# Patient Record
Sex: Female | Born: 1985
Health system: Southern US, Community
[De-identification: ages and names within clinical notes are randomized; demographics above are authoritative.]

## PROBLEM LIST (undated history)

## (undated) ENCOUNTER — Inpatient Hospital Stay (HOSPITAL_COMMUNITY): Payer: Self-pay

## (undated) DIAGNOSIS — E669 Obesity, unspecified: Secondary | ICD-10-CM

## (undated) DIAGNOSIS — N809 Endometriosis, unspecified: Secondary | ICD-10-CM

## (undated) DIAGNOSIS — F32A Depression, unspecified: Secondary | ICD-10-CM

## (undated) DIAGNOSIS — G4730 Sleep apnea, unspecified: Secondary | ICD-10-CM

## (undated) DIAGNOSIS — I10 Essential (primary) hypertension: Secondary | ICD-10-CM

## (undated) DIAGNOSIS — F419 Anxiety disorder, unspecified: Secondary | ICD-10-CM

## (undated) DIAGNOSIS — D219 Benign neoplasm of connective and other soft tissue, unspecified: Secondary | ICD-10-CM

## (undated) DIAGNOSIS — N390 Urinary tract infection, site not specified: Secondary | ICD-10-CM

## (undated) DIAGNOSIS — N83201 Unspecified ovarian cyst, right side: Secondary | ICD-10-CM

## (undated) DIAGNOSIS — D573 Sickle-cell trait: Secondary | ICD-10-CM

## (undated) DIAGNOSIS — Z8742 Personal history of other diseases of the female genital tract: Secondary | ICD-10-CM

## (undated) DIAGNOSIS — Z8619 Personal history of other infectious and parasitic diseases: Secondary | ICD-10-CM

## (undated) HISTORY — DX: Sleep apnea, unspecified: G47.30

## (undated) HISTORY — DX: Obesity, unspecified: E66.9

## (undated) HISTORY — PX: LAPAROTOMY, SALPINGO-OOPHORECTOMY: SHX4643

## (undated) HISTORY — PX: LAPAROSCOPIC, EXCISION, ENDOMETRIOSIS: SHX00263

## (undated) HISTORY — DX: Urinary tract infection, site not specified: N39.0

## (undated) HISTORY — DX: Anxiety disorder, unspecified: F41.9

---

## 2010-05-10 ENCOUNTER — Emergency Department (HOSPITAL_COMMUNITY): Admission: EM | Admit: 2010-05-10 | Discharge: 2010-05-11 | Payer: Self-pay | Admitting: Emergency Medicine

## 2010-12-17 LAB — URINALYSIS, ROUTINE W REFLEX MICROSCOPIC
Bilirubin Urine: NEGATIVE
Glucose, UA: NEGATIVE mg/dL
Hgb urine dipstick: NEGATIVE
Ketones, ur: NEGATIVE mg/dL
Nitrite: NEGATIVE
Protein, ur: NEGATIVE mg/dL
Specific Gravity, Urine: 1.017 (ref 1.005–1.030)
Urobilinogen, UA: 0.2 mg/dL (ref 0.0–1.0)
pH: 5.5 (ref 5.0–8.0)

## 2010-12-17 LAB — POCT I-STAT, CHEM 8
BUN: 14 mg/dL (ref 6–23)
Calcium, Ion: 1.27 mmol/L (ref 1.12–1.32)
Chloride: 104 mEq/L (ref 96–112)
Creatinine, Ser: 0.9 mg/dL (ref 0.4–1.2)
Glucose, Bld: 91 mg/dL (ref 70–99)
HCT: 42 % (ref 36.0–46.0)
Hemoglobin: 14.3 g/dL (ref 12.0–15.0)
Potassium: 3.9 mEq/L (ref 3.5–5.1)
Sodium: 140 mEq/L (ref 135–145)
TCO2: 26 mmol/L (ref 0–100)

## 2011-10-04 HISTORY — PX: LAPAROSCOPIC, SALPINGO-OOPHORECTOMY: SHX5768

## 2012-07-09 ENCOUNTER — Ambulatory Visit: Payer: Self-pay | Admitting: Medical

## 2012-07-13 ENCOUNTER — Encounter: Payer: Self-pay | Admitting: Medical

## 2012-07-13 ENCOUNTER — Ambulatory Visit (INDEPENDENT_AMBULATORY_CARE_PROVIDER_SITE_OTHER): Payer: 59 | Admitting: Medical

## 2012-07-13 VITALS — BP 120/82 | HR 60 | Temp 98.2°F | Resp 16 | Ht 66.0 in | Wt 230.0 lb

## 2012-07-13 DIAGNOSIS — F411 Generalized anxiety disorder: Secondary | ICD-10-CM

## 2012-07-13 DIAGNOSIS — R61 Generalized hyperhidrosis: Secondary | ICD-10-CM

## 2012-07-13 DIAGNOSIS — L74519 Primary focal hyperhidrosis, unspecified: Secondary | ICD-10-CM

## 2012-07-13 DIAGNOSIS — F419 Anxiety disorder, unspecified: Secondary | ICD-10-CM

## 2012-07-13 MED ORDER — CITALOPRAM HYDROBROMIDE 20 MG PO TABS: ORAL_TABLET | ORAL | Status: DC

## 2012-07-13 MED ORDER — ALUMINUM CHLORIDE 20 % EX SOLN: Freq: Every day | CUTANEOUS | Status: DC
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
## 2012-07-13 NOTE — Patient Instructions (Signed)

## 2012-07-19 ENCOUNTER — Encounter: Payer: Self-pay | Admitting: Internal Medicine

## 2012-07-27 ENCOUNTER — Encounter: Payer: Self-pay | Admitting: Medical

## 2012-07-31 ENCOUNTER — Encounter: Payer: Self-pay | Admitting: Medical

## 2012-07-31 ENCOUNTER — Other Ambulatory Visit (HOSPITAL_COMMUNITY)
Admission: RE | Admit: 2012-07-31 | Discharge: 2012-07-31 | Disposition: A | Discharge status: Home / Self Care | Payer: 59 | Source: Ambulatory Visit | Attending: Medical | Admitting: Medical

## 2012-07-31 ENCOUNTER — Ambulatory Visit (INDEPENDENT_AMBULATORY_CARE_PROVIDER_SITE_OTHER): Payer: 59 | Admitting: Medical

## 2012-07-31 VITALS — BP 120/82 | HR 60 | Temp 98.6°F | Resp 16 | Ht 66.0 in | Wt 229.0 lb

## 2012-07-31 DIAGNOSIS — Z124 Encounter for screening for malignant neoplasm of cervix: Secondary | ICD-10-CM

## 2012-07-31 DIAGNOSIS — F411 Generalized anxiety disorder: Secondary | ICD-10-CM

## 2012-07-31 DIAGNOSIS — R61 Generalized hyperhidrosis: Secondary | ICD-10-CM

## 2012-07-31 DIAGNOSIS — R5381 Other malaise: Secondary | ICD-10-CM

## 2012-07-31 DIAGNOSIS — N898 Other specified noninflammatory disorders of vagina: Secondary | ICD-10-CM

## 2012-07-31 DIAGNOSIS — E669 Obesity, unspecified: Secondary | ICD-10-CM

## 2012-07-31 DIAGNOSIS — L74519 Primary focal hyperhidrosis, unspecified: Secondary | ICD-10-CM

## 2012-07-31 DIAGNOSIS — R5383 Other fatigue: Secondary | ICD-10-CM

## 2012-07-31 DIAGNOSIS — Z113 Encounter for screening for infections with a predominantly sexual mode of transmission: Secondary | ICD-10-CM | POA: Insufficient documentation

## 2012-07-31 DIAGNOSIS — Z0000 Encounter for general adult medical examination without abnormal findings: Secondary | ICD-10-CM

## 2012-07-31 DIAGNOSIS — Z01419 Encounter for gynecological examination (general) (routine) without abnormal findings: Secondary | ICD-10-CM | POA: Insufficient documentation

## 2012-07-31 DIAGNOSIS — Z23 Encounter for immunization: Secondary | ICD-10-CM

## 2012-07-31 DIAGNOSIS — F419 Anxiety disorder, unspecified: Secondary | ICD-10-CM

## 2012-07-31 LAB — POCT URINALYSIS DIPSTICK
Bilirubin, UA: NEGATIVE
Blood, UA: NEGATIVE
Glucose, UA: NEGATIVE
Ketones, UA: NEGATIVE
Leukocytes, UA: NEGATIVE
Nitrite, UA: NEGATIVE
Protein, UA: NEGATIVE
Spec Grav, UA: 1.015
Urobilinogen, UA: NEGATIVE
pH, UA: 5

## 2012-07-31 MED ORDER — FLUCONAZOLE 150 MG PO TABS: ORAL_TABLET | ORAL | Status: DC

## 2012-07-31 MED ORDER — SERTRALINE HCL 25 MG PO TABS: ORAL_TABLET | ORAL | Status: DC

## 2012-07-31 NOTE — Patient Instructions (Signed)

## 2012-07-31 NOTE — Progress Notes (Signed)
Subjective:   HPI  Frances Hill is a 26 y.o. female who presents for a complete physical.  Also here for recheck on medications from recent first visit here.  Works as a Child psychotherapist, gets some exercise.    Anxiety - history of poor relationship with father, [facility] my suggestion.  Didn't tolerate citalopram due to lethargy.  She does take B12 in general for fatigue.     Recent white vaginal discharge x 2 wk, thick.  Has hx/o BV and yeast.  Not currently sexually active.  Using nothing for symptoms.   Has been treated various times for both BV and yeast.    LMP 07/11/12.  Periods are regular, no c/o, lasts 3-4 days.  No prior pregnancies.   No current relationship. Has been on Ortho Tri cyclen prior, but no desire to be on OCPs at this time.  Reviewed their medical, surgical, family, social, medication, and allergy history and updated chart as appropriate.  Past Medical History  Diagnosis Date  . Sexually transmitted disease (STD)     hx/o trichomonas  . Anxiety   . UTI (urinary tract infection) 2007    hospitalization  . Hyperhydrosis disorder     axilla  . Bacterial vaginosis     history of    History reviewed. No pertinent past surgical history.  Family History  Problem Relation Age of Onset  . Cancer Mother 40    breast  . Aneurysm Mother 53    died of aneurysm  . Hypertension Father   . Depression Father   . Heart disease Father     valve disease  . Cancer Maternal Aunt     breast cancer  . Diabetes Neg Hx   . Stroke Neg Hx     History   Social History  . Marital Status: Single    Spouse Name: N/A    Number of Children: N/A  . Years of Education: N/A   Occupational History  . Not on file.   Social History Main Topics  . Smoking status: Never Smoker   . Smokeless tobacco: Not on file  . Alcohol Use: 0.6 oz/week    1 Cans of beer per week  . Drug Use: No  . Sexually Active: Not on file   Other Topics  Concern  . 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 aluminum chloride (DRYSOL) 20 % external solution Apply topically at bedtime.  35 mL  3  . sertraline (ZOLOFT) 25 MG tablet 1/2 tablet po daily QHS x 1 wk, then 1 tablet QHS  30 tablet  2    No Known Allergies  Review of Systems Constitutional: -fever, -chills, -sweats, -unexpected weight change, -anorexia, -fatigue Allergy: -sneezing, -itching, -congestion Dermatology: denies changing moles, rash, lumps, new worrisome lesions ENT: -runny nose, -ear pain, -sore throat, -hoarseness, -sinus pain, +teeth pain, -tinnitus, -hearing loss, -epistaxis Cardiology:  -chest pain, -palpitations, -edema, -orthopnea, -paroxysmal nocturnal dyspnea Respiratory: -cough, -shortness of breath, -dyspnea on exertion, -wheezing, -hemoptysis Gastroenterology: -abdominal pain, -nausea, -vomiting, -diarrhea, -constipation, -blood in stool, -changes in bowel movement, -dysphagia Hematology: -bleeding or bruising problems Musculoskeletal: -arthralgias, -myalgias, -joint swelling, -back pain, -neck pain, -cramping, -gait changes Ophthalmology: -vision changes, -eye redness, -itching, -discharge Urology: -dysuria, -difficulty urinating, -hematuria, -urinary frequency, -urgency,  incontinence Neurology: -headache, -weakness, -tingling, -numbness, -speech abnormality, -memory loss, -falls, -dizziness Psychology:  +depressed mood, -agitation, -sleep problems     Objective:   Physical Exam  Filed Vitals:   07/31/12 1426  BP: 120/82  Pulse: 60  Temp: 98.6 F (37 C)  Resp: 16    General appearance: alert, no distress, WD/WN, AA female Skin: unremarkable, no worrisome lesions HEENT: normocephalic, conjunctiva/corneas normal, sclerae anicteric, PERRLA, EOMi, nares  patent, no discharge or erythema, pharynx normal Oral cavity: MMM, tongue normal, teeth normal appearing Neck: supple, no lymphadenopathy, no thyromegaly, no masses, normal ROM, no bruits Chest: non tender, normal shape and expansion Heart: RRR, normal S1, S2, no murmurs Lungs: CTA bilaterally, no wheezes, rhonchi, or rales Abdomen: +bs, soft, non tender, non distended, no masses, no hepatomegaly, no splenomegaly, no bruits Back: non tender, normal ROM, no scoliosis Musculoskeletal: upper extremities non tender, no obvious deformity, normal ROM throughout, lower extremities non tender, no obvious deformity, normal ROM throughout Extremities: no edema, no cyanosis, no clubbing Pulses: 2+ symmetric, upper and lower extremities, normal cap refill Neurological: alert, oriented x 3, CN2-12 intact, strength normal upper extremities and lower extremities, sensation normal throughout, DTRs 2+ throughout, no cerebellar signs, gait normal Psychiatric: normal affect, behavior normal, pleasant  Breast: nontender, no masses or lumps, no skin changes, no nipple discharge or inversion, no axillary lymphadenopathy Gyn: Normal external genitalia without lesions, vagina with normal mucosa, cervix without lesions, no cervical motion tenderness, thick white abnormal vaginal discharge.  Uterus and adnexa not enlarged, nontender, no masses.  Pap performed.  Exam chaperoned by nurse. Rectal: deferred    Assessment and Plan :    Encounter Diagnoses  Name Primary?  . Routine general medical examination at a health care facility Yes  . Anxiety   . Screen for STD (sexually transmitted disease)   . Screening for cervical cancer   . Obesity   . Hyperhydrosis disorder   . Need for HPV vaccination   . Fatigue   . Vaginal discharge    Physical exam - discussed healthy lifestyle, diet, exercise, preventative care, vaccinations, and addressed their concerns.  Handout given.  Up to date on Tdap 2011, declines flu  vaccine.  See dentist yearly.  Anxiety - didn't tolerate citalopram.  Begin trial of Zoloft instead.  Recheck 40mo on Zoloft.  Begins counseling with [facility] tomorrow.  Screen for STD - labs sent, discussed safe sex  Screen for cervical cancer - pap sent  Obesity - discussed lifestyle changes, weight loss recommendations  Hyper hydrosis - improved significantly on Drysol  HPV vaccine given today #1, VIS and counseling given.  fatigue - labs today, increase exercise  Vaginal discharge - wet prep with lots of bacteria, rods, no trichomonads or obvious clue cells.  There is yeast though.  We will treat for yeast infection.  Follow-up pending labs

## 2012-08-01 LAB — CBC WITH DIFFERENTIAL/PLATELET
Basophils Absolute: 0 10*3/uL (ref 0.0–0.1)
Basophils Relative: 0 % (ref 0–1)
Eosinophils Absolute: 0 10*3/uL (ref 0.0–0.7)
Eosinophils Relative: 0 % (ref 0–5)
HCT: 39.6 % (ref 36.0–46.0)
Hemoglobin: 13.4 g/dL (ref 12.0–15.0)
Lymphocytes Relative: 30 % (ref 12–46)
Lymphs Abs: 2.1 10*3/uL (ref 0.7–4.0)
MCH: 25.6 pg — ABNORMAL LOW (ref 26.0–34.0)
MCHC: 33.8 g/dL (ref 30.0–36.0)
MCV: 75.7 fL — ABNORMAL LOW (ref 78.0–100.0)
Monocytes Absolute: 0.7 10*3/uL (ref 0.1–1.0)
Monocytes Relative: 10 % (ref 3–12)
Neutro Abs: 4.1 10*3/uL (ref 1.7–7.7)
Neutrophils Relative %: 60 % (ref 43–77)
Platelets: 312 10*3/uL (ref 150–400)
RBC: 5.23 MIL/uL — ABNORMAL HIGH (ref 3.87–5.11)
RDW: 14.1 % (ref 11.5–15.5)
WBC: 6.9 10*3/uL (ref 4.0–10.5)

## 2012-08-01 LAB — VITAMIN B12
Vitamin B-12: 405 pg/mL (ref 211–911)
Vitamin B-12: 419 pg/mL (ref 211–911)

## 2012-08-01 LAB — COMPREHENSIVE METABOLIC PANEL
ALT: 11 U/L (ref 0–35)
AST: 15 U/L (ref 0–37)
Albumin: 4.1 g/dL (ref 3.5–5.2)
Alkaline Phosphatase: 52 U/L (ref 39–117)
BUN: 9 mg/dL (ref 6–23)
CO2: 24 mEq/L (ref 19–32)
Calcium: 9.5 mg/dL (ref 8.4–10.5)
Chloride: 103 mEq/L (ref 96–112)
Creat: 0.82 mg/dL (ref 0.50–1.10)
Glucose, Bld: 74 mg/dL (ref 70–99)
Potassium: 4.2 mEq/L (ref 3.5–5.3)
Sodium: 135 mEq/L (ref 135–145)
Total Bilirubin: 0.4 mg/dL (ref 0.3–1.2)
Total Protein: 7 g/dL (ref 6.0–8.3)

## 2012-08-01 LAB — LIPID PANEL
Cholesterol: 168 mg/dL (ref 0–200)
HDL: 46 mg/dL (ref >39)
LDL Cholesterol: 107 mg/dL — ABNORMAL HIGH (ref 0–99)
Total CHOL/HDL Ratio: 3.7 Ratio
Triglycerides: 73 mg/dL (ref <150)
VLDL: 15 mg/dL (ref 0–40)

## 2012-08-01 LAB — TSH: TSH: 1.394 u[IU]/mL (ref 0.350–4.500)

## 2012-08-01 LAB — RPR

## 2012-08-01 LAB — HIV ANTIBODY (ROUTINE TESTING W REFLEX): HIV: NONREACTIVE

## 2012-12-04 ENCOUNTER — Ambulatory Visit (INDEPENDENT_AMBULATORY_CARE_PROVIDER_SITE_OTHER): Payer: 59 | Admitting: Family Medicine

## 2012-12-04 ENCOUNTER — Encounter: Payer: Self-pay | Admitting: Family Medicine

## 2012-12-04 VITALS — BP 124/80 | HR 84 | Temp 98.5°F | Wt 234.0 lb

## 2012-12-04 DIAGNOSIS — N912 Amenorrhea, unspecified: Secondary | ICD-10-CM

## 2012-12-04 DIAGNOSIS — A088 Other specified intestinal infections: Secondary | ICD-10-CM

## 2012-12-04 DIAGNOSIS — A084 Viral intestinal infection, unspecified: Secondary | ICD-10-CM

## 2012-12-04 LAB — HCG, SERUM, QUALITATIVE: Preg, Serum: NEGATIVE

## 2012-12-04 MED ORDER — ONDANSETRON HCL 4 MG PO TABS: 4.0000 mg | ORAL_TABLET | Freq: Three times a day (TID) | ORAL | Status: DC | PRN

## 2012-12-04 NOTE — Patient Instructions (Addendum)
First try Emetrol and use this as needed and if that doesn't work did get the prescription filled For the diarrhea he use Imodium which is over-the-counter.

## 2012-12-04 NOTE — Progress Notes (Signed)
  Subjective:    Patient ID: Frances Hill, female    DOB: 1985-10-11, 27 y.o.   MRN: [id number]  HPI She has a three-day history of nausea, vomiting x8, diarrhea,2/3 her day. No fever or chills, abdominal pain. No one else around her has had any illnesses. She cannot relate this to any particular food or places she has eaten. She has not tried anything to treat this. She is concerned over possible pregnancy. Her last menstrual cycle was one month ago . No birth control was used.   Review of Systems     Objective:   Physical Exam alert and in no distress. Tympanic membranes and canals are normal. Throat is clear. Tonsils are normal. Neck is supple without adenopathy or thyromegaly. Cardiac exam shows a regular sinus rhythm without murmurs or gallops. Lungs are clear to auscultation.        Assessment & Plan:  Viral gastroenteritis - Plan: ondansetron (ZOFRAN) 4 MG tablet  Amenorrhea - Plan: hCG, serum, qualitative  Recommend she try Emetrol first and then if no benefit use Zofran. Discussed the need for using some form of birth control.

## 2012-12-05 NOTE — Progress Notes (Signed)
Quick Note:  Called pt only # left message that test was neg. ______

## 2013-08-25 ENCOUNTER — Emergency Department (HOSPITAL_COMMUNITY)
Admission: EM | Admit: 2013-08-25 | Discharge: 2013-08-26 | Disposition: A | Discharge status: Home / Self Care | Payer: 59 | Attending: Emergency Medicine | Admitting: Emergency Medicine

## 2013-08-25 ENCOUNTER — Encounter (HOSPITAL_COMMUNITY): Payer: Self-pay | Admitting: Emergency Medicine

## 2013-08-25 DIAGNOSIS — R197 Diarrhea, unspecified: Secondary | ICD-10-CM | POA: Insufficient documentation

## 2013-08-25 DIAGNOSIS — B373 Candidiasis of vulva and vagina: Secondary | ICD-10-CM | POA: Insufficient documentation

## 2013-08-25 DIAGNOSIS — Z3202 Encounter for pregnancy test, result negative: Secondary | ICD-10-CM | POA: Insufficient documentation

## 2013-08-25 DIAGNOSIS — Z8619 Personal history of other infectious and parasitic diseases: Secondary | ICD-10-CM | POA: Insufficient documentation

## 2013-08-25 DIAGNOSIS — B3731 Acute candidiasis of vulva and vagina: Secondary | ICD-10-CM | POA: Insufficient documentation

## 2013-08-25 DIAGNOSIS — F411 Generalized anxiety disorder: Secondary | ICD-10-CM | POA: Insufficient documentation

## 2013-08-25 DIAGNOSIS — Z87440 Personal history of urinary (tract) infections: Secondary | ICD-10-CM | POA: Insufficient documentation

## 2013-08-25 DIAGNOSIS — R112 Nausea with vomiting, unspecified: Secondary | ICD-10-CM | POA: Insufficient documentation

## 2013-08-25 LAB — URINALYSIS, ROUTINE W REFLEX MICROSCOPIC
Bilirubin Urine: NEGATIVE
Glucose, UA: NEGATIVE mg/dL
Hgb urine dipstick: NEGATIVE
Ketones, ur: NEGATIVE mg/dL
Leukocytes, UA: NEGATIVE
Nitrite: NEGATIVE
Protein, ur: NEGATIVE mg/dL
Specific Gravity, Urine: 1.018 (ref 1.005–1.030)
Urobilinogen, UA: 0.2 mg/dL (ref 0.0–1.0)
pH: 8 (ref 5.0–8.0)

## 2013-08-25 LAB — POCT PREGNANCY, URINE: Preg Test, Ur: NEGATIVE

## 2013-08-25 MED ORDER — ONDANSETRON 4 MG PO TBDP
8.0000 mg | ORAL_TABLET | Freq: Once | ORAL | Status: AC
  Administered 2013-08-25: 8 mg via ORAL

## 2013-08-25 NOTE — ED Notes (Addendum)
Presents with onset of cramping lower abdominal pain began this am associated with nausea. Sitting up makes pain worse, fetal position makes pain better. Pain is described as sharp and cramping. LMP 10/22, denies vaginal bleeding and vaginal discharge, dneis urinary symptoms. Pt is tearful. Pain 10/10.  Took home pregnancy tests that were both negative today.

## 2013-08-26 LAB — WET PREP, GENITAL
Trich, Wet Prep: NONE SEEN
Yeast Wet Prep HPF POC: NONE SEEN

## 2013-08-26 MED ORDER — KETOROLAC TROMETHAMINE 30 MG/ML IJ SOLN
60.0000 mg | Freq: Once | INTRAMUSCULAR | Status: AC
  Administered 2013-08-26: 60 mg via INTRAMUSCULAR
  Filled 2013-08-26: qty 2

## 2013-08-26 MED ORDER — ONDANSETRON 8 MG PO TBDP: 8.0000 mg | ORAL_TABLET | Freq: Three times a day (TID) | ORAL | Status: DC | PRN

## 2013-08-26 MED ORDER — FLUCONAZOLE 100 MG PO TABS
150.0000 mg | ORAL_TABLET | Freq: Once | ORAL | Status: AC
  Administered 2013-08-26: 150 mg via ORAL
  Filled 2013-08-26: qty 2

## 2013-08-26 MED ORDER — TRAMADOL HCL 50 MG PO TABS: 50.0000 mg | ORAL_TABLET | Freq: Four times a day (QID) | ORAL | Status: DC | PRN

## 2013-08-26 MED ORDER — PROMETHAZINE HCL 25 MG PO TABS: 25.0000 mg | ORAL_TABLET | Freq: Four times a day (QID) | ORAL | Status: DC | PRN

## 2013-08-26 MED ORDER — PROMETHAZINE HCL 25 MG/ML IJ SOLN
25.0000 mg | Freq: Once | INTRAMUSCULAR | Status: AC
  Administered 2013-08-26: 25 mg via INTRAMUSCULAR
  Filled 2013-08-26: qty 1

## 2013-08-26 MED ORDER — PROMETHAZINE HCL 25 MG RE SUPP: 25.0000 mg | Freq: Four times a day (QID) | RECTAL | Status: DC | PRN

## 2013-08-26 NOTE — ED Provider Notes (Signed)
CSN: [id number]     Arrival date & time 08/25/13  2018 History   First MD Initiated Contact with Patient 08/25/13 2329     Chief Complaint  Patient presents with  . Abdominal Pain   (Consider location/radiation/quality/duration/timing/severity/associated sxs/prior Treatment) HPI 27 year old female presents emergency department with complaint of nausea, vomiting, and diarrhea, along with lower abdominal cramping.  Patient reports she is due for her period any day.  She is taken home pregnancy tests may have been negative.  No fevers no chills.  Patient, reports she's been unable to keep anything down.  No sick contacts.  No travel, no unusual foods.  She reports thick white discharge with slight itching.  No new sexual partners. Past Medical History  Diagnosis Date  . Sexually transmitted disease (STD)     hx/o trichomonas  . Anxiety   . UTI (urinary tract infection) 2007    hospitalization  . Hyperhydrosis disorder     axilla  . Bacterial vaginosis     history of   History reviewed. No pertinent past surgical history. Family History  Problem Relation Age of Onset  . Cancer Mother 63    breast  . Aneurysm Mother 69    died of aneurysm  . Hypertension Father   . Depression Father   . Heart disease Father     valve disease  . Cancer Maternal Aunt     breast cancer  . Diabetes Neg Hx   . Stroke Neg Hx    History  Substance Use Topics  . Smoking status: Never Smoker   . Smokeless tobacco: Not on file  . Alcohol Use: 0.6 oz/week    1 Cans of beer per week   OB History   Grav Para Term Preterm Abortions TAB SAB Ect Mult Living                 Review of Systems  All other systems reviewed and are negative.    Allergies  Review of patient's allergies indicates no known allergies.  Home Medications   Current Outpatient Rx  Name  Route  Sig  Dispense  Refill  . Naproxen Sodium (ALEVE) 220 MG CAPS   Oral   Take 220 mg by mouth every 6 (six) hours as needed  (PAIN).         Marland Kitchen ondansetron (ZOFRAN) 4 MG tablet   Oral   Take 1 tablet (4 mg total) by mouth every 8 (eight) hours as needed for nausea.   12 tablet   0   . ondansetron (ZOFRAN ODT) 8 MG disintegrating tablet   Oral   Take 1 tablet (8 mg total) by mouth every 8 (eight) hours as needed for nausea or vomiting.   20 tablet   0   . promethazine (PHENERGAN) 25 MG suppository   Rectal   Place 1 suppository (25 mg total) rectally every 6 (six) hours as needed for nausea or vomiting.   12 each   0   . promethazine (PHENERGAN) 25 MG tablet   Oral   Take 1 tablet (25 mg total) by mouth every 6 (six) hours as needed for nausea.   30 tablet   0   . traMADol (ULTRAM) 50 MG tablet   Oral   Take 1 tablet (50 mg total) by mouth every 6 (six) hours as needed for moderate pain.   15 tablet   0    BP 135/82  Pulse 82  Temp(Src) 99.6 F (37.6 C) (Oral)  Resp 18  Wt 245 lb 14.4 oz (111.54 kg)  SpO2 100% Physical Exam  Nursing note and vitals reviewed. Constitutional: She is oriented to person, place, and time. She appears well-developed and well-nourished.  HENT:  Head: Normocephalic and atraumatic.  Nose: Nose normal.  Mouth/Throat: Oropharynx is clear and moist.  Eyes: Conjunctivae and EOM are normal. Pupils are equal, round, and reactive to light.  Neck: Normal range of motion. Neck supple. No JVD present. No tracheal deviation present. No thyromegaly present.  Cardiovascular: Normal rate, regular rhythm, normal heart sounds and intact distal pulses.  Exam reveals no gallop and no friction rub.   No murmur heard. Pulmonary/Chest: Effort normal and breath sounds normal. No stridor. No respiratory distress. She has no wheezes. She has no rales. She exhibits no tenderness.  Abdominal: Soft. Bowel sounds are normal. She exhibits no distension and no mass. There is tenderness (mild suprapubic and lower pelvic tenderness with palpation). There is no rebound and no guarding.   Genitourinary:  External genitalia normal Vagina with thick, white, adherent discharge Cervix closed no lesions No cervical motion tenderness Adnexa palpated, no masses or tenderness noted Bladder palpated no tenderness Uterus palpated no masses but moderate tenderness    Musculoskeletal: Normal range of motion. She exhibits no edema and no tenderness.  Lymphadenopathy:    She has no cervical adenopathy.  Neurological: She is alert and oriented to person, place, and time. She exhibits normal muscle tone. Coordination normal.  Skin: Skin is warm and dry. No rash noted. No erythema. No pallor.  Psychiatric: She has a normal mood and affect. Her behavior is normal. Judgment and thought content normal.    ED Course  Procedures (including critical care time) Labs Review Labs Reviewed  WET PREP, GENITAL  GC/CHLAMYDIA PROBE AMP  URINALYSIS, ROUTINE W REFLEX MICROSCOPIC  POCT PREGNANCY, URINE   Imaging Review No results found.  EKG Interpretation   None       MDM   1. Nausea vomiting and diarrhea   2. Vaginal candidiasis    27 year old female with nausea, vomiting, and diarrhea.  This maybe do to a viral gastroenteritis.  Clinically, she has a yeast infection, will treat with Diflucan. Samples are currently misplaced, but they should be be able to recover them for GC and Chlamydia.  Patient has tolerated crackers and ginger ale here.  We'll discharge home to followup with PCP    Olivia Mackie, MD 08/26/13 [phone]

## 2013-08-27 LAB — GC/CHLAMYDIA PROBE AMP
CT Probe RNA: NEGATIVE
GC Probe RNA: NEGATIVE

## 2013-09-16 ENCOUNTER — Ambulatory Visit (INDEPENDENT_AMBULATORY_CARE_PROVIDER_SITE_OTHER): Payer: 59 | Admitting: Medical

## 2013-09-16 ENCOUNTER — Encounter: Payer: Self-pay | Admitting: Medical

## 2013-09-16 VITALS — BP 110/80 | HR 78 | Temp 98.2°F | Resp 18 | Ht 66.0 in | Wt 243.0 lb

## 2013-09-16 DIAGNOSIS — N898 Other specified noninflammatory disorders of vagina: Secondary | ICD-10-CM

## 2013-09-16 DIAGNOSIS — Z0000 Encounter for general adult medical examination without abnormal findings: Secondary | ICD-10-CM

## 2013-09-16 DIAGNOSIS — Z803 Family history of malignant neoplasm of breast: Secondary | ICD-10-CM

## 2013-09-16 DIAGNOSIS — E669 Obesity, unspecified: Secondary | ICD-10-CM

## 2013-09-16 DIAGNOSIS — Z23 Encounter for immunization: Secondary | ICD-10-CM

## 2013-09-16 DIAGNOSIS — M2550 Pain in unspecified joint: Secondary | ICD-10-CM

## 2013-09-16 DIAGNOSIS — Z309 Encounter for contraceptive management, unspecified: Secondary | ICD-10-CM

## 2013-09-16 DIAGNOSIS — Z113 Encounter for screening for infections with a predominantly sexual mode of transmission: Secondary | ICD-10-CM

## 2013-09-16 LAB — POCT URINALYSIS DIPSTICK
Bilirubin, UA: NEGATIVE
Blood, UA: NEGATIVE
Glucose, UA: NEGATIVE
Ketones, UA: NEGATIVE
Leukocytes, UA: NEGATIVE
Nitrite, UA: NEGATIVE
Protein, UA: NEGATIVE
Spec Grav, UA: <=1.005
Urobilinogen, UA: NEGATIVE
pH, UA: 5

## 2013-09-16 LAB — POCT WET PREP (WET MOUNT)
Clue Cells Wet Prep Whiff POC: NEGATIVE
KOH Wet Prep POC: NEGATIVE
Trichomonas Wet Prep HPF POC: NEGATIVE

## 2013-09-16 LAB — BASIC METABOLIC PANEL
BUN: 11 mg/dL (ref 6–23)
CO2: 27 mEq/L (ref 19–32)
Calcium: 10 mg/dL (ref 8.4–10.5)
Chloride: 101 mEq/L (ref 96–112)
Creat: 0.88 mg/dL (ref 0.50–1.10)
Glucose, Bld: 87 mg/dL (ref 70–99)
Potassium: 4 mEq/L (ref 3.5–5.3)
Sodium: 133 mEq/L — ABNORMAL LOW (ref 135–145)

## 2013-09-16 LAB — HEMOGLOBIN A1C
Hgb A1c MFr Bld: 5.8 % — ABNORMAL HIGH (ref <5.7)
Mean Plasma Glucose: 120 mg/dL — ABNORMAL HIGH (ref <117)

## 2013-09-16 LAB — RPR

## 2013-09-16 LAB — CBC
HCT: 41 % (ref 36.0–46.0)
Hemoglobin: 13.7 g/dL (ref 12.0–15.0)
MCH: 24.9 pg — ABNORMAL LOW (ref 26.0–34.0)
MCHC: 33.4 g/dL (ref 30.0–36.0)
MCV: 74.4 fL — ABNORMAL LOW (ref 78.0–100.0)
Platelets: 344 10*3/uL (ref 150–400)
RBC: 5.51 MIL/uL — ABNORMAL HIGH (ref 3.87–5.11)
RDW: 14.5 % (ref 11.5–15.5)
WBC: 5.5 10*3/uL (ref 4.0–10.5)

## 2013-09-16 LAB — POCT URINE PREGNANCY: Preg Test, Ur: NEGATIVE

## 2013-09-16 MED ORDER — NORGESTIM-ETH ESTRAD TRIPHASIC 0.18/0.215/0.25 MG-25 MCG PO TABS: 1.0000 | ORAL_TABLET | Freq: Every day | ORAL | Status: DC

## 2013-09-16 NOTE — Addendum Note (Signed)
Addended by: Leretha Dykes L on: 09/16/2013 12:40 PM   Modules accepted: Orders

## 2013-09-16 NOTE — Progress Notes (Signed)
Subjective:   HPI  Frances Hill is a 27 y.o. female who presents for a complete physical.  Preventative care: Last ophthalmology visit:n/a Last dental visit:n/a Last colonoscopy:n/a Last mammogram:n/a Last gynecological exam:2013 Last EKG:n/a Last labs2014  Prior vaccinations: TD or Tdap:no vaccince: <10 years Influenza:no vaccine today, declines Pneumococcal:n/a Shingles/Zostavax:n/a  Advanced directive:n/a Health care power of attorney:n/a Living will:n/a  Concerns: Not on birth control.   Considering this.  Has been on ortho tri cyclen lo in the past.  Has used nuvaring but this was painful.  Doesn't want to use this again.  No other hormonal contraception.  LMP about a month ago, due.  Periods regular.  First day typically heavy, otherwise fine.  Using condoms most of the time.  In relationship x 7 mo.    Wants STD screening.  She also notes chronic yeast infection.  Having some pains of her left 2nd toe and right index finger x a week or so.   No injury, trauma, or change in activity.  Reviewed their medical, surgical, family, social, medication, and allergy history and updated chart as appropriate.  Past Medical History  Diagnosis Date  . Sexually transmitted disease (STD)     hx/o trichomonas  . Anxiety   . UTI (urinary tract infection) 2007    hospitalization  . Hyperhydrosis disorder     axilla  . Bacterial vaginosis     history of  . Obesity     History reviewed. No pertinent past surgical history.  History   Social History  . Marital Status: Single    Spouse Name: N/A    Number of Children: N/A  . Years of Education: N/A   Occupational History  . Not on file.   Social History Main Topics  . Smoking status: Never Smoker   . Smokeless tobacco: Not on file  . Alcohol Use: 0.6 oz/week    1 Cans of beer per week  . Drug Use: No  . Sexual Activity: Not on file   Other Topics Concern  . Not on file   Social History Narrative   Frances Hill,  lives alone, no children, Child psychotherapist, in a relationship x 7 mo, exercise - none as of 09/2013    Family History  Problem Relation Age of Onset  . Aneurysm Mother 42    died of aneurysm  . Cancer Mother 78    breast, twice  . Hypertension Father   . Depression Father   . Heart disease Father     valve disease  . Cancer Maternal Aunt     breast cancer  . Diabetes Neg Hx   . Stroke Neg Hx     No current outpatient prescriptions on file.  No Known Allergies   Review of Systems Constitutional: -fever, -chills, -sweats, -unexpected weight change, -decreased appetite, -fatigue Allergy: -sneezing, -itching, -congestion Dermatology: -changing moles, --rash, -lumps ENT: -runny nose, -ear pain, -sore throat, -hoarseness, -sinus pain, -teeth pain, - ringing in ears, -hearing loss, -nosebleeds Cardiology: -chest pain, -palpitations, -swelling, -difficulty breathing when lying flat, -waking up short of breath Respiratory: -cough, -shortness of breath, -difficulty breathing with exercise or exertion, -wheezing, -coughing up blood Gastroenterology: -abdominal pain, -nausea, -vomiting, -diarrhea, -constipation, -blood in stool, -changes in bowel movement, -difficulty swallowing or eating Hematology: -bleeding, -bruising  Musculoskeletal: +joint aches, -muscle aches, -joint swelling, -back pain, -neck pain, -cramping, -changes in gait Ophthalmology: denies vision changes, eye redness, itching, discharge Urology: -burning with urination, -difficulty urinating, -blood in urine, -urinary frequency, -urgency, -incontinence  Neurology: -headache, -weakness, -tingling, -numbness, -memory loss, -falls, -dizziness Psychology: -depressed mood, -agitation, -sleep problems     Objective:   Physical Exam  BP 110/80  Pulse 78  Temp(Src) 98.2 F (36.8 C) (Oral)  Resp 18  Ht 5\' 6"  (1.676 m)  Wt 243 lb (110.224 kg)  BMI 39.24 kg/m2  LMP 08/28/2013  General appearance:pleasant, alert, no  distress, WD/WN, obese, AA female  Skin: unremarkable, no worrisome lesions  HEENT: normocephalic, conjunctiva/corneas normal, sclerae anicteric, PERRLA, EOMi, nares patent, no discharge or erythema, pharynx normal  Oral cavity: MMM, tongue normal, teeth normal appearing  Neck: supple, no lymphadenopathy, no thyromegaly, no masses, normal ROM, no bruits  Chest: non tender, normal shape and expansion  Heart: RRR, normal S1, S2, no murmurs  Lungs: CTA bilaterally, no wheezes, rhonchi, or rales  Abdomen: +bs, soft, non tender, non distended, no masses, no hepatomegaly, no splenomegaly, no bruits  Back: non tender, normal ROM, no scoliosis  Musculoskeletal: upper extremities non tender, no obvious deformity, normal ROM throughout, lower extremities non tender, no obvious deformity, normal ROM throughout  Extremities: no edema, no cyanosis, no clubbing  Pulses: 2+ symmetric, upper and lower extremities, normal cap refill  Neurological: alert, oriented x 3, CN2-12 intact, strength normal upper extremities and lower extremities, sensation normal throughout, DTRs 2+ throughout, no cerebellar signs, gait normal  Psychiatric: normal affect, behavior normal, pleasant  Breast: nontender, no masses or lumps, no skin changes, no nipple discharge or inversion, no axillary lymphadenopathy  Gyn: Normal external genitalia without lesions, vagina with normal mucosa, cervix without lesions, no cervical motion tenderness, thick white abnormal vaginal discharge. Uterus and adnexa not enlarged, nontender, no masses. Swabs taken. Exam chaperoned by nurse.  Rectal: deferred   KOH with lots of yeast.  Otherwise wet prep unremarkable.   Urine pregnancy negative.    Assessment and Plan :    Encounter Diagnoses  Name Primary?  . Routine general medical examination at a health care facility Yes  . Need for HPV vaccination   . Obesity, unspecified   . Contraception management   . Screen for STD (sexually  transmitted disease)   . Vaginal discharge   . Family history of breast cancer   . Arthralgia     Physical exam - discussed healthy lifestyle, diet, exercise, preventative care, vaccinations, and addressed their concerns.  Handout given. See dentist for routine screening and hygiene visit.  HPV #2 given.   Return in 4 months for last dose. Obesity - discussed need for healthy diet, more water intake, exercise, and weight loss Contraception management - discussed risks of OCPs, benefits, options for contraception, proper use, urine pregnancy negative. Begin ortho tri cyclen lo.   STD screening today.  Discussed safe sex. Vaginal discharge - will treat for yeast infection Screening mammogram for baseline given mom and maternal aunt's history of breast cancer., Arthralgia - pending labs.  Begin routine exercise.  Follow-up pending labs.

## 2013-09-16 NOTE — Patient Instructions (Addendum)
Recommendations today:  See dentist for routine evaluation and hygiene visit.  I recommend exercising most days of the week using a type of exercise that they would enjoy and stick to such as walking, running, swimming, hiking, biking, aerobics, etc.  I recommend a healthy diet.    Do's:   whole grains such as whole grain pasta, rice, whole grains breads and whole grain cereals.  Use small quantities such as 1/2 cup per serving or 2 slices of bread per serving.    Eat 3-5 fruits daily  Eat beans at least once daily  Eat almonds in small quantities at least 3 days per week    If they eat meat, I recommend small portions of lean meats such as chicken, fish, and [place].  Eat as much NON corn and NON potato vegetables as they like, particularly raw or steamed  Drink several large glasses of water daily  Cautions:  Limit red meat  Limit corn and potatoes  Limit sweets, cake, pie, candy  Limit beer and alcohol  Avoid fried food, fast food, large portions  Avoid sugary drinks such as regular soda and sweet tea  Begin Ortho Tri Cyclen Lo on the Sunday of your next period.

## 2013-09-17 ENCOUNTER — Other Ambulatory Visit: Payer: Self-pay | Admitting: Medical

## 2013-09-17 LAB — GC/CHLAMYDIA PROBE AMP
CT Probe RNA: NEGATIVE
GC Probe RNA: NEGATIVE

## 2013-09-17 LAB — HIV ANTIBODY (ROUTINE TESTING W REFLEX): HIV: NONREACTIVE

## 2013-09-17 MED ORDER — FLUCONAZOLE 150 MG PO TABS: ORAL_TABLET | ORAL | Status: DC

## 2013-12-05 ENCOUNTER — Emergency Department (HOSPITAL_COMMUNITY)
Admission: EM | Admit: 2013-12-05 | Discharge: 2013-12-05 | Disposition: A | Discharge status: Home / Self Care | Payer: 59 | Attending: Emergency Medicine | Admitting: Emergency Medicine

## 2013-12-05 ENCOUNTER — Telehealth: Payer: Self-pay | Admitting: Medical

## 2013-12-05 ENCOUNTER — Emergency Department (HOSPITAL_COMMUNITY): Payer: 59

## 2013-12-05 ENCOUNTER — Encounter (HOSPITAL_COMMUNITY): Payer: Self-pay | Admitting: Emergency Medicine

## 2013-12-05 DIAGNOSIS — Z3202 Encounter for pregnancy test, result negative: Secondary | ICD-10-CM | POA: Insufficient documentation

## 2013-12-05 DIAGNOSIS — N83209 Unspecified ovarian cyst, unspecified side: Secondary | ICD-10-CM | POA: Insufficient documentation

## 2013-12-05 DIAGNOSIS — R110 Nausea: Secondary | ICD-10-CM | POA: Insufficient documentation

## 2013-12-05 DIAGNOSIS — R109 Unspecified abdominal pain: Secondary | ICD-10-CM

## 2013-12-05 DIAGNOSIS — D259 Leiomyoma of uterus, unspecified: Secondary | ICD-10-CM | POA: Insufficient documentation

## 2013-12-05 LAB — URINALYSIS, ROUTINE W REFLEX MICROSCOPIC
Bilirubin Urine: NEGATIVE
Glucose, UA: NEGATIVE mg/dL
Hgb urine dipstick: NEGATIVE
Ketones, ur: NEGATIVE mg/dL
Nitrite: NEGATIVE
Protein, ur: NEGATIVE mg/dL
Specific Gravity, Urine: 1.013 (ref 1.005–1.030)
Urobilinogen, UA: 0.2 mg/dL (ref 0.0–1.0)
pH: 6.5 (ref 5.0–8.0)

## 2013-12-05 LAB — COMPREHENSIVE METABOLIC PANEL
ALT: 15 U/L (ref 0–35)
AST: 17 U/L (ref 0–37)
Albumin: 3.6 g/dL (ref 3.5–5.2)
Alkaline Phosphatase: 69 U/L (ref 39–117)
BUN: 8 mg/dL (ref 6–23)
CO2: 24 mEq/L (ref 19–32)
Calcium: 9.6 mg/dL (ref 8.4–10.5)
Chloride: 100 mEq/L (ref 96–112)
Creatinine, Ser: 0.85 mg/dL (ref 0.50–1.10)
GFR calc Af Amer: >90 mL/min (ref >90)
GFR calc non Af Amer: >90 mL/min (ref >90)
Glucose, Bld: 108 mg/dL — ABNORMAL HIGH (ref 70–99)
Potassium: 3.7 mEq/L (ref 3.7–5.3)
Sodium: 137 mEq/L (ref 137–147)
Total Bilirubin: 0.2 mg/dL — ABNORMAL LOW (ref 0.3–1.2)
Total Protein: 7.9 g/dL (ref 6.0–8.3)

## 2013-12-05 LAB — LIPASE, BLOOD: Lipase: 17 U/L (ref 11–59)

## 2013-12-05 LAB — CBC WITH DIFFERENTIAL/PLATELET
Basophils Absolute: 0 10*3/uL (ref 0.0–0.1)
Basophils Relative: 0 % (ref 0–1)
Eosinophils Absolute: 0.1 10*3/uL (ref 0.0–0.7)
Eosinophils Relative: 2 % (ref 0–5)
HCT: 36 % (ref 36.0–46.0)
Hemoglobin: 11.7 g/dL — ABNORMAL LOW (ref 12.0–15.0)
Lymphocytes Relative: 35 % (ref 12–46)
Lymphs Abs: 2.6 10*3/uL (ref 0.7–4.0)
MCH: 24.2 pg — ABNORMAL LOW (ref 26.0–34.0)
MCHC: 32.5 g/dL (ref 30.0–36.0)
MCV: 74.5 fL — ABNORMAL LOW (ref 78.0–100.0)
Monocytes Absolute: 0.7 10*3/uL (ref 0.1–1.0)
Monocytes Relative: 9 % (ref 3–12)
Neutro Abs: 4 10*3/uL (ref 1.7–7.7)
Neutrophils Relative %: 54 % (ref 43–77)
Platelets: 324 10*3/uL (ref 150–400)
RBC: 4.83 MIL/uL (ref 3.87–5.11)
RDW: 13.8 % (ref 11.5–15.5)
WBC: 7.4 10*3/uL (ref 4.0–10.5)

## 2013-12-05 LAB — URINE MICROSCOPIC-ADD ON

## 2013-12-05 LAB — PREGNANCY, URINE: Preg Test, Ur: NEGATIVE

## 2013-12-05 MED ORDER — SODIUM CHLORIDE 0.9 % IV SOLN
INTRAVENOUS | Status: DC
  Administered 2013-12-05: 04:00:00 via INTRAVENOUS

## 2013-12-05 MED ORDER — HYDROCODONE-ACETAMINOPHEN 5-325 MG PO TABS: 1.0000 | ORAL_TABLET | Freq: Four times a day (QID) | ORAL | Status: DC | PRN

## 2013-12-05 MED ORDER — ONDANSETRON HCL 4 MG/2ML IJ SOLN
4.0000 mg | Freq: Once | INTRAMUSCULAR | Status: AC
  Administered 2013-12-05: 4 mg via INTRAVENOUS
  Filled 2013-12-05: qty 2

## 2013-12-05 MED ORDER — ONDANSETRON HCL 4 MG PO TABS: 4.0000 mg | ORAL_TABLET | Freq: Four times a day (QID) | ORAL | Status: DC

## 2013-12-05 MED ORDER — FENTANYL CITRATE 0.05 MG/ML IJ SOLN
50.0000 ug | Freq: Once | INTRAMUSCULAR | Status: AC
  Administered 2013-12-05: 50 ug via INTRAVENOUS
  Filled 2013-12-05: qty 2

## 2013-12-05 MED ORDER — HYDROMORPHONE HCL PF 1 MG/ML IJ SOLN
1.0000 mg | Freq: Once | INTRAMUSCULAR | Status: AC
  Administered 2013-12-05: 1 mg via INTRAVENOUS
  Filled 2013-12-05: qty 1

## 2013-12-05 NOTE — ED Notes (Signed)
US at bedside at this time 

## 2013-12-05 NOTE — ED Provider Notes (Signed)
CSN: [id number]     Arrival date & time 12/05/13  0126 History   First MD Initiated Contact with Patient 12/05/13 0319     Chief Complaint  Patient presents with  . Abdominal Pain     (Consider location/radiation/quality/duration/timing/severity/associated sxs/prior Treatment) HPI History provided by patient. Left-sided abdominal pain on off for last week has been progressively worsening and now is constant. Has some nausea but no vomiting. Some painful bowel movements but no blood in stools. No constipation or diarrhea. No associated fevers or chills. No known sick contacts. No recent travel. No history of similar symptoms in the past. No hematuria or dysuria.   Past Medical History  Diagnosis Date  . Sexually transmitted disease (STD)     hx/o trichomonas  . Anxiety   . UTI (urinary tract infection) 2007    hospitalization  . Hyperhydrosis disorder     axilla  . Bacterial vaginosis     history of  . Obesity    History reviewed. No pertinent past surgical history. Family History  Problem Relation Age of Onset  . Aneurysm Mother 65    died of aneurysm  . Cancer Mother 75    breast, twice  . Hypertension Father   . Depression Father   . Heart disease Father     valve disease  . Cancer Maternal Aunt     breast cancer  . Diabetes Neg Hx   . Stroke Neg Hx    History  Substance Use Topics  . Smoking status: Never Smoker   . Smokeless tobacco: Not on file  . Alcohol Use: 0.6 oz/week    1 Cans of beer per week   OB History   Grav Para Term Preterm Abortions TAB SAB Ect Mult Living                 Review of Systems  Constitutional: Negative for fever and chills.  Respiratory: Negative for shortness of breath.   Gastrointestinal: Positive for abdominal pain. Negative for vomiting.  Genitourinary: Positive for flank pain. Negative for dysuria and hematuria.  Musculoskeletal: Negative for neck pain.  Skin: Negative for rash.  Neurological: Negative for headaches.   All other systems reviewed and are negative.      Allergies  Review of patient's allergies indicates no known allergies.  Home Medications   Current Outpatient Rx  Name  Route  Sig  Dispense  Refill  . Flaxseed, Linseed, (FLAX SEED OIL) 1000 MG CAPS   Oral   Take 1,000 mg by mouth daily.         . naproxen sodium (ANAPROX) 220 MG tablet   Oral   Take 220 mg by mouth 2 (two) times daily as needed.         . Sodium Chloride-Sodium Bicarb 1.57 G PACK   Nasal   Place 1 each into the nose daily as needed (allergies).          BP 125/76  Pulse 78  Temp(Src) 98 F (36.7 C) (Oral)  Resp 18  SpO2 100%  LMP 11/28/2013 Physical Exam  Constitutional: She is oriented to person, place, and time. She appears well-developed and well-nourished.  HENT:  Head: Normocephalic and atraumatic.  Eyes: EOM are normal. Pupils are equal, round, and reactive to light. No scleral icterus.  Neck: Neck supple.  Cardiovascular: Normal rate, regular rhythm and intact distal pulses.   Pulmonary/Chest: Effort normal and breath sounds normal. No respiratory distress.  Abdominal: Soft. Bowel sounds are normal. She  exhibits no distension and no mass. There is no rebound and no guarding.  Localizes discomfort to left flank without reproducible tenderness. No CVA tenderness. No acute abdomen.  Musculoskeletal: Normal range of motion. She exhibits no edema.  Neurological: She is alert and oriented to person, place, and time.  Skin: Skin is warm and dry.    ED Course  Procedures (including critical care time) Labs Review Labs Reviewed  CBC WITH DIFFERENTIAL - Abnormal; Notable for the following:    Hemoglobin 11.7 (*)    MCV 74.5 (*)    MCH 24.2 (*)    All other components within normal limits  COMPREHENSIVE METABOLIC PANEL - Abnormal; Notable for the following:    Glucose, Bld 108 (*)    Total Bilirubin 0.2 (*)    All other components within normal limits  URINALYSIS, ROUTINE W REFLEX  MICROSCOPIC - Abnormal; Notable for the following:    Leukocytes, UA TRACE (*)    All other components within normal limits  LIPASE, BLOOD  PREGNANCY, URINE  URINE MICROSCOPIC-ADD ON   Imaging Review Ct Abdomen Pelvis Wo Contrast  12/05/2013   CLINICAL DATA:  Left flank pain  EXAM: CT ABDOMEN AND PELVIS WITHOUT CONTRAST  TECHNIQUE: Multidetector CT imaging of the abdomen and pelvis was performed following the standard protocol without intravenous contrast.  COMPARISON:  None.  FINDINGS: BODY WALL: Unremarkable.  LOWER CHEST: Unremarkable.  ABDOMEN/PELVIS:  Liver: No focal abnormality.  Biliary: Mainly collapsed gallbladder. No biliary ductal dilatation.  Pancreas: Unremarkable.  Spleen: Unremarkable.  Adrenals: Unremarkable.  Kidneys and ureters: No hydronephrosis or stone.  Bladder: Unremarkable.  Reproductive: There are 2 masses in the left pelvis which appear discrete. These contact the left uterus. A normal ovary is not seen on the left, such that 1 of these masses is likely an enlarged ovary. The larger measures 6 cm in maximal dimension, the smaller 5 cm. The right ovary is seen with moderate certainty in the right hemipelvis, normal in size.  Bowel: No obstruction. Normal appendix.  Retroperitoneum: No mass or adenopathy.  Peritoneum: No free fluid or gas.  Vascular: No acute abnormality.  OSSEOUS: No acute abnormalities.  These results were called by telephone at the time of interpretation on 12/05/2013 at 4:24 AM to Dr. Teressa Lower , who verbally acknowledged these results.  IMPRESSION: There are 2 masses in the left pelvis, 1 of which is presumably an enlarged ovary (the other likely a fibroid). Ovarian torsion is the diagnosis of exclusion in the setting of left flank pain. Pelvic ultrasound could confirm these findings.   Electronically Signed   By: Jorje Guild M.D.   On: 12/05/2013 04:29   US Transvaginal Non-ob  12/05/2013   CLINICAL DATA:  Left lower quadrant pain. Mass seen on CT. Rule  out torsion.  EXAM: TRANSABDOMINAL AND TRANSVAGINAL ULTRASOUND OF PELVIS  DOPPLER ULTRASOUND OF OVARIES  TECHNIQUE: Both transabdominal and transvaginal ultrasound examinations of the pelvis were performed. Transabdominal technique was performed for global imaging of the pelvis including uterus, ovaries, adnexal regions, and pelvic cul-de-sac.  It was necessary to proceed with endovaginal exam following the transabdominal exam to visualize the ovaries. Color and duplex Doppler ultrasound was utilized to evaluate blood flow to the ovaries.  COMPARISON:  Abdominal CT from the same day  FINDINGS: Measurements: 8 x 4 x 5 cm. There are multiple (at least 3) heterogeneous, partly shadowing masses within the myometrium, measuring 1.4 cm in the sub serosal fundus and 2 to 2.4 cm in  the posterior uterine body and lower uterine segment, respectively. There may be a larger, sub serosal fibroid not measured based on previous CT.  Endometrium  Thickness: 6 mm.  No focal abnormality visualized.  Right ovary  Measurements: 4.2 x 3.3 x 3.3 cm. Follicles noted within the ovary. There are also multiple (at least 3) nonvascular areas with homogeneous diffuse mid level echoes. These do not have echogenic foci within the walls.  Left ovary  Measurements: 5.5 x 6.3 x 7.2 cm. The ovary is enlarged by a 4.6 x 3 x 4.5 cm nonvascular mass with homogeneous diffuse mid level echoes, similar to the contralateral ovary. There is an echogenic shadowing focus along the superficial surface. No evidence of hair or fat.  Pulsed Doppler evaluation of both ovaries demonstrates normal low-resistance arterial and venous waveforms.  Other findings  No free fluid.  IMPRESSION: 1. Enlarged (7cm) left ovary containing a nearly 5 cm cystic mass. Although there is currently blood flow to the left ovary, there may be intermittent/recent torsion as an explanation of the patient's left lower quadrant pain. 2. The left ovarian mass is likely either a dermoid or  endometrioma (endometrioma favored given similar appearing, smaller masses present in the right adnexa). 3. At least 3 uterine fibroids, up to 2 cm diameter.   Electronically Signed   By: Jorje Guild M.D.   On: 12/05/2013 06:08   US Pelvis Complete  12/05/2013   CLINICAL DATA:  Left lower quadrant pain. Mass seen on CT. Rule out torsion.  EXAM: TRANSABDOMINAL AND TRANSVAGINAL ULTRASOUND OF PELVIS  DOPPLER ULTRASOUND OF OVARIES  TECHNIQUE: Both transabdominal and transvaginal ultrasound examinations of the pelvis were performed. Transabdominal technique was performed for global imaging of the pelvis including uterus, ovaries, adnexal regions, and pelvic cul-de-sac.  It was necessary to proceed with endovaginal exam following the transabdominal exam to visualize the ovaries. Color and duplex Doppler ultrasound was utilized to evaluate blood flow to the ovaries.  COMPARISON:  Abdominal CT from the same day  FINDINGS: Measurements: 8 x 4 x 5 cm. There are multiple (at least 3) heterogeneous, partly shadowing masses within the myometrium, measuring 1.4 cm in the sub serosal fundus and 2 to 2.4 cm in the posterior uterine body and lower uterine segment, respectively. There may be a larger, sub serosal fibroid not measured based on previous CT.  Endometrium  Thickness: 6 mm.  No focal abnormality visualized.  Right ovary  Measurements: 4.2 x 3.3 x 3.3 cm. Follicles noted within the ovary. There are also multiple (at least 3) nonvascular areas with homogeneous diffuse mid level echoes. These do not have echogenic foci within the walls.  Left ovary  Measurements: 5.5 x 6.3 x 7.2 cm. The ovary is enlarged by a 4.6 x 3 x 4.5 cm nonvascular mass with homogeneous diffuse mid level echoes, similar to the contralateral ovary. There is an echogenic shadowing focus along the superficial surface. No evidence of hair or fat.  Pulsed Doppler evaluation of both ovaries demonstrates normal low-resistance arterial and venous  waveforms.  Other findings  No free fluid.  IMPRESSION: 1. Enlarged (7cm) left ovary containing a nearly 5 cm cystic mass. Although there is currently blood flow to the left ovary, there may be intermittent/recent torsion as an explanation of the patient's left lower quadrant pain. 2. The left ovarian mass is likely either a dermoid or endometrioma (endometrioma favored given similar appearing, smaller masses present in the right adnexa). 3. At least 3 uterine fibroids, up to  2 cm diameter.   Electronically Signed   By: Jorje Guild M.D.   On: 12/05/2013 06:08   [place] Art/ven Flow Abd Pelv Doppler  12/05/2013   CLINICAL DATA:  Left lower quadrant pain. Mass seen on CT. Rule out torsion.  EXAM: TRANSABDOMINAL AND TRANSVAGINAL ULTRASOUND OF PELVIS  DOPPLER ULTRASOUND OF OVARIES  TECHNIQUE: Both transabdominal and transvaginal ultrasound examinations of the pelvis were performed. Transabdominal technique was performed for global imaging of the pelvis including uterus, ovaries, adnexal regions, and pelvic cul-de-sac.  It was necessary to proceed with endovaginal exam following the transabdominal exam to visualize the ovaries. Color and duplex Doppler ultrasound was utilized to evaluate blood flow to the ovaries.  COMPARISON:  Abdominal CT from the same day  FINDINGS: Measurements: 8 x 4 x 5 cm. There are multiple (at least 3) heterogeneous, partly shadowing masses within the myometrium, measuring 1.4 cm in the sub serosal fundus and 2 to 2.4 cm in the posterior uterine body and lower uterine segment, respectively. There may be a larger, sub serosal fibroid not measured based on previous CT.  Endometrium  Thickness: 6 mm.  No focal abnormality visualized.  Right ovary  Measurements: 4.2 x 3.3 x 3.3 cm. Follicles noted within the ovary. There are also multiple (at least 3) nonvascular areas with homogeneous diffuse mid level echoes. These do not have echogenic foci within the walls.  Left ovary  Measurements: 5.5 x 6.3  x 7.2 cm. The ovary is enlarged by a 4.6 x 3 x 4.5 cm nonvascular mass with homogeneous diffuse mid level echoes, similar to the contralateral ovary. There is an echogenic shadowing focus along the superficial surface. No evidence of hair or fat.  Pulsed Doppler evaluation of both ovaries demonstrates normal low-resistance arterial and venous waveforms.  Other findings  No free fluid.  IMPRESSION: 1. Enlarged (7cm) left ovary containing a nearly 5 cm cystic mass. Although there is currently blood flow to the left ovary, there may be intermittent/recent torsion as an explanation of the patient's left lower quadrant pain. 2. The left ovarian mass is likely either a dermoid or endometrioma (endometrioma favored given similar appearing, smaller masses present in the right adnexa). 3. At least 3 uterine fibroids, up to 2 cm diameter.   Electronically Signed   By: Jorje Guild M.D.   On: 12/05/2013 06:08    IV fluids. IV Dilaudid. IV Zofran.  6:45 AM CT and ultrasound results shared with patient. She was pain-free with IV Dilaudid the pain is now returning 3/10. IV fentanyl provided. Discussed with OB/GYN on call as above and Dr. Glo Herring recommends close GYN followup. Prescription for Norco and referral provided. Patient states understanding all discharge and followup instructions in addition to strict return precautions.   MDM   Diagnosis: Left ovarian mass, uterine fibroids, abdominal pain  Evaluated with labs and imaging reviewed as above. Treated with IV narcotics, IV fluids and IV Zofran - intermittent pain control. Stable for discharge and outpatient followup. OB/GYN consult. Vital signs and nursing notes reviewed and considered.    Teressa Lower, MD 12/05/13 [phone]

## 2013-12-05 NOTE — ED Notes (Signed)
Pt states that she has had left sided abd pain x 1 week; pt states that the pain has been intermittent but has progresses to constant; pt c/o nausea without vomiting; pt states that having a BM makes the pain worse

## 2013-12-05 NOTE — Discharge Instructions (Signed)

## 2013-12-05 NOTE — ED Notes (Signed)
MD at bedside at this time.

## 2013-12-05 NOTE — ED Notes (Signed)
Pt ambulated to BR at this time.

## 2013-12-05 NOTE — ED Notes (Signed)
Pt to CT at this time.

## 2013-12-06 ENCOUNTER — Encounter: Payer: Self-pay | Admitting: Medical

## 2013-12-06 ENCOUNTER — Ambulatory Visit (INDEPENDENT_AMBULATORY_CARE_PROVIDER_SITE_OTHER): Payer: 59 | Admitting: Medical

## 2013-12-06 VITALS — BP 118/80 | HR 68 | Temp 98.1°F | Resp 16 | Wt 250.0 lb

## 2013-12-06 DIAGNOSIS — N838 Other noninflammatory disorders of ovary, fallopian tube and broad ligament: Secondary | ICD-10-CM

## 2013-12-06 DIAGNOSIS — R5381 Other malaise: Secondary | ICD-10-CM

## 2013-12-06 DIAGNOSIS — G478 Other sleep disorders: Secondary | ICD-10-CM

## 2013-12-06 DIAGNOSIS — R5383 Other fatigue: Secondary | ICD-10-CM

## 2013-12-06 DIAGNOSIS — D259 Leiomyoma of uterus, unspecified: Secondary | ICD-10-CM

## 2013-12-06 DIAGNOSIS — N839 Noninflammatory disorder of ovary, fallopian tube and broad ligament, unspecified: Secondary | ICD-10-CM

## 2013-12-06 DIAGNOSIS — E669 Obesity, unspecified: Secondary | ICD-10-CM

## 2013-12-06 MED ORDER — HYDROCODONE-ACETAMINOPHEN 5-325 MG PO TABS: 1.0000 | ORAL_TABLET | Freq: Four times a day (QID) | ORAL | Status: DC | PRN

## 2013-12-06 NOTE — Telephone Encounter (Signed)
Waiting for the patient to call back. CLS

## 2013-12-06 NOTE — Telephone Encounter (Signed)
Refer to gynecology.  She is suppose to call back with gynecologist's info

## 2013-12-06 NOTE — Progress Notes (Signed)
   Subjective:   Frances Hill is a 28 y.o. female presenting on 12/06/2013 with Follow-up and everything makes her sick to her stomach  Here for hospital f/u.   Went to ED yesterday/Wednesday night for severe abdominal pain of left lower region.  Had labs, [place], CT.  Was advised she had a mass and fibroids, advised gyn f/u.  She was prescribed pain meidcaiton, nausea medication, needs referral to gynecology.  Today she currently still having pain left side, feels a knot, some nausea.   No bleeding.  Last BM night before last.  Having some pain with BM but no constipation, no blood in stool either, no urinary complaints.  She notes last few weeks pain sometimes with intercourse.  No other aggravating or relieving factors.   Thinks she has sleep apnea.  Snores, fatigued sometimes, not always awakening feeling rested, has some day time somnolence on occasion.  No other complaint.  Review of Systems ROS as in subjective      Objective:     Filed Vitals:   12/06/13 1144  BP: 118/80  Pulse: 68  Temp: 98.1 F (36.7 C)  Resp: 16    General appearance: alert, no distress, WD/WN Oral cavity: MMM, no lesions Neck: supple, no lymphadenopathy, no thyromegaly, no masses Heart: RRR, normal S1, S2, no murmurs Lungs: CTA bilaterally, no wheezes, rhonchi, or rales Abdomen: +bs, soft, mild right and left lower abdominal tendnerss, otherwise non distended, no masses, no hepatomegaly, no splenomegaly Back : nontender Pulses: 2+ symmetric, upper and lower extremities, normal cap refill      Assessment: Encounter Diagnoses  Name Primary?  . Ovarian mass Yes  . Uterine fibroid   . Other malaise and fatigue   . Obesity, unspecified   . Non-restorative sleep      Plan: Ovarian mass, fibroid - referral to gynecology, refilled pain medication for prn use, discussed the findings on CT, ultrasound.  Seems to be in less pain today.  Fatigue, non restorative sleep - we discussed her concerns.   Advised head of bed elevation, weight loss through lifestyle changes.  After she sees gynecology, can pursue sleep study if needed.  Obesity - advised weight loss through lifestyle changes   Frances Hill was seen today for follow-up and everything makes her sick to her stomach.  Diagnoses and associated orders for this visit:  Ovarian mass  Uterine fibroid  Other malaise and fatigue  Obesity, unspecified  Non-restorative sleep  Other Orders - HYDROcodone-acetaminophen (NORCO/VICODIN) 5-325 MG per tablet; Take 1 tablet by mouth every 6 (six) hours as needed.   Return if symptoms worsen or fail to improve.

## 2013-12-11 ENCOUNTER — Ambulatory Visit (INDEPENDENT_AMBULATORY_CARE_PROVIDER_SITE_OTHER): Payer: 59 | Admitting: Obstetrics & Gynecology

## 2013-12-11 ENCOUNTER — Encounter: Payer: 59 | Admitting: Obstetrics and Gynecology

## 2013-12-11 ENCOUNTER — Encounter: Payer: Self-pay | Admitting: Obstetrics & Gynecology

## 2013-12-11 VITALS — BP 128/88 | HR 72 | Temp 98.4°F | Ht 66.0 in | Wt 248.6 lb

## 2013-12-11 DIAGNOSIS — N83209 Unspecified ovarian cyst, unspecified side: Secondary | ICD-10-CM | POA: Insufficient documentation

## 2013-12-11 MED ORDER — NAPROXEN SODIUM 550 MG PO TABS: 550.0000 mg | ORAL_TABLET | Freq: Two times a day (BID) | ORAL | Status: DC

## 2013-12-11 NOTE — Patient Instructions (Addendum)
Return to clinic for any obstetric concerns or go to [facility] for evaluation Diagnostic Laparoscopy Laparoscopy is a surgical procedure. It is used to diagnose and treat diseases inside the belly (abdomen). It is usually a brief, common, and relatively simple procedure. The laparoscopeis a thin, lighted, pencil-sized instrument. It is like a telescope. It is inserted into your abdomen through a small cut (incision). Your caregiver can look at the organs inside your body through this instrument. He or she can see if there is anything abnormal. Laparoscopy can be done either in a hospital or outpatient clinic. You may be given a mild sedative to help you relax before the procedure. Once in the operating room, you will be given a drug to make you sleep (general anesthesia). Laparoscopy usually lasts less than 1 hour. After the procedure, you will be monitored in a recovery area until you are stable and doing well. Once you are home, it will take 2 to 3 days to fully recover. RISKS AND COMPLICATIONS  Laparoscopy has relatively few risks. Your caregiver will discuss the risks with you before the procedure. Some problems that can occur include:  Infection.  Bleeding.  Damage to other organs.  Anesthetic side effects. PROCEDURE Once you receive anesthesia, your surgeon inflates the abdomen with a harmless gas (carbon dioxide). This makes the organs easier to see. The laparoscope is inserted into the abdomen through a small incision. This allows your surgeon to see into the abdomen. Other small instruments are also inserted into the abdomen through other small openings. Many surgeons attach a video camera to the laparoscope to enlarge the view. During a diagnostic laparoscopy, the surgeon may be looking for inflammation, infection, or cancer. Your surgeon may take tissue samples(biopsies). The samples are sent to a specialist in looking at cells and tissue samples (pathologist). The pathologist examines them  under a microscope. Biopsies can help to diagnose or confirm a disease. AFTER THE PROCEDURE   The gas is released from inside the abdomen.  The incisions are closed with stitches (sutures). Because these incisions are small (usually less than 1/2 inch), there is usually minimal discomfort after the procedure. There may be some mild discomfort in the throat. This is from the tube placed in the throat while you were sleeping. You may have some mild abdominal discomfort. There may also be discomfort from the instrument placement incisions in the abdomen.  The recovery time is shortened as long as there are no complications.  You will rest in a recovery room until stable and doing well. As long as there are no complications, you may be allowed to go home. FINDING OUT THE RESULTS OF YOUR TEST Not all test results are available during your visit. If your test results are not back during the visit, make an appointment with your caregiver to find out the results. Do not assume everything is normal if you have not heard from your caregiver or the medical facility. It is important for you to follow up on all of your test results. HOME CARE INSTRUCTIONS   Take all medicines as directed.  Only take over-the-counter or prescription medicines for pain, discomfort, or fever as directed by your caregiver.  Resume daily activities as directed.  Showers are preferred over baths.  You may resume sexual activities in 1 week or as directed.  Do not drive while taking narcotics. SEEK MEDICAL CARE IF:   There is increasing abdominal pain.  There is new pain in the shoulders (shoulder strap areas).  You feel lightheaded or faint.  You have the chills.  You or your child has an oral temperature above 102 F (38.9 C).  There is pus-like (purulent) drainage from any of the wounds.  You are unable to pass gas or have a bowel movement.  You feel sick to your stomach (nauseous) or throw up (vomit). MAKE  SURE YOU:   Understand these instructions.  Will watch your condition.  Will get help right away if you are not doing well or get worse. Document Released: 12/26/2000 Document Revised: 01/14/2013 Document Reviewed: 09/19/2007 [facility] Patient Information 2014 [place], [place].

## 2013-12-12 ENCOUNTER — Telehealth: Payer: Self-pay | Admitting: Medical

## 2013-12-12 NOTE — Telephone Encounter (Signed)
I read over the gynecology note where she saw them yesterday.  Please call and find out what her concerns are?  Why is she desiring a second opinion?   The gyn notes seem straightforward, but find out.

## 2013-12-12 NOTE — Progress Notes (Signed)
CLINIC ENCOUNTER NOTE  History:  28 y.o. G0 here today for discussion of management of complex left ovarian cyst.  She had an ER visit for this complaint; reports that pain is improved overall, but still occurs at night.  No N/V with the pain, no on-and-off pattern of the pain.  Pain is mostly constant when it occurs.  She is accompanied by her boyfriend.  The following portions of the patient's history were reviewed and updated as appropriate: allergies, current medications, past family history, past medical history, past social history, past surgical history and problem list.  Review of Systems:  Pertinent items are noted in HPI.  Objective:  Physical Exam BP 128/88  Pulse 72  Temp(Src) 98.4 F (36.9 C)  Ht 5\' 6"  (1.676 m)  Wt 248 lb 9.6 oz (112.764 kg)  BMI 40.14 kg/m2  LMP 11/28/2013 Gen: NAD Abd: Soft, obese, mild LLQ tenderness, no rebound, no guarding, nondistended Pelvic: Deferred  Labs and Imaging  12/05/2013  CT ABDOMEN AND PELVIS WITHOUT CONTRAST  CLINICAL DATA:  Left flank pain   COMPARISON:  None.  FINDINGS: BODY WALL: Unremarkable.  LOWER CHEST: Unremarkable.  ABDOMEN/PELVIS:  Liver: No focal abnormality.  Biliary: Mainly collapsed gallbladder. No biliary ductal dilatation.  Pancreas: Unremarkable.  Spleen: Unremarkable.  Adrenals: Unremarkable.  Kidneys and ureters: No hydronephrosis or stone.  Bladder: Unremarkable.  Reproductive: There are 2 masses in the left pelvis which appear discrete. These contact the left uterus. A normal ovary is not seen on the left, such that 1 of these masses is likely an enlarged ovary. The larger measures 6 cm in maximal dimension, the smaller 5 cm. The right ovary is seen with moderate certainty in the right hemipelvis, normal in size.  Bowel: No obstruction. Normal appendix.  Retroperitoneum: No mass or adenopathy.  Peritoneum: No free fluid or gas.  Vascular: No acute abnormality.  OSSEOUS: No acute abnormalities.  These results were  called by telephone at the time of interpretation on 12/05/2013 at 4:24 AM to Dr. Teressa Lower , who verbally acknowledged these results.  IMPRESSION: There are 2 masses in the left pelvis, 1 of which is presumably an enlarged ovary (the other likely a fibroid). Ovarian torsion is the diagnosis of exclusion in the setting of left flank pain. Pelvic ultrasound could confirm these findings.   Electronically Signed   By: Jorje Guild M.D.   On: 12/05/2013 04:29   12/05/2013     TRANSABDOMINAL AND TRANSVAGINAL ULTRASOUND OF PELVIS  DOPPLER  ULTRASOUND OF OVARIES  CLINICAL DATA:  Left lower quadrant pain. Mass seen on CT. Rule out torsion.  COMPARISON:  Abdominal CT from the same day  FINDINGS: Measurements: 8 x 4 x 5 cm. There are multiple (at least 3) heterogeneous, partly shadowing masses within the myometrium, measuring 1.4 cm in the sub serosal fundus and 2 to 2.4 cm in the posterior uterine body and lower uterine segment, respectively. There may be a larger, sub serosal fibroid not measured based on previous CT.  Endometrium  Thickness: 6 mm.  No focal abnormality visualized.  Right ovary  Measurements: 4.2 x 3.3 x 3.3 cm. Follicles noted within the ovary. There are also multiple (at least 3) nonvascular areas with homogeneous diffuse mid level echoes. These do not have echogenic foci within the walls.  Left ovary  Measurements: 5.5 x 6.3 x 7.2 cm. The ovary is enlarged by a 4.6 x 3 x 4.5 cm nonvascular mass with homogeneous diffuse mid level echoes, similar to  the contralateral ovary. There is an echogenic shadowing focus along the superficial surface. No evidence of hair or fat.  Pulsed Doppler evaluation of both ovaries demonstrates normal low-resistance arterial and venous waveforms.  Other findings  No free fluid.  IMPRESSION: 1. Enlarged (7cm) left ovary containing a nearly 5 cm cystic mass. Although there is currently blood flow to the left ovary, there may be intermittent/recent torsion as an explanation of  the patient's left lower quadrant pain. 2. The left ovarian mass is likely either a dermoid or endometrioma (endometrioma favored given similar appearing, smaller masses present in the right adnexa). 3. At least 3 uterine fibroids, up to 2 cm diameter.   Electronically Signed   By: Jorje Guild M.D.   On: 12/05/2013 06:08    Assessment & Plan:  5 cm left ovarian complex cystic mass; imaging concerned about dermoid vs endometrioma (favored diagnosis). No signs of torsion at this time.  Patient also has multiple fibroids; but she is asymptomatic from this standpoint, just wants "something done about the cyst".  Discussed management options: expectant management with follow up scan in about 6 weeks vs laparoscopic cystectomy.  Patient desires surgical management with laparoscopic left ovarian cystectomy.  The risks of surgery were discussed in detail with the patient including but not limited to: bleeding which may require transfusion or reoperation; infection which may require prolonged hospitalization or re-hospitalization and antibiotic therapy; injury to bowel, bladder, ureters and major vessels or other surrounding organs; need for additional procedures including laparotomy; thromboembolic phenomenon, incisional problems and other postoperative or anesthesia complications.  Patient was told that the likelihood that her condition and symptoms will be treated effectively with this surgical management was very high; the postoperative expectations were also discussed in detail. She was told that her fibroids will not be operated on unless they were noted to be involved in the left adnexal mass; this will be known at the time of surgery.  The patient also understands the alternative treatment options which were discussed in full. All questions were answered.  She was told that she will be contacted by our surgical scheduler regarding the time and date of her surgery; routine preoperative instructions of having  nothing to eat or drink after midnight on the day prior to surgery and also coming to the hospital 1.5 hours prior to her time of surgery were also emphasized.  She was told she may be called for a preoperative appointment about a week prior to surgery and will be given further preoperative instructions at that visit. Printed patient education handouts about the procedure were given to the patient to review at home.  Torsion precautions reviewed in detail with patient and her boyfriend.  Naproxen prescribed for pain.    Verita Schneiders, MD, [place] Attending [place], [place]

## 2013-12-13 NOTE — Telephone Encounter (Signed)
Pt states when she seen the gynceologist the other day that the Dr. Rockey Situ her that she was booked out until June for surgery and she would just have to keep the cyst in and take pain meds for it. 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

## 2013-12-13 NOTE — Telephone Encounter (Signed)
pls call the gynecology office and give them this info.  Can one of her partners potentially do the surgery sooner?  She is concerned about having to take narcotic pain medication for 2.5 months and risk safety behind the wheel vs getting surgery sooner.  If not, we may need to get her in with different gynecologist sooner.

## 2013-12-17 NOTE — Telephone Encounter (Signed)
i left a message for the patient to callback. CLS

## 2013-12-18 ENCOUNTER — Encounter (HOSPITAL_COMMUNITY): Payer: Self-pay | Admitting: *Deleted

## 2013-12-23 ENCOUNTER — Encounter (HOSPITAL_COMMUNITY): Payer: Self-pay | Admitting: Pharmacist

## 2013-12-23 ENCOUNTER — Encounter: Payer: Self-pay | Admitting: *Deleted

## 2013-12-23 ENCOUNTER — Telehealth: Payer: Self-pay | Admitting: General Practice

## 2013-12-23 NOTE — Telephone Encounter (Signed)
Patient called in to front office asking if her fmla paperwork had been sent in to her job. Told patient it appears it was faxed, but if they say they haven't received it- call us back and we can refax it. Patient verbalized understanding and had no further questions

## 2013-12-25 ENCOUNTER — Encounter (HOSPITAL_COMMUNITY): Payer: 59 | Admitting: Anesthesiology

## 2013-12-25 ENCOUNTER — Encounter (HOSPITAL_COMMUNITY)
Admission: RE | Disposition: A | Discharge status: Home / Self Care | Payer: Self-pay | Source: Ambulatory Visit | Attending: Obstetrics & Gynecology

## 2013-12-25 ENCOUNTER — Encounter (HOSPITAL_COMMUNITY): Payer: Self-pay | Admitting: *Deleted

## 2013-12-25 ENCOUNTER — Ambulatory Visit (HOSPITAL_COMMUNITY): Payer: 59 | Admitting: Anesthesiology

## 2013-12-25 ENCOUNTER — Ambulatory Visit (HOSPITAL_COMMUNITY)
Admission: RE | Admit: 2013-12-25 | Discharge: 2013-12-25 | Disposition: A | Discharge status: Home / Self Care | Payer: 59 | Source: Ambulatory Visit | Attending: Obstetrics & Gynecology | Admitting: Obstetrics & Gynecology

## 2013-12-25 DIAGNOSIS — D259 Leiomyoma of uterus, unspecified: Secondary | ICD-10-CM | POA: Insufficient documentation

## 2013-12-25 DIAGNOSIS — N83209 Unspecified ovarian cyst, unspecified side: Secondary | ICD-10-CM | POA: Insufficient documentation

## 2013-12-25 DIAGNOSIS — N803 Endometriosis of pelvic peritoneum, unspecified: Secondary | ICD-10-CM | POA: Insufficient documentation

## 2013-12-25 DIAGNOSIS — Z6841 Body Mass Index (BMI) 40.0 and over, adult: Secondary | ICD-10-CM | POA: Insufficient documentation

## 2013-12-25 DIAGNOSIS — F411 Generalized anxiety disorder: Secondary | ICD-10-CM | POA: Insufficient documentation

## 2013-12-25 HISTORY — PX: SALPINGOOPHORECTOMY: SHX82

## 2013-12-25 HISTORY — PX: LAPAROSCOPY: SHX197

## 2013-12-25 LAB — CBC
HCT: 38.4 % (ref 36.0–46.0)
Hemoglobin: 13 g/dL (ref 12.0–15.0)
MCH: 25.3 pg — ABNORMAL LOW (ref 26.0–34.0)
MCHC: 33.9 g/dL (ref 30.0–36.0)
MCV: 74.7 fL — ABNORMAL LOW (ref 78.0–100.0)
Platelets: 313 10*3/uL (ref 150–400)
RBC: 5.14 MIL/uL — ABNORMAL HIGH (ref 3.87–5.11)
RDW: 13.9 % (ref 11.5–15.5)
WBC: 7.5 10*3/uL (ref 4.0–10.5)

## 2013-12-25 LAB — PREGNANCY, URINE: Preg Test, Ur: NEGATIVE

## 2013-12-25 SURGERY — LAPAROSCOPY OPERATIVE
Anesthesia: General | Site: Abdomen

## 2013-12-25 MED ORDER — MIDAZOLAM HCL 5 MG/5ML IJ SOLN
INTRAMUSCULAR | Status: DC | PRN
  Administered 2013-12-25: 2 mg via INTRAVENOUS

## 2013-12-25 MED ORDER — ROCURONIUM BROMIDE 100 MG/10ML IV SOLN
INTRAVENOUS | Status: AC
Start: 2013-12-25 — End: 2013-12-25
  Filled 2013-12-25: qty 1

## 2013-12-25 MED ORDER — BUPIVACAINE HCL (PF) 0.5 % IJ SOLN
INTRAMUSCULAR | Status: AC
  Filled 2013-12-25: qty 30

## 2013-12-25 MED ORDER — FENTANYL CITRATE 0.05 MG/ML IJ SOLN: 50.0000 ug | INTRAMUSCULAR | Status: DC | PRN

## 2013-12-25 MED ORDER — PROPOFOL 10 MG/ML IV EMUL
INTRAVENOUS | Status: AC
  Filled 2013-12-25: qty 20

## 2013-12-25 MED ORDER — NEOSTIGMINE METHYLSULFATE 1 MG/ML IJ SOLN
INTRAMUSCULAR | Status: DC | PRN
  Administered 2013-12-25: 3 mg via INTRAVENOUS

## 2013-12-25 MED ORDER — FENTANYL CITRATE 0.05 MG/ML IJ SOLN
INTRAMUSCULAR | Status: DC | PRN
  Administered 2013-12-25: 100 ug via INTRAVENOUS
  Administered 2013-12-25: 150 ug via INTRAVENOUS
  Administered 2013-12-25: 100 ug via INTRAVENOUS
  Administered 2013-12-25: 50 ug via INTRAVENOUS
  Administered 2013-12-25: 100 ug via INTRAVENOUS

## 2013-12-25 MED ORDER — HYDROMORPHONE HCL PF 1 MG/ML IJ SOLN
INTRAMUSCULAR | Status: AC
  Filled 2013-12-25: qty 1

## 2013-12-25 MED ORDER — ONDANSETRON HCL 4 MG/2ML IJ SOLN
INTRAMUSCULAR | Status: AC
  Filled 2013-12-25: qty 2

## 2013-12-25 MED ORDER — FENTANYL CITRATE 0.05 MG/ML IJ SOLN
INTRAMUSCULAR | Status: AC
  Filled 2013-12-25: qty 5

## 2013-12-25 MED ORDER — PROPOFOL 10 MG/ML IV BOLUS
INTRAVENOUS | Status: DC | PRN
  Administered 2013-12-25: 200 mg via INTRAVENOUS

## 2013-12-25 MED ORDER — LACTATED RINGERS IR SOLN
Status: DC | PRN
  Administered 2013-12-25: 3000 mL

## 2013-12-25 MED ORDER — MEPERIDINE HCL 25 MG/ML IJ SOLN: 6.2500 mg | INTRAMUSCULAR | Status: DC | PRN

## 2013-12-25 MED ORDER — FENTANYL CITRATE 0.05 MG/ML IJ SOLN
INTRAMUSCULAR | Status: AC
  Administered 2013-12-25: 50 ug via INTRAVENOUS
  Filled 2013-12-25: qty 2

## 2013-12-25 MED ORDER — IBUPROFEN 600 MG PO TABS: 600.0000 mg | ORAL_TABLET | Freq: Four times a day (QID) | ORAL | Status: DC | PRN

## 2013-12-25 MED ORDER — MIDAZOLAM HCL 2 MG/2ML IJ SOLN
INTRAMUSCULAR | Status: AC
  Filled 2013-12-25: qty 2

## 2013-12-25 MED ORDER — DEXAMETHASONE SODIUM PHOSPHATE 10 MG/ML IJ SOLN
INTRAMUSCULAR | Status: DC | PRN
  Administered 2013-12-25: 10 mg via INTRAVENOUS

## 2013-12-25 MED ORDER — LIDOCAINE HCL (CARDIAC) 20 MG/ML IV SOLN
INTRAVENOUS | Status: AC
  Filled 2013-12-25: qty 5

## 2013-12-25 MED ORDER — GLYCOPYRROLATE 0.2 MG/ML IJ SOLN
INTRAMUSCULAR | Status: AC
  Filled 2013-12-25: qty 1

## 2013-12-25 MED ORDER — OXYCODONE-ACETAMINOPHEN 5-325 MG PO TABS: 1.0000 | ORAL_TABLET | Freq: Four times a day (QID) | ORAL | Status: DC | PRN

## 2013-12-25 MED ORDER — NEOSTIGMINE METHYLSULFATE 1 MG/ML IJ SOLN
INTRAMUSCULAR | Status: AC
  Filled 2013-12-25: qty 1

## 2013-12-25 MED ORDER — ONDANSETRON HCL 4 MG/2ML IJ SOLN
INTRAMUSCULAR | Status: DC | PRN
  Administered 2013-12-25: 4 mg via INTRAVENOUS

## 2013-12-25 MED ORDER — ROCURONIUM BROMIDE 100 MG/10ML IV SOLN
INTRAVENOUS | Status: DC | PRN
  Administered 2013-12-25: 50 mg via INTRAVENOUS
  Administered 2013-12-25: 10 mg via INTRAVENOUS
  Administered 2013-12-25: 20 mg via INTRAVENOUS

## 2013-12-25 MED ORDER — OXYCODONE-ACETAMINOPHEN 5-325 MG PO TABS
1.0000 | ORAL_TABLET | ORAL | Status: DC | PRN
  Administered 2013-12-25: 1 via ORAL

## 2013-12-25 MED ORDER — KETOROLAC TROMETHAMINE 30 MG/ML IJ SOLN
INTRAMUSCULAR | Status: DC | PRN
  Administered 2013-12-25: 30 mg via INTRAVENOUS

## 2013-12-25 MED ORDER — LACTATED RINGERS IV SOLN
INTRAVENOUS | Status: DC
  Administered 2013-12-25 (×2): via INTRAVENOUS

## 2013-12-25 MED ORDER — OXYCODONE-ACETAMINOPHEN 5-325 MG PO TABS
ORAL_TABLET | ORAL | Status: AC
  Filled 2013-12-25: qty 1

## 2013-12-25 MED ORDER — DOCUSATE SODIUM 100 MG PO CAPS: 100.0000 mg | ORAL_CAPSULE | Freq: Two times a day (BID) | ORAL | Status: DC | PRN

## 2013-12-25 MED ORDER — HYDROMORPHONE HCL PF 1 MG/ML IJ SOLN
INTRAMUSCULAR | Status: DC | PRN
  Administered 2013-12-25: 1 mg via INTRAVENOUS

## 2013-12-25 MED ORDER — DEXAMETHASONE SODIUM PHOSPHATE 10 MG/ML IJ SOLN
INTRAMUSCULAR | Status: AC
  Filled 2013-12-25: qty 1

## 2013-12-25 MED ORDER — CEFAZOLIN SODIUM-DEXTROSE 2-3 GM-% IV SOLR
2.0000 g | Freq: Once | INTRAVENOUS | Status: AC
  Administered 2013-12-25: 2 g via INTRAVENOUS
  Filled 2013-12-25: qty 50

## 2013-12-25 MED ORDER — BUPIVACAINE HCL (PF) 0.5 % IJ SOLN
INTRAMUSCULAR | Status: DC | PRN
  Administered 2013-12-25: 2 mL

## 2013-12-25 MED ORDER — LIDOCAINE HCL (CARDIAC) 20 MG/ML IV SOLN
INTRAVENOUS | Status: DC | PRN
  Administered 2013-12-25: 60 mg via INTRAVENOUS

## 2013-12-25 MED ORDER — FENTANYL CITRATE 0.05 MG/ML IJ SOLN
25.0000 ug | INTRAMUSCULAR | Status: DC | PRN
  Administered 2013-12-25 (×2): 50 ug via INTRAVENOUS

## 2013-12-25 MED ORDER — KETOROLAC TROMETHAMINE 30 MG/ML IJ SOLN
INTRAMUSCULAR | Status: AC
  Filled 2013-12-25: qty 1

## 2013-12-25 MED ORDER — GLYCOPYRROLATE 0.2 MG/ML IJ SOLN
INTRAMUSCULAR | Status: DC | PRN
  Administered 2013-12-25: 0.6 mg via INTRAVENOUS

## 2013-12-25 MED ORDER — METOCLOPRAMIDE HCL 5 MG/ML IJ SOLN
INTRAMUSCULAR | Status: AC
Start: 2013-12-25 — End: 2013-12-25
  Administered 2013-12-25: 10 mg via INTRAVENOUS
  Filled 2013-12-25: qty 2

## 2013-12-25 MED ORDER — METOCLOPRAMIDE HCL 5 MG/ML IJ SOLN
10.0000 mg | Freq: Once | INTRAMUSCULAR | Status: AC | PRN
  Administered 2013-12-25: 10 mg via INTRAVENOUS

## 2013-12-25 MED ORDER — CEFAZOLIN SODIUM-DEXTROSE 2-3 GM-% IV SOLR
INTRAVENOUS | Status: AC
  Filled 2013-12-25: qty 50

## 2013-12-25 SURGICAL SUPPLY — 30 items
BARRIER ADHS 3X4 INTERCEED (GAUZE/BANDAGES/DRESSINGS) ×3 IMPLANT
CABLE HIGH FREQUENCY MONO STRZ (ELECTRODE) IMPLANT
CHLORAPREP W/TINT 26ML (MISCELLANEOUS) ×3 IMPLANT
CLOTH BEACON ORANGE TIMEOUT ST (SAFETY) ×3 IMPLANT
DERMABOND ADVANCED (GAUZE/BANDAGES/DRESSINGS) ×1
DERMABOND ADVANCED .7 DNX12 (GAUZE/BANDAGES/DRESSINGS) ×2 IMPLANT
GAUZE SPONGE 4X4 16PLY XRAY LF (GAUZE/BANDAGES/DRESSINGS) ×3 IMPLANT
GLOVE BIO SURGEON STRL SZ7 (GLOVE) ×6 IMPLANT
GLOVE BIOGEL M 6.5 STRL (GLOVE) ×3 IMPLANT
GLOVE BIOGEL PI IND STRL 7.0 (GLOVE) ×6 IMPLANT
GLOVE BIOGEL PI INDICATOR 7.0 (GLOVE) ×3
GOWN STRL REUS W/TWL LRG LVL3 (GOWN DISPOSABLE) ×12 IMPLANT
HEMOSTAT SURGICEL 4X8 (HEMOSTASIS) ×3 IMPLANT
NEEDLE INSUFFLATION 120MM (ENDOMECHANICALS) ×3 IMPLANT
NS IRRIG 1000ML POUR BTL (IV SOLUTION) IMPLANT
PACK LAPAROSCOPY BASIN (CUSTOM PROCEDURE TRAY) ×3 IMPLANT
POUCH SPECIMEN RETRIEVAL 10MM (ENDOMECHANICALS) ×3 IMPLANT
PROTECTOR NERVE ULNAR (MISCELLANEOUS) ×6 IMPLANT
SET IRRIG TUBING LAPAROSCOPIC (IRRIGATION / IRRIGATOR) ×3 IMPLANT
SHEARS HARMONIC ACE PLUS 36CM (ENDOMECHANICALS) ×3 IMPLANT
SUT VIC AB 2-0 CT1 (SUTURE) ×3 IMPLANT
SUT VIC AB 3-0 X1 27 (SUTURE) IMPLANT
SUT VICRYL 0 UR6 27IN ABS (SUTURE) ×3 IMPLANT
TOWEL OR 17X24 6PK STRL BLUE (TOWEL DISPOSABLE) ×6 IMPLANT
TRAY FOLEY CATH 14FR (SET/KITS/TRAYS/PACK) ×3 IMPLANT
TROCAR XCEL NON-BLD 11X100MML (ENDOMECHANICALS) ×3 IMPLANT
TROCAR XCEL NON-BLD 5MMX100MML (ENDOMECHANICALS) ×6 IMPLANT
TUBING INSUFFLATION W/FILTER (TUBING) ×3 IMPLANT
WARMER LAPAROSCOPE (MISCELLANEOUS) ×3 IMPLANT
WATER STERILE IRR 1000ML POUR (IV SOLUTION) IMPLANT

## 2013-12-25 NOTE — Anesthesia Postprocedure Evaluation (Signed)
Anesthesia Post Note  Patient: Frances Hill  Procedure(s) Performed: Procedure(s) (LRB): OPERATIVE LAPAROSCOPY (N/A) SALPINGO OOPHORECTOMY (Left)  Anesthesia type: General  Patient location: PACU  Post pain: Pain level controlled  Post assessment: Post-op Vital signs reviewed  Last Vitals:  Filed Vitals:   12/25/13 1600  BP: 121/70  Pulse: 78  Temp:   Resp: 20    Post vital signs: Reviewed  Level of consciousness: sedated  Complications: No apparent anesthesia complications

## 2013-12-25 NOTE — Op Note (Signed)
Frances Hill PROCEDURE DATE: 12/25/2013  PREOPERATIVE DIAGNOSES: Symptomatic left ovarian cyst  POSTOPERATIVE DIAGNOSES: Left ovarian possible endometrioma; Stage III endometriosis PROCEDURE: Laparoscopic left salpingoophorectomy SURGEON:  Dr. Verita Schneiders ASSISTANT: Dr. Silas [place] ANESTHESIOLOGIST: Dr. Josephine Igo  INDICATIONS: 28 y.o. G0Pwith aforementioned preoperative diagnoses here today for definitive surgical management.   Risks of surgery were discussed with the patient including but not limited to: bleeding which may require transfusion or reoperation; infection which may require antibiotics; injury to bowel, bladder, ureters or other surrounding organs; need for additional procedures including laparotomy; thromboembolic phenomenon, incisional problems and other postoperative/anesthesia complications. Written informed consent was obtained.  Of note, patient was scheduled for a cystectomy but was consented about possibility of oophorectomy given the size of her cyst.  FINDINGS:  Small fibroid uterus, normal appearing right adnexa.  8 cm enlarged left adnexa densely adherent to the pelvic sidewall, uterus and posterior cul-de-sac. Unable to appreciate any normal part of the left adnexa.   Cyst ruptured during surgery for a copious amount of red-brown fluid consistent with possible endometrioma.  Unable to visualize posterior cul-de-sac region due to multiple endometriosis lesions and adhesions.  Diffuse brown spots of endometriosis noted on anterior peritoneum.   ANESTHESIA:    General INTRAVENOUS FLUIDS: 1700 ml ESTIMATED BLOOD LOSS: 100 ml SPECIMENS:  Left ovary and fallopian tube in fragments COMPLICATIONS: None immediate  PROCEDURE IN DETAIL:  The patient received intravenous antibiotics and had sequential compression devices applied to her lower extremities while in the preoperative area.  She was then taken to the operating room where general anesthesia was administered and  was found to be adequate.  She was placed in the dorsal lithotomy position, and was prepped and draped in a sterile manner.  A Foley catheter was inserted into her bladder and attached to constant drainage and a uterine manipulator was then advanced into the uterus .   After an adequate timeout was performed, attention was turned to the abdomen where an umbilical incision was made with the scalpel.  The Optiview 11-mm trocar and sleeve were then advanced without difficulty with the laparoscope under direct visualization into the abdomen.  The abdomen was then insufflated with carbon dioxide gas and adequate pneumoperitoneum was obtained.  A survey of the patient's pelvis and abdomen revealed the findings above.  Bilateral 5-mm lower quadrant ports were then placed under direct visualization.   On the left side, the uteroovarian and infundibulopelvic ligaments were not able to visualized very well due to adhesions.  The adhesions/ligaments were serially clamped and transected with the Harmonic device; the left ovarian cyst was ruptured in the process for a large amount of red-brown thick fluid. This fluid was suctioned off, and the adhesions/ligaments were transected carefully allowing for salpingooophorectomy.  Good hemostasis was noted; adhesion remnants were noted on the left side of the uterus. The specimen was then removed from the abdomen through the 11-mm port using an Endocatch bag, under direct visualization.  The operative site was surveyed, and it was found to be hemostatic.  Surgicel and Interceed were placed on the area.  No intraoperative injury to other surrounding organs was noted.  The abdomen was desufflated and all instruments were then removed from the patient's abdomen. The fascial incision of the umbilicus was closed with a 0 Vicryl figure of eight stitch.  All skin incisions were closed with 3-0 Vicryl subcuticular stitches and Dermabond.   The patient will be discharged to home as per PACU  criteria.  Routine postoperative  instructions given.  She was prescribed Percocet, Ibuprofen and Colace.  She will follow up in the clinic on 01/14/14 for postoperative evaluation.   Verita Schneiders, MD, [place] Attending [place], [place]

## 2013-12-25 NOTE — Interval H&P Note (Signed)
History and Physical Interval Note  Frances Hill  has presented today for surgery, with the diagnosis of COMPLEX LEFT OVARIAN CYST  The various methods of treatment have been discussed with the patient and family. After consideration of risks, benefits and other options for treatment, the patient has consented to  [name] as a surgical intervention .  The patient's history has been reviewed, patient examined, no change in status, stable for surgery.  I have reviewed the patient's chart and labs.  Questions were answered to the patient's satisfaction.  To OR when ready.  Osborne Oman, MD 12/25/2013 12:27 PM

## 2013-12-25 NOTE — Discharge Instructions (Signed)
Laparoscopic Surgery - Care After Laparoscopy is a surgical procedure. It is used to diagnose and treat diseases inside the belly(abdomen). It is usually a brief, common, and relatively simple procedure. The laparoscopeis a thin, lighted, pencil-sized instrument. It is like a telescope. It is inserted into your abdomen through a small cut (incision). Your caregiver can look at the organs inside your body through this instrument.  She can see if there is anything abnormal. Laparoscopy can be done either in a hospital or outpatient clinic. You may be given a mild sedative to help you relax before the procedure. Once in the operating room, you will be given a drug to make you sleep (general anesthesia). Laparoscopy usually lasts about 1 hour. After the procedure, you will be monitored in a recovery area until you are stable and doing well. Once you are home, it may take 3 to 7 days to fully recover.  Laparoscopy has relatively few risks. Your caregiver will discuss the risks with you before the procedure. Some problems that can occur include: RISKS AND COMPLICATIONS  Allergies to medicines. Difficulty breathing. Bleeding. Infection. Damage to other surrounding structures HOME CARE INSTRUCTIONS  Infection. Bleeding. Damage to other organs. Anesthetic side effects.  Need for additional procedures such as open procedures/laparotomy PROCEDURE Once you receive anesthesia, your surgeon inflates the abdomen with a harmless gas (carbon dioxide). This makes the organs easier to see. The laparoscope is inserted into the abdomen through a small incision. This allows your surgeon to see into the abdomen. Other small instruments are also inserted into the abdomen through other small openings. Many surgeons attach a video camera to the laparoscope to enlarge the view. During a laparoscopy, the surgeon may be looking for inflammation, infection, or cancer.  The surgeon may also need to take out certain organs or  take tissue samples (biopsies). The specimens are sent to a specialist in looking at cells and tissue samples (pathologist). The pathologist examines them under a microscope to help to diagnose or confirm a disease. AFTER THE PROCEDURE  The incisions are closed with stitches (sutures) and Dermabond. Because these incisions are small (usually less than 1/2 inch), there is usually minimal discomfort after the procedure. There may also be discomfort from the instrument placement incisions in the abdomen. You will be given pain medicine to ease any discomfort. You will rest in a recovery room for 1-2 hours until you are stable and doing well. You may have some mild discomfort in the throat. This is from the tube placed in your throat while you were sleeping. You may experience discomfort in the shoulder area from some trapped air between the liver and diaphragm. This sensation is normal and will slowly go away on its own. The recovery time is shortened as long as there are no complications. You will rest in a recovery room until stable and doing well. As long as there are no complications, you may be allowed to go home. Someone will need to drive you home and be with you for at least 24 hours once home. FINDING OUT THE RESULTS You will be called with the results of the pathology and will discuss these results with  your caregiver during your postoperative appointment. Do not assume everything is normal if you have not heard from your caregiver or the medical facility. It is important for you to follow up on all of your results. HOME CARE INSTRUCTIONS  Take all medicines as directed. Only take over-the-counter or prescription medicines for pain, discomfort,   CARE INSTRUCTIONS   Take all medicines as directed.  Only take over-the-counter or prescription medicines for pain, discomfort, or fever as directed by your caregiver.  Resume daily activities as directed.  Showers are preferred over baths.  You may resume sexual activities in 1 week or as directed.  Do not drive while taking  narcotics. SEEK MEDICAL CARE IF:  There is increasing abdominal pain.  You feel lightheaded or faint.  You have the chills.  You have an oral temperature above 102 F (38.9 C).  There is pus-like (purulent) drainage from any of the wounds.  You are unable to pass gas or have a bowel movement.  You feel sick to your stomach (nauseous) or throw up (vomit). MAKE SURE YOU:   Understand these instructions.  Will watch your condition.  Will get help right away if you are not doing well or get worse.  ExitCare Patient Information 2013 [place]. Endometriosis Endometriosis is a condition in which the tissue that lines the uterus (endometrium) grows outside of its normal location. The tissue may grow in many locations close to the uterus, but it commonly grows on the ovaries, fallopian tubes, vagina, or bowel. Because the uterus expels, or sheds, its lining every menstrual cycle, there is bleeding wherever the endometrial tissue is located. This can cause pain because blood is irritating to tissues not normally exposed to it.  CAUSES  The cause of endometriosis is not known.  SIGNS AND SYMPTOMS  Often, there are no symptoms. When symptoms are present, they can vary with the location of the displaced tissue. Various symptoms can occur at different times. Although symptoms occur mainly during a woman's menstrual period, they can also occur midcycle and usually stop with menopause. Some people may go months with no symptoms at all. Symptoms may include:   Back or abdominal pain.   Heavier bleeding during periods.   Pain during intercourse.   Painful bowel movements.   Infertility. DIAGNOSIS  Your health care provider will do a physical exam and ask about your symptoms. Various tests may be done, such as:   Blood tests and urine tests. These are done to help rule out other problems.   Ultrasound. This test is done to look for abnormal tissue.   An X-ray of the lower  bowel (barium enema).  Laparoscopy. In this procedure, a thin, lighted tube with a tiny camera on the end (laparoscope) is inserted into your abdomen. This helps your health care provider look for abnormal tissue to confirm the diagnosis. The health care provider may also remove a small piece of tissue (biopsy) from any abnormal tissue found. This tissue sample can then be sent to a lab so it can be looked at under a microscope. TREATMENT  Treatment will vary and may include:   Medicines to relieve pain. Nonsteroidal anti-inflammatory drugs (NSAIDs) are a type of pain medicine that can help to relieve the pain caused by endometriosis.  Hormonal therapy. When using hormonal therapy, periods are eliminated. This eliminates the monthly exposure to blood by the displaced endometrial tissue.   Surgery. Surgery may sometimes be done to remove the abnormal endometrial tissue. In severe cases, surgery may be done to remove the fallopian tubes, uterus, and ovaries (hysterectomy). HOME CARE INSTRUCTIONS   Only take over-the-counter or prescription medicines for pain, discomfort, or fever as directed by your health care provider. Do not take aspirin because it may increase bleeding when you are not on hormonal therapy.   Avoid activities  that produce pain, including sexual activity. SEEK MEDICAL CARE IF:  You have pelvic pain before, after, or during your periods.  You have pelvic pain between periods that gets worse during your period.  You have pelvic pain during or after sex.  You have pelvic pain with bowel movements or urination, especially during your period.  You have problems getting pregnant. SEEK IMMEDIATE MEDICAL CARE IF:   Your pain is severe and is not responding to pain medicine.   You have severe nausea and vomiting, or you cannot keep foods down.   You have pain that is limited to the right lower part of your abdomen.   You have swelling or increasing pain in your  abdomen.   You see blood in your stool.   You have a fever or persistent symptoms for more than 2 3 days.   You have a fever and your symptoms suddenly get worse. MAKE SURE YOU:   Understand these instructions.  Will watch your condition.  Will get help right away if you are not doing well or get worse. Document Released: 09/16/2000 Document Revised: 07/10/2013 Document Reviewed: 05/17/2013 [facility] Patient Information 2014 [place].

## 2013-12-25 NOTE — H&P (View-Only) (Signed)
CLINIC ENCOUNTER NOTE  History:  28 y.o. G0 here today for discussion of management of complex left ovarian cyst.  She had an ER visit for this complaint; reports that pain is improved overall, but still occurs at night.  No N/V with the pain, no on-and-off pattern of the pain.  Pain is mostly constant when it occurs.  She is accompanied by her boyfriend.  The following portions of the patient's history were reviewed and updated as appropriate: allergies, current medications, past family history, past medical history, past social history, past surgical history and problem list.  Review of Systems:  Pertinent items are noted in HPI.  Objective:  Physical Exam BP 128/88  Pulse 72  Temp(Src) 98.4 F (36.9 C)  Ht 5\' 6"  (1.676 m)  Wt 248 lb 9.6 oz (112.764 kg)  BMI 40.14 kg/m2  LMP 11/28/2013 Gen: NAD Abd: Soft, obese, mild LLQ tenderness, no rebound, no guarding, nondistended Pelvic: Deferred  Labs and Imaging  12/05/2013  CT ABDOMEN AND PELVIS WITHOUT CONTRAST  CLINICAL DATA:  Left flank pain   COMPARISON:  None.  FINDINGS: BODY WALL: Unremarkable.  LOWER CHEST: Unremarkable.  ABDOMEN/PELVIS:  Liver: No focal abnormality.  Biliary: Mainly collapsed gallbladder. No biliary ductal dilatation.  Pancreas: Unremarkable.  Spleen: Unremarkable.  Adrenals: Unremarkable.  Kidneys and ureters: No hydronephrosis or stone.  Bladder: Unremarkable.  Reproductive: There are 2 masses in the left pelvis which appear discrete. These contact the left uterus. A normal ovary is not seen on the left, such that 1 of these masses is likely an enlarged ovary. The larger measures 6 cm in maximal dimension, the smaller 5 cm. The right ovary is seen with moderate certainty in the right hemipelvis, normal in size.  Bowel: No obstruction. Normal appendix.  Retroperitoneum: No mass or adenopathy.  Peritoneum: No free fluid or gas.  Vascular: No acute abnormality.  OSSEOUS: No acute abnormalities.  These results were  called by telephone at the time of interpretation on 12/05/2013 at 4:24 AM to Dr. Teressa Lower , who verbally acknowledged these results.  IMPRESSION: There are 2 masses in the left pelvis, 1 of which is presumably an enlarged ovary (the other likely a fibroid). Ovarian torsion is the diagnosis of exclusion in the setting of left flank pain. Pelvic ultrasound could confirm these findings.   Electronically Signed   By: Jorje Guild M.D.   On: 12/05/2013 04:29   12/05/2013     TRANSABDOMINAL AND TRANSVAGINAL ULTRASOUND OF PELVIS  DOPPLER  ULTRASOUND OF OVARIES  CLINICAL DATA:  Left lower quadrant pain. Mass seen on CT. Rule out torsion.  COMPARISON:  Abdominal CT from the same day  FINDINGS: Measurements: 8 x 4 x 5 cm. There are multiple (at least 3) heterogeneous, partly shadowing masses within the myometrium, measuring 1.4 cm in the sub serosal fundus and 2 to 2.4 cm in the posterior uterine body and lower uterine segment, respectively. There may be a larger, sub serosal fibroid not measured based on previous CT.  Endometrium  Thickness: 6 mm.  No focal abnormality visualized.  Right ovary  Measurements: 4.2 x 3.3 x 3.3 cm. Follicles noted within the ovary. There are also multiple (at least 3) nonvascular areas with homogeneous diffuse mid level echoes. These do not have echogenic foci within the walls.  Left ovary  Measurements: 5.5 x 6.3 x 7.2 cm. The ovary is enlarged by a 4.6 x 3 x 4.5 cm nonvascular mass with homogeneous diffuse mid level echoes, similar to  the contralateral ovary. There is an echogenic shadowing focus along the superficial surface. No evidence of hair or fat.  Pulsed Doppler evaluation of both ovaries demonstrates normal low-resistance arterial and venous waveforms.  Other findings  No free fluid.  IMPRESSION: 1. Enlarged (7cm) left ovary containing a nearly 5 cm cystic mass. Although there is currently blood flow to the left ovary, there may be intermittent/recent torsion as an explanation of  the patient's left lower quadrant pain. 2. The left ovarian mass is likely either a dermoid or endometrioma (endometrioma favored given similar appearing, smaller masses present in the right adnexa). 3. At least 3 uterine fibroids, up to 2 cm diameter.   Electronically Signed   By: Jorje Guild M.D.   On: 12/05/2013 06:08    Assessment & Plan:  5 cm left ovarian complex cystic mass; imaging concerned about dermoid vs endometrioma (favored diagnosis). No signs of torsion at this time.  Patient also has multiple fibroids; but she is asymptomatic from this standpoint, just wants "something done about the cyst".  Discussed management options: expectant management with follow up scan in about 6 weeks vs laparoscopic cystectomy.  Patient desires surgical management with laparoscopic left ovarian cystectomy.  The risks of surgery were discussed in detail with the patient including but not limited to: bleeding which may require transfusion or reoperation; infection which may require prolonged hospitalization or re-hospitalization and antibiotic therapy; injury to bowel, bladder, ureters and major vessels or other surrounding organs; need for additional procedures including laparotomy; thromboembolic phenomenon, incisional problems and other postoperative or anesthesia complications.  Patient was told that the likelihood that her condition and symptoms will be treated effectively with this surgical management was very high; the postoperative expectations were also discussed in detail. She was told that her fibroids will not be operated on unless they were noted to be involved in the left adnexal mass; this will be known at the time of surgery.  The patient also understands the alternative treatment options which were discussed in full. All questions were answered.  She was told that she will be contacted by our surgical scheduler regarding the time and date of her surgery; routine preoperative instructions of having  nothing to eat or drink after midnight on the day prior to surgery and also coming to the hospital 1.5 hours prior to her time of surgery were also emphasized.  She was told she may be called for a preoperative appointment about a week prior to surgery and will be given further preoperative instructions at that visit. Printed patient education handouts about the procedure were given to the patient to review at home.  Torsion precautions reviewed in detail with patient and her boyfriend.  Naproxen prescribed for pain.    Verita Schneiders, MD, [place] Attending [place], [place]

## 2013-12-25 NOTE — Anesthesia Preprocedure Evaluation (Addendum)
Anesthesia Evaluation  Patient identified by MRN, date of birth, ID band Patient awake    Reviewed: Allergy & Precautions, H&P , NPO status , Patient's Chart, lab work & pertinent test results  Airway Mallampati: III TM Distance: >3 FB Neck ROM: Full    Dental no notable dental hx. (+) Teeth Intact   Pulmonary neg pulmonary ROS,  breath sounds clear to auscultation  Pulmonary exam normal       Cardiovascular negative cardio ROS  Rhythm:Regular Rate:Normal     Neuro/Psych Anxiety negative neurological ROS     GI/Hepatic negative GI ROS, Neg liver ROS,   Endo/Other  Morbid obesity  Renal/GU negative Renal ROS  negative genitourinary   Musculoskeletal negative musculoskeletal ROS (+)   Abdominal (+) + obese,   Peds  Hematology negative hematology ROS (+)   Anesthesia Other Findings   Reproductive/Obstetrics Left Ovarian cyst                           Anesthesia Physical Anesthesia Plan  ASA: III  Anesthesia Plan: General   Post-op Pain Management:    Induction: Intravenous  Airway Management Planned: Oral ETT  Additional Equipment:   Intra-op Plan:   Post-operative Plan: Extubation in OR  Informed Consent: I have reviewed the patients History and Physical, chart, labs and discussed the procedure including the risks, benefits and alternatives for the proposed anesthesia with the patient or authorized representative who has indicated his/her understanding and acceptance.   Dental advisory given  Plan Discussed with: CRNA, Anesthesiologist and Surgeon  Anesthesia Plan Comments:         Anesthesia Quick Evaluation

## 2013-12-25 NOTE — Transfer of Care (Signed)
Immediate Anesthesia Transfer of Care Note  Patient: Frances Hill  Procedure(s) Performed: Procedure(s): OPERATIVE LAPAROSCOPY (N/A) SALPINGO OOPHORECTOMY (Left)  Patient Location: PACU  Anesthesia Type:General  Level of Consciousness: awake  Airway & Oxygen Therapy: Patient Spontanous Breathing and Patient connected to nasal cannula oxygen  Post-op Assessment: Report given to PACU RN and Post -op Vital signs reviewed and stable  Post vital signs: stable  Complications: No apparent anesthesia complications

## 2013-12-26 ENCOUNTER — Encounter (HOSPITAL_COMMUNITY): Payer: Self-pay | Admitting: Obstetrics & Gynecology

## 2013-12-26 ENCOUNTER — Telehealth: Payer: Self-pay | Admitting: *Deleted

## 2013-12-26 NOTE — Telephone Encounter (Signed)
Patient called to inquire about her return to work date. I discussed with Dr. Harolyn Rutherford and she stated patient can be out of work until the day after her post op which will be on 01/14/14. FMLA forms updated and faxed to her job. Pt aware. Had no further questions.

## 2013-12-30 ENCOUNTER — Telehealth: Payer: Self-pay | Admitting: *Deleted

## 2013-12-30 NOTE — Telephone Encounter (Signed)
Called Frances Hill and notifed of benign pathology results.  No questions voiced.

## 2013-12-30 NOTE — Telephone Encounter (Signed)
Message copied by Samuel Germany on Mon Dec 30, 2013 10:02 AM ------      Message from: Osborne Oman      Created: Sat Dec 28, 2013 10:26 PM       Benign pathology.  Please call to inform patient of results.       ------

## 2014-01-01 ENCOUNTER — Telehealth: Payer: Self-pay | Admitting: *Deleted

## 2014-01-01 NOTE — Telephone Encounter (Signed)
Patient called nurse line requesting a call back concerning dizziness and tingling in hands after surgery.  Spoke with patient about symptoms she is experiencing.  Informed her that the hands tingling may be from position during procedure and reviewed blood levels before surgery with patient.  Pt states she started cycle the day of surgery and bleed heavy for a couple of days.  Denies bleeding today.  States yesterday she had a bad headache, several dizzy spells and tingling in hands.  Encouraged patient increase activity slowly and signs/symtoms to come to [facility].  Patient states she may go to her regular doctor for these symptoms./

## 2014-01-14 ENCOUNTER — Encounter: Payer: Self-pay | Admitting: Obstetrics & Gynecology

## 2014-01-14 ENCOUNTER — Ambulatory Visit (INDEPENDENT_AMBULATORY_CARE_PROVIDER_SITE_OTHER): Payer: 59 | Admitting: Obstetrics & Gynecology

## 2014-01-14 VITALS — BP 139/89 | HR 83 | Ht 66.0 in | Wt 250.8 lb

## 2014-01-14 DIAGNOSIS — B373 Candidiasis of vulva and vagina: Secondary | ICD-10-CM

## 2014-01-14 DIAGNOSIS — Z09 Encounter for follow-up examination after completed treatment for conditions other than malignant neoplasm: Secondary | ICD-10-CM

## 2014-01-14 DIAGNOSIS — Z803 Family history of malignant neoplasm of breast: Secondary | ICD-10-CM

## 2014-01-14 DIAGNOSIS — N809 Endometriosis, unspecified: Secondary | ICD-10-CM

## 2014-01-14 DIAGNOSIS — B3731 Acute candidiasis of vulva and vagina: Secondary | ICD-10-CM

## 2014-01-14 MED ORDER — FLUCONAZOLE 150 MG PO TABS: 150.0000 mg | ORAL_TABLET | ORAL | Status: DC

## 2014-01-14 MED ORDER — NORGESTIMATE-ETH ESTRADIOL 0.25-35 MG-MCG PO TABS: 1.0000 | ORAL_TABLET | Freq: Every day | ORAL | Status: DC

## 2014-01-14 NOTE — Progress Notes (Signed)
    Subjective:     Frances Hill is a 28 y.o.  G55 female who presents to the clinic 3 weeks status post laparoscopy and LSO for endometriosis. Eating a regular diet without difficulty. Bowel movements are normal. The patient is not having any pain.  The following portions of the patient's history were reviewed and updated as appropriate: allergies, current medications, past family history, past medical history, past social history, past surgical history and problem list.  Review of Systems Pertinent items are noted in HPI.     Objective:    BP 139/89  Pulse 83  Ht $R'5\' 6"'Qz$  (1.676 m)  Wt 250 lb 12.8 oz (113.762 kg)  BMI 40.50 kg/m2  LMP 12/25/2013 General:  alert and no distress  Abdomen: soft, bowel sounds active, non-tender  Laparoscopic incision:   healing well, no drainage, no erythema, no hernia, no seroma, no swelling, no dehiscence, incision well approximated     Assessment:   Doing well postoperatively. Operative findings again reviewed. Pathology report discussed.    Plan:   1. Continue any current medications. Discussed management of endometriosis, patient elects for OCPs. This was prescribed. 2. Wound care discussed. 3. Activity restrictions: none 4. Anticipated return to work: now. 5. Follow up: for annual exam 6.Mammogram scheduled for FH of mother dying at age 86 of breast cancer, also breast cancer in maternal aunt. Interested in BRCA testing, will be done during her annual exam.   Frances Schneiders, MD, [place] Attending [place], [place]

## 2014-01-14 NOTE — Patient Instructions (Addendum)
BRCA-1 and BRCA-2 BRCA-1 and BRCA-2 are 2 genes that are linked with hereditary breast and ovarian cancers. About 200,000 women are diagnosed with invasive breast cancer each year and about 23,000 with ovarian cancer (according to the American Cancer Society). Of these cancers, about 5% to 10% will be due to a mutation in one of the BRCA genes. Men can also inherit an increased risk of developing breast cancer, primarily from an alteration in the BRCA-2 gene.  Individuals with mutations in BRCA1 or BRCA2 have significantly elevated risks for breast cancer (up to 80% lifetime risk), ovarian cancer (up to 40% lifetime risk), bilateral breast cancer and other types of cancers. BRCA mutations are inherited and passed from generation to generation. One half of the time, they are passed from the father's side of the family.  The DNA in white blood cells is used to detect mutations in the BRCA genes. While the gene products (proteins) of the BRCA genes act only in breast and ovarian tissue, the genes are present in every cell of the body and blood is the most easily accessible source of that DNA. PREPARATION FOR TEST The test for BRCA mutations is done on a blood sample collected by needle from a vein in the arm. The test does not require surgical biopsy of breast or ovarian tissue.  NORMAL FINDINGS No genetic mutations. Ranges for normal findings may vary among different laboratories and hospitals. You should always check with your doctor after having lab work or other tests done to discuss the meaning of your test results and whether your values are considered within normal limits. MEANING OF TEST  Your caregiver will go over the test results with you and discuss the importance and meaning of your results, as well as treatment options and the need for additional tests if necessary. OBTAINING THE TEST RESULTS It is your responsibility to obtain your test results. Ask the lab or department performing the test  when and how you will get your results. OTHER THINGS TO KNOW Your test results may have implications for other family members. When one member of a family is tested for BRCA mutations, issues often arise about how or whether to share this information with other family members. Seek advice from a genetic counselor about communication of result with your family members.  Pre and post test consultation with a health care provider knowledgeable about genetic testing cannot be overemphasized.  There are many issues to be considered when preparing for a genetic test and upon learning the results, and a genetic counselor has the knowledge and experience to help you sort through them.  If the BRCA test is positive, the options include increased frequency of check-ups (e.g., mammography, blood tests for CA-125, or transvaginal ultrasonography); medications that could reduce risk (e.g., oral contraceptives or tamoxifen); or surgical removal of the ovaries or breasts. There are a number of variables involved and it is important to discuss your options with your doctor and genetic counselor. Research studies have reported that for every 1000 women negative for BRCA mutations, between 12 and 45 of them will develop breast cancer by age 50 and between 3 and 4 will develop ovarian cancer by age 50. The risk increases with age. The test can be ordered by a doctor, preferably by one who can also offer genetic counseling. The blood sample will be sent to a laboratory that specializes in BRCA testing. The American Society of Clinical Oncology and the National Breast Cancer Coalition encourage women seeking the   test to participate in long-term outcome studies to help gather information on the effectiveness of different check-up and treatment options. Document Released: 10/13/2004 Document Revised: 12/12/2011 Document Reviewed: 08/25/2008 [facility] Patient Information 2014 [place], [place].  Return to clinic for any scheduled  appointments or for any gynecologic concerns as needed.   Thank you for enrolling in [place]. Please follow the instructions below to securely access your online medical record. MyChart allows you to send messages to your doctor, view your test results, manage appointments, and more.   How Do I Sign Up? 1. In your Internet browser, go to [employer] and enter [email or web address]. 2. Click on the Sign Up Now link in the Sign In box. You will see the New Member Sign Up page. 3. Enter your MyChart Access Code exactly as it appears below. You will not need to use this code after you've completed the sign-up process. If you do not sign up before the expiration date, you must request a new code.  MyChart Access Code: [email or web address] Expires: 02/03/2014  7:50 AM  4. Enter your Social Security Number ([email or web address]) and Date of Birth (mm/dd/yyyy) as indicated and click Submit. You will be taken to the next sign-up page. 5. Create a MyChart ID. This will be your MyChart login ID and cannot be changed, so think of one that is secure and easy to remember. 6. Create a MyChart password. You can change your password at any time. 7. Enter your Password Reset Question and Answer. This can be used at a later time if you forget your password.  8. Enter your e-mail address. You will receive e-mail notification when new information is available in [place]. 9. Click Sign Up. You can now view your medical record.   Additional Information Remember, MyChart is NOT to be used for urgent needs. For medical emergencies, dial 911.

## 2014-01-24 ENCOUNTER — Encounter (HOSPITAL_COMMUNITY): Payer: Self-pay | Admitting: *Deleted

## 2014-01-24 ENCOUNTER — Telehealth: Payer: Self-pay | Admitting: *Deleted

## 2014-01-24 ENCOUNTER — Inpatient Hospital Stay (HOSPITAL_COMMUNITY)
Admission: AD | Admit: 2014-01-24 | Discharge: 2014-01-24 | Disposition: A | Discharge status: Home / Self Care | Payer: 59 | Source: Ambulatory Visit | Attending: Obstetrics & Gynecology | Admitting: Obstetrics & Gynecology

## 2014-01-24 DIAGNOSIS — Z8742 Personal history of other diseases of the female genital tract: Secondary | ICD-10-CM | POA: Insufficient documentation

## 2014-01-24 DIAGNOSIS — N938 Other specified abnormal uterine and vaginal bleeding: Secondary | ICD-10-CM | POA: Insufficient documentation

## 2014-01-24 DIAGNOSIS — N920 Excessive and frequent menstruation with regular cycle: Secondary | ICD-10-CM | POA: Insufficient documentation

## 2014-01-24 DIAGNOSIS — N949 Unspecified condition associated with female genital organs and menstrual cycle: Secondary | ICD-10-CM | POA: Insufficient documentation

## 2014-01-24 DIAGNOSIS — D573 Sickle-cell trait: Secondary | ICD-10-CM | POA: Insufficient documentation

## 2014-01-24 HISTORY — DX: Endometriosis, unspecified: N80.9

## 2014-01-24 HISTORY — DX: Sickle-cell trait: D57.3

## 2014-01-24 LAB — CBC
HCT: 36.3 % (ref 36.0–46.0)
Hemoglobin: 12.1 g/dL (ref 12.0–15.0)
MCH: 24.9 pg — ABNORMAL LOW (ref 26.0–34.0)
MCHC: 33.3 g/dL (ref 30.0–36.0)
MCV: 74.7 fL — ABNORMAL LOW (ref 78.0–100.0)
Platelets: 338 10*3/uL (ref 150–400)
RBC: 4.86 MIL/uL (ref 3.87–5.11)
RDW: 14.1 % (ref 11.5–15.5)
WBC: 8.4 10*3/uL (ref 4.0–10.5)

## 2014-01-24 LAB — POCT PREGNANCY, URINE: Preg Test, Ur: NEGATIVE

## 2014-01-24 NOTE — Discharge Instructions (Signed)
Take 2 birth control pills each day until the bleeding stops. Then take one pill each day.    Abnormal Uterine Bleeding Abnormal uterine bleeding can affect women at various stages in life, including teenagers, women in their reproductive years, pregnant women, and women who have reached menopause. Several kinds of uterine bleeding are considered abnormal, including:  Bleeding or spotting between periods.   Bleeding after sexual intercourse.   Bleeding that is heavier or more than normal.   Periods that last longer than usual.  Bleeding after menopause.  Many cases of abnormal uterine bleeding are minor and simple to treat, while others are more serious. Any type of abnormal bleeding should be evaluated by your health care provider. Treatment will depend on the cause of the bleeding. HOME CARE INSTRUCTIONS Monitor your condition for any changes. The following actions may help to alleviate any discomfort you are experiencing:  Avoid the use of tampons and douches as directed by your health care provider.  Change your pads frequently. You should get regular pelvic exams and Pap tests. Keep all follow-up appointments for diagnostic tests as directed by your health care provider.  SEEK MEDICAL CARE IF:   Your bleeding lasts more than 1 week.   You feel dizzy at times.  SEEK IMMEDIATE MEDICAL CARE IF:   You pass out.   You are changing pads every 15 to 30 minutes.   You have abdominal pain.  You have a fever.   You become sweaty or weak.   You are passing large blood clots from the vagina.   You start to feel nauseous and vomit. MAKE SURE YOU:   Understand these instructions.  Will watch your condition.  Will get help right away if you are not doing well or get worse. Document Released: 09/19/2005 Document Revised: 05/22/2013 Document Reviewed: 04/18/2013 [facility] Patient Information 2014 [place], [place].

## 2014-01-24 NOTE — Telephone Encounter (Addendum)
31  Pt left message stating that she had recent surgery for endometriosis. She is now having a period and is experiencing large clots, wants to know if she needs appt, labs, etc.  I consulted with Dr. Harolyn Rutherford and she advised pt may take 2 OCP's daily until the bleeding/clots stop, then resume 1 pill daily.  I called pt back in order to discuss her concern and assess the amount of bleeding and clots she is having. I left voice mail that I will call back prior to 1pm today.  [place] pt again and left a detailed message stating the advice from Dr. Harolyn Rutherford. I also stated that if pt experiences heavy bleeding which is one or more pads per hour for 3 consecutive hours, she should go to [facility] for evaluation. She may call back if she has additional questions.

## 2014-01-24 NOTE — MAU Provider Note (Signed)
History     CSN: [id number]  Arrival date and time: 01/24/14 1739   First Provider Initiated Contact with Patient 01/24/14 1817      Chief Complaint  Patient presents with  . Vaginal Bleeding   Vaginal Bleeding    Frances Hill is a 28 y.o. G0P0000 who presents today with vaginal bleeding. She had an ovarian cyst removed on 12/25/13. She states that her period started two days ago, and then today she started to bleed very heavily. She states that she has been passing clots the size of an egg. She called the office and was told to take 2 birth control pills until the bleeding stopped and then take one birth control pill each day. She states that she did not get the message.   Past Medical History  Diagnosis Date  . Sexually transmitted disease (STD)     hx/o trichomonas  . Anxiety   . UTI (urinary tract infection) 2007    hospitalization  . Hyperhydrosis disorder     axilla  . Bacterial vaginosis     history of  . Obesity   . Sickle cell trait   . Endometriosis     Past Surgical History  Procedure Laterality Date  . Laparoscopy N/A 12/25/2013    Procedure: OPERATIVE LAPAROSCOPY;  Surgeon: Osborne Oman, MD;  Location: [facility];  Service: Gynecology;  Laterality: N/A;  . Salpingoophorectomy Left 12/25/2013    Procedure: SALPINGO OOPHORECTOMY;  Surgeon: Osborne Oman, MD;  Location: [facility];  Service: Gynecology;  Laterality: Left;    Family History  Problem Relation Age of Onset  . Aneurysm Mother 15    died of aneurysm  . Cancer Mother 36    breast, twice  . Hypertension Father   . Depression Father   . Heart disease Father     valve disease  . Cancer Maternal Aunt     breast cancer  . Diabetes Neg Hx   . Stroke Neg Hx     History  Substance Use Topics  . Smoking status: Never Smoker   . Smokeless tobacco: Not on file  . Alcohol Use: 0.6 oz/week    1 Cans of beer per week    Allergies: No Known Allergies  Prescriptions prior to admission   Medication Sig Dispense Refill  . docusate sodium (COLACE) 100 MG capsule Take 1 capsule (100 mg total) by mouth 2 (two) times daily as needed.  30 capsule  2  . Ferrous Sulfate (IRON) 325 (65 FE) MG TABS Take 1 tablet by mouth daily.      . fluconazole (DIFLUCAN) 150 MG tablet Take 1 tablet (150 mg total) by mouth every 3 (three) days.  3 tablet  1  . ibuprofen (ADVIL,MOTRIN) 600 MG tablet Take 1 tablet (600 mg total) by mouth every 6 (six) hours as needed.  30 tablet  1  . naproxen sodium (ANAPROX) 220 MG tablet Take 440 mg by mouth 2 (two) times daily as needed (for menstrual cramps).      . norgestimate-ethinyl estradiol (ORTHO-CYCLEN,SPRINTEC,PREVIFEM) 0.25-35 MG-MCG tablet Take 1 tablet by mouth daily.  3 Package  4  . oxyCODONE-acetaminophen (PERCOCET/ROXICET) 5-325 MG per tablet Take 1-2 tablets by mouth every 6 (six) hours as needed.  30 tablet  0    Review of Systems  Genitourinary: Positive for vaginal bleeding.   Physical Exam   Blood pressure 122/78, pulse 85, temperature 98.8 F (37.1 C), temperature source Oral, resp. rate 18, last menstrual period 01/23/2014.  Physical Exam  Nursing note and vitals reviewed. Constitutional: She is oriented to person, place, and time. She appears well-developed and well-nourished. No distress.  Cardiovascular: Normal rate.   Respiratory: Effort normal.  GI: Soft. There is no tenderness.  Genitourinary:   External: no lesion Vagina: small amount of blood seen  Cervix: pink, smooth, no CMT Uterus: NSSC Adnexa: NT   Neurological: She is alert and oriented to person, place, and time.  Skin: Skin is warm and dry.  Psychiatric: She has a normal mood and affect.    MAU Course  Procedures  Results for orders placed during the hospital encounter of 01/24/14 (from the past 24 hour(s))  CBC     Status: Abnormal   Collection Time    01/24/14  5:55 PM      Result Value Ref Range   WBC 8.4  4.0 - 10.5 K/uL   RBC 4.86  3.87 - 5.11  MIL/uL   Hemoglobin 12.1  12.0 - 15.0 g/dL   HCT 36.3  36.0 - 46.0 %   MCV 74.7 (*) 78.0 - 100.0 fL   MCH 24.9 (*) 26.0 - 34.0 pg   MCHC 33.3  30.0 - 36.0 g/dL   RDW 14.1  11.5 - 15.5 %   Platelets 338  150 - 400 K/uL  POCT PREGNANCY, URINE     Status: None   Collection Time    01/24/14  6:46 PM      Result Value Ref Range   Preg Test, Ur NEGATIVE  NEGATIVE     Assessment and Plan   1. Menorrhagia    Take 2 birth control pills each day until the bleeding stops Then take one each day Patient states that she has an open pack of pills and refills available already  Follow-up Information   Follow up with [facility]. (As needed)    Specialty:  Obstetrics and Gynecology   Contact information:   [place] [id number] [address] 01/24/2014, 6:50 PM

## 2014-01-24 NOTE — MAU Note (Signed)
Had laparoscopic: left ovary removed- had cyst, dx with endometriosis.  Started bleeding cycle.  Really heavy today, passing large clots.  Has changed 10 time today, twice in last hour.

## 2014-01-24 NOTE — MAU Note (Signed)
Laparascopic surgery on March 25.

## 2014-02-21 ENCOUNTER — Ambulatory Visit (INDEPENDENT_AMBULATORY_CARE_PROVIDER_SITE_OTHER): Payer: 59 | Admitting: Obstetrics & Gynecology

## 2014-02-21 ENCOUNTER — Encounter: Payer: Self-pay | Admitting: Obstetrics & Gynecology

## 2014-02-21 VITALS — BP 124/81 | HR 71 | Temp 98.7°F | Ht 65.0 in | Wt 250.1 lb

## 2014-02-21 DIAGNOSIS — B9689 Other specified bacterial agents as the cause of diseases classified elsewhere: Secondary | ICD-10-CM

## 2014-02-21 DIAGNOSIS — Z01419 Encounter for gynecological examination (general) (routine) without abnormal findings: Secondary | ICD-10-CM

## 2014-02-21 DIAGNOSIS — A499 Bacterial infection, unspecified: Secondary | ICD-10-CM

## 2014-02-21 DIAGNOSIS — N760 Acute vaginitis: Secondary | ICD-10-CM

## 2014-02-21 DIAGNOSIS — Z113 Encounter for screening for infections with a predominantly sexual mode of transmission: Secondary | ICD-10-CM

## 2014-02-21 DIAGNOSIS — N809 Endometriosis, unspecified: Secondary | ICD-10-CM

## 2014-02-21 DIAGNOSIS — Z0000 Encounter for general adult medical examination without abnormal findings: Secondary | ICD-10-CM

## 2014-02-21 NOTE — Patient Instructions (Addendum)
RE: MyChart  Dear Ms. Sabra Heck  We are excited to introduce MyChart, a new best-in-class service that provides you online access to important information in your electronic medical record. We want to make it easier for you to view your health information - all in one secure location - when and where you need it. We expect MyChart will enhance the quality of care and service we provide. Use the activation code below to enroll in MyChart online at [email or web address]  When you register for MyChart, you can:    View your test results.   Communicate securely with your physician's office.    View your medical history, allergies, medications, and immunizations.   Conveniently print information such as your medication lists.  If you are age 27 or older and want a member of your family to have access to your record, you must provide written consent by completing a proxy form available at our facility. Please speak to our clinical staff about guidelines regarding accounts for patients younger than age 9.  As you activate your MyChart account and need any technical assistance, please call the MyChart technical support line at [phone]) or email your question to [email or web address]. If you email your question(s), please include your name, a return phone number and the best time to reach you.  Thank you for using MyChart as your new health and wellness resource!  MyChart Activation Code:  [email or web address] Expires: 04/22/2014 10:33 AM     Preventive Care for Adults, Female A healthy lifestyle and preventive care can promote health and wellness. Preventive health guidelines for women include the following key practices.  A routine yearly physical is a good way to check with your health care provider about your health and preventive screening. It is a chance to share any concerns and updates on your health and to receive a thorough exam.  Visit your dentist for a  routine exam and preventive care every 6 months. Brush your teeth twice a day and floss once a day. Good oral hygiene prevents tooth decay and gum disease.  The frequency of eye exams is based on your age, health, family medical history, use of contact lenses, and other factors. Follow your health care provider's recommendations for frequency of eye exams.  Eat a healthy diet. Foods like vegetables, fruits, whole grains, low-fat dairy products, and lean protein foods contain the nutrients you need without too many calories. Decrease your intake of foods high in solid fats, added sugars, and salt. Eat the right amount of calories for you.Get information about a proper diet from your health care provider, if necessary.  Regular physical exercise is one of the most important things you can do for your health. Most adults should get at least 150 minutes of moderate-intensity exercise (any activity that increases your heart rate and causes you to sweat) each week. In addition, most adults need muscle-strengthening exercises on 2 or more days a week.  Maintain a healthy weight. The body mass index (BMI) is a screening tool to identify possible weight problems. It provides an estimate of body fat based on height and weight. Your health care provider can find your BMI, and can help you achieve or maintain a healthy weight.For adults 20 years and older:  A BMI below 18.5 is considered underweight.  A BMI of 18.5 to 24.9 is normal.  A BMI of 25 to 29.9 is considered overweight.  A BMI of 30 and above is considered obese.  Maintain normal blood lipids  and cholesterol levels by exercising and minimizing your intake of saturated fat. Eat a balanced diet with plenty of fruit and vegetables. Blood tests for lipids and cholesterol should begin at age 36 and be repeated every 5 years. If your lipid or cholesterol levels are high, you are over 50, or you are at high risk for heart disease, you may need your  cholesterol levels checked more frequently.Ongoing high lipid and cholesterol levels should be treated with medicines if diet and exercise are not working.  If you smoke, find out from your health care provider how to quit. If you do not use tobacco, do not start.  Lung cancer screening is recommended for adults aged 22 80 years who are at high risk for developing lung cancer because of a history of smoking. A yearly low-dose CT scan of the lungs is recommended for people who have at least a 30-pack-year history of smoking and are a current smoker or have quit within the past 15 years. A pack year of smoking is smoking an average of 1 pack of cigarettes a day for 1 year (for example: 1 pack a day for 30 years or 2 packs a day for 15 years). Yearly screening should continue until the smoker has stopped smoking for at least 15 years. Yearly screening should be stopped for people who develop a health problem that would prevent them from having lung cancer treatment.  If you are pregnant, do not drink alcohol. If you are breastfeeding, be very cautious about drinking alcohol. If you are not pregnant and choose to drink alcohol, do not have more than 1 drink per day. One drink is considered to be 12 ounces (355 mL) of beer, 5 ounces (148 mL) of wine, or 1.5 ounces (44 mL) of liquor.  Avoid use of street drugs. Do not share needles with anyone. Ask for help if you need support or instructions about stopping the use of drugs.  High blood pressure causes heart disease and increases the risk of stroke. Your blood pressure should be checked at least every 1 to 2 years. Ongoing high blood pressure should be treated with medicines if weight loss and exercise do not work.  If you are 22 27 years old, ask your health care provider if you should take aspirin to prevent strokes.  Diabetes screening involves taking a blood sample to check your fasting blood sugar level. This should be done once every 3 years, after  age 55, if you are within normal weight and without risk factors for diabetes. Testing should be considered at a younger age or be carried out more frequently if you are overweight and have at least 1 risk factor for diabetes.  Breast cancer screening is essential preventive care for women. You should practice "breast self-awareness." This means understanding the normal appearance and feel of your breasts and may include breast self-examination. Any changes detected, no matter how small, should be reported to a health care provider. Women in their 91s and 30s should have a clinical breast exam (CBE) by a health care provider as part of a regular health exam every 1 to 3 years. After age 84, women should have a CBE every year. Starting at age 49, women should consider having a mammogram (breast X-ray test) every year. Women who have a family history of breast cancer should talk to their health care provider about genetic screening. Women at a high risk of breast cancer should talk to their health care providers about having  an MRI and a mammogram every year.  Breast cancer gene (BRCA)-related cancer risk assessment is recommended for women who have family members with BRCA-related cancers. BRCA-related cancers include breast, ovarian, tubal, and peritoneal cancers. Having family members with these cancers may be associated with an increased risk for harmful changes (mutations) in the breast cancer genes BRCA1 and BRCA2. Results of the assessment will determine the need for genetic counseling and BRCA1 and BRCA2 testing.  The Pap test is a screening test for cervical cancer. A Pap test can show cell changes on the cervix that might become cervical cancer if left untreated. A Pap test is a procedure in which cells are obtained and examined from the lower end of the uterus (cervix).  Women should have a Pap test starting at age 22.  Between ages 11 and 36, Pap tests should be repeated every 2  years.  Beginning at age 46, you should have a Pap test every 3 years as long as the past 3 Pap tests have been normal.  Some women have medical problems that increase the chance of getting cervical cancer. Talk to your health care provider about these problems. It is especially important to talk to your health care provider if a new problem develops soon after your last Pap test. In these cases, your health care provider may recommend more frequent screening and Pap tests.  The above recommendations are the same for women who have or have not gotten the vaccine for human papillomavirus (HPV).  If you had a hysterectomy for a problem that was not cancer or a condition that could lead to cancer, then you no longer need Pap tests. Even if you no longer need a Pap test, a regular exam is a good idea to make sure no other problems are starting.  If you are between ages 58 and 59 years, and you have had normal Pap tests going back 10 years, you no longer need Pap tests. Even if you no longer need a Pap test, a regular exam is a good idea to make sure no other problems are starting.  If you have had past treatment for cervical cancer or a condition that could lead to cancer, you need Pap tests and screening for cancer for at least 20 years after your treatment.  If Pap tests have been discontinued, risk factors (such as a new sexual partner) need to be reassessed to determine if screening should be resumed.  The HPV test is an additional test that may be used for cervical cancer screening. The HPV test looks for the virus that can cause the cell changes on the cervix. The cells collected during the Pap test can be tested for HPV. The HPV test could be used to screen women aged 33 years and older, and should be used in women of any age who have unclear Pap test results. After the age of 70, women should have HPV testing at the same frequency as a Pap test.  Colorectal cancer can be detected and often  prevented. Most routine colorectal cancer screening begins at the age of 49 years and continues through age 35 years. However, your health care provider may recommend screening at an earlier age if you have risk factors for colon cancer. On a yearly basis, your health care provider may provide home test kits to check for hidden blood in the stool. Use of a small camera at the end of a tube, to directly examine the colon (sigmoidoscopy or colonoscopy),  can detect the earliest forms of colorectal cancer. Talk to your health care provider about this at age 48, when routine screening begins. Direct exam of the colon should be repeated every 5 10 years through age 62 years, unless early forms of pre-cancerous polyps or small growths are found.  People who are at an increased risk for hepatitis B should be screened for this virus. You are considered at high risk for hepatitis B if:  You were born in a country where hepatitis B occurs often. Talk with your health care provider about which countries are considered high risk.  Your parents were born in a high-risk country and you have not received a shot to protect against hepatitis B (hepatitis B vaccine).  You have HIV or AIDS.  You use needles to inject street drugs.  You live with, or have sex with, someone who has Hepatitis B.  You get hemodialysis treatment.  You take certain medicines for conditions like cancer, organ transplantation, and autoimmune conditions.  Hepatitis C blood testing is recommended for all people born from [birth year] through 1964 and any individual with known risks for hepatitis C.  Practice safe sex. Use condoms and avoid high-risk sexual practices to reduce the spread of sexually transmitted infections (STIs). STIs include gonorrhea, chlamydia, syphilis, trichomonas, herpes, HPV, and human immunodeficiency virus (HIV). Herpes, HIV, and HPV are viral illnesses that have no cure. They can result in disability, cancer, and death.  Sexually active women aged 43 years and younger should be checked for chlamydia. Older women with new or multiple partners should also be tested for chlamydia. Testing for other STIs is recommended if you are sexually active and at increased risk.  Osteoporosis is a disease in which the bones lose minerals and strength with aging. This can result in serious bone fractures or breaks. The risk of osteoporosis can be identified using a bone density scan. Women ages 59 years and over and women at risk for fractures or osteoporosis should discuss screening with their health care providers. Ask your health care provider whether you should take a calcium supplement or vitamin D to reduce the rate of osteoporosis.  Menopause can be associated with physical symptoms and risks. Hormone replacement therapy is available to decrease symptoms and risks. You should talk to your health care provider about whether hormone replacement therapy is right for you.  Use sunscreen. Apply sunscreen liberally and repeatedly throughout the day. You should seek shade when your shadow is shorter than you. Protect yourself by wearing long sleeves, pants, a wide-brimmed hat, and sunglasses year round, whenever you are outdoors.  Once a month, do a whole body skin exam, using a mirror to look at the skin on your back. Tell your health care provider of new moles, moles that have irregular borders, moles that are larger than a pencil eraser, or moles that have changed in shape or color.  Stay current with required vaccines (immunizations).  Influenza vaccine. All adults should be immunized every year.  Tetanus, diphtheria, and acellular pertussis (Td, Tdap) vaccine. Pregnant women should receive 1 dose of Tdap vaccine during each pregnancy. The dose should be obtained regardless of the length of time since the last dose. Immunization is preferred during the 27th 36th week of gestation. An adult who has not previously received Tdap or  who does not know her vaccine status should receive 1 dose of Tdap. This initial dose should be followed by tetanus and diphtheria toxoids (Td) booster doses every 10 years.  Adults with an unknown or incomplete history of completing a 3-dose immunization series with Td-containing vaccines should begin or complete a primary immunization series including a Tdap dose. Adults should receive a Td booster every 10 years.  Varicella vaccine. An adult without evidence of immunity to varicella should receive 2 doses or a second dose if she has previously received 1 dose. Pregnant females who do not have evidence of immunity should receive the first dose after pregnancy. This first dose should be obtained before leaving the health care facility. The second dose should be obtained 4 8 weeks after the first dose.  Human papillomavirus (HPV) vaccine. Females aged 40 26 years who have not received the vaccine previously should obtain the 3-dose series. The vaccine is not recommended for use in pregnant females. However, pregnancy testing is not needed before receiving a dose. If a female is found to be pregnant after receiving a dose, no treatment is needed. In that case, the remaining doses should be delayed until after the pregnancy. Immunization is recommended for any person with an immunocompromised condition through the age of 8 years if she did not get any or all doses earlier. During the 3-dose series, the second dose should be obtained 4 8 weeks after the first dose. The third dose should be obtained 24 weeks after the first dose and 16 weeks after the second dose.  Zoster vaccine. One dose is recommended for adults aged 55 years or older unless certain conditions are present.  Measles, mumps, and rubella (MMR) vaccine. Adults born before [birth year] generally are considered immune to measles and mumps. Adults born in [birth year] or later should have 1 or more doses of MMR vaccine unless there is a contraindication to the  vaccine or there is laboratory evidence of immunity to each of the three diseases. A routine second dose of MMR vaccine should be obtained at least 28 days after the first dose for students attending postsecondary schools, health care workers, or international travelers. People who received inactivated measles vaccine or an unknown type of measles vaccine during 1963 1967 should receive 2 doses of MMR vaccine. People who received inactivated mumps vaccine or an unknown type of mumps vaccine before 1979 and are at high risk for mumps infection should consider immunization with 2 doses of MMR vaccine. For females of childbearing age, rubella immunity should be determined. If there is no evidence of immunity, females who are not pregnant should be vaccinated. If there is no evidence of immunity, females who are pregnant should delay immunization until after pregnancy. Unvaccinated health care workers born before [birth year] who lack laboratory evidence of measles, mumps, or rubella immunity or laboratory confirmation of disease should consider measles and mumps immunization with 2 doses of MMR vaccine or rubella immunization with 1 dose of MMR vaccine.  Pneumococcal 13-valent conjugate (PCV13) vaccine. When indicated, a person who is uncertain of her immunization history and has no record of immunization should receive the PCV13 vaccine. An adult aged 47 years or older who has certain medical conditions and has not been previously immunized should receive 1 dose of PCV13 vaccine. This PCV13 should be followed with a dose of pneumococcal polysaccharide (PPSV23) vaccine. The PPSV23 vaccine dose should be obtained at least 8 weeks after the dose of PCV13 vaccine. An adult aged 60 years or older who has certain medical conditions and previously received 1 or more doses of PPSV23 vaccine should receive 1 dose of PCV13. The PCV13 vaccine dose should be obtained 1  or more years after the last PPSV23 vaccine dose.  Pneumococcal  polysaccharide (PPSV23) vaccine. When PCV13 is also indicated, PCV13 should be obtained first. All adults aged 27 years and older should be immunized. An adult younger than age 30 years who has certain medical conditions should be immunized. Any person who resides in a nursing home or long-term care facility should be immunized. An adult smoker should be immunized. People with an immunocompromised condition and certain other conditions should receive both PCV13 and PPSV23 vaccines. People with human immunodeficiency virus (HIV) infection should be immunized as soon as possible after diagnosis. Immunization during chemotherapy or radiation therapy should be avoided. Routine use of PPSV23 vaccine is not recommended for American Indians, [place] Natives, or people younger than 65 years unless there are medical conditions that require PPSV23 vaccine. When indicated, people who have unknown immunization and have no record of immunization should receive PPSV23 vaccine. One-time revaccination 5 years after the first dose of PPSV23 is recommended for people aged 2 64 years who have chronic kidney failure, nephrotic syndrome, asplenia, or immunocompromised conditions. People who received 1 2 doses of PPSV23 before age 61 years should receive another dose of PPSV23 vaccine at age 84 years or later if at least 5 years have passed since the previous dose. Doses of PPSV23 are not needed for people immunized with PPSV23 at or after age 15 years.  Meningococcal vaccine. Adults with asplenia or persistent complement component deficiencies should receive 2 doses of quadrivalent meningococcal conjugate (MenACWY-D) vaccine. The doses should be obtained at least 2 months apart. Microbiologists working with certain meningococcal bacteria, [employer] recruits, people at risk during an outbreak, and people who travel to or live in countries with a high rate of meningitis should be immunized. A first-year college student up through age 60  years who is living in a residence hall should receive a dose if she did not receive a dose on or after her 16th birthday. Adults who have certain high-risk conditions should receive one or more doses of vaccine.  Hepatitis A vaccine. Adults who wish to be protected from this disease, have certain high-risk conditions, work with hepatitis A-infected animals, work in hepatitis A research labs, or travel to or work in countries with a high rate of hepatitis A should be immunized. Adults who were previously unvaccinated and who anticipate close contact with an international adoptee during the first 60 days after arrival in the [place] from a country with a high rate of hepatitis A should be immunized.  Hepatitis B vaccine. Adults who wish to be protected from this disease, have certain high-risk conditions, may be exposed to blood or other infectious body fluids, are household contacts or sex partners of hepatitis B positive people, are clients or workers in certain care facilities, or travel to or work in countries with a high rate of hepatitis B should be immunized.  Haemophilus influenzae type b (Hib) vaccine. A previously unvaccinated person with asplenia or sickle cell disease or having a scheduled splenectomy should receive 1 dose of Hib vaccine. Regardless of previous immunization, a recipient of a hematopoietic stem cell transplant should receive a 3-dose series 6 12 months after her successful transplant. Hib vaccine is not recommended for adults with HIV infection. Preventive Services / Frequency Ages 55 to 39years  Blood pressure check.** / Every 1 to 2 years.  Lipid and cholesterol check.** / Every 5 years beginning at age 63.  Clinical breast exam.** / Every 3 years for women  in their 53s and 30s.  BRCA-related cancer risk assessment.** / For women who have family members with a BRCA-related cancer (breast, ovarian, tubal, or peritoneal cancers).  Pap test.** / Every 2 years from  ages 73 through 59. Every 3 years starting at age 3 through age 67 or 54 with a history of 3 consecutive normal Pap tests.  HPV screening.** / Every 3 years from ages 37 through ages 109 to 52 with a history of 3 consecutive normal Pap tests.  Hepatitis C blood test.** / For any individual with known risks for hepatitis C.  Skin self-exam. / Monthly.  Influenza vaccine. / Every year.  Tetanus, diphtheria, and acellular pertussis (Tdap, Td) vaccine.** / Consult your health care provider. Pregnant women should receive 1 dose of Tdap vaccine during each pregnancy. 1 dose of Td every 10 years.  Varicella vaccine.** / Consult your health care provider. Pregnant females who do not have evidence of immunity should receive the first dose after pregnancy.  HPV vaccine. / 3 doses over 6 months, if 42 and younger. The vaccine is not recommended for use in pregnant females. However, pregnancy testing is not needed before receiving a dose.  Measles, mumps, rubella (MMR) vaccine.** / You need at least 1 dose of MMR if you were born in 1957 or later. You may also need a 2nd dose. For females of childbearing age, rubella immunity should be determined. If there is no evidence of immunity, females who are not pregnant should be vaccinated. If there is no evidence of immunity, females who are pregnant should delay immunization until after pregnancy.  Pneumococcal 13-valent conjugate (PCV13) vaccine.** / Consult your health care provider.  Pneumococcal polysaccharide (PPSV23) vaccine.** / 1 to 2 doses if you smoke cigarettes or if you have certain conditions.  Meningococcal vaccine.** / 1 dose if you are age 38 to 58 years and a Market researcher living in a residence hall, or have one of several medical conditions, you need to get vaccinated against meningococcal disease. You may also need additional booster doses.  Hepatitis A vaccine.** / Consult your health care provider.  Hepatitis B vaccine.** /  Consult your health care provider.  Haemophilus influenzae type b (Hib) vaccine.** / Consult your health care provider. Ages 36 to 64years  Blood pressure check.** / Every 1 to 2 years.  Lipid and cholesterol check.** / Every 5 years beginning at age 32 years.  Lung cancer screening. / Every year if you are aged 13 80 years and have a 30-pack-year history of smoking and currently smoke or have quit within the past 15 years. Yearly screening is stopped once you have quit smoking for at least 15 years or develop a health problem that would prevent you from having lung cancer treatment.  Clinical breast exam.** / Every year after age 66 years.  BRCA-related cancer risk assessment.** / For women who have family members with a BRCA-related cancer (breast, ovarian, tubal, or peritoneal cancers).  Mammogram.** / Every year beginning at age 45 years and continuing for as long as you are in good health. Consult with your health care provider.  Pap test.** / Every 3 years starting at age 47 years through age 59 or 61 years with a history of 3 consecutive normal Pap tests.  HPV screening.** / Every 3 years from ages 4 years through ages 88 to 54 years with a history of 3 consecutive normal Pap tests.  Fecal occult blood test (FOBT) of stool. / Every year beginning  at age 32 years and continuing until age 39 years. You may not need to do this test if you get a colonoscopy every 10 years.  Flexible sigmoidoscopy or colonoscopy.** / Every 5 years for a flexible sigmoidoscopy or every 10 years for a colonoscopy beginning at age 78 years and continuing until age 57 years.  Hepatitis C blood test.** / For all people born from [birth year] through 1964 and any individual with known risks for hepatitis C.  Skin self-exam. / Monthly.  Influenza vaccine. / Every year.  Tetanus, diphtheria, and acellular pertussis (Tdap/Td) vaccine.** / Consult your health care provider. Pregnant women should receive 1 dose of  Tdap vaccine during each pregnancy. 1 dose of Td every 10 years.  Varicella vaccine.** / Consult your health care provider. Pregnant females who do not have evidence of immunity should receive the first dose after pregnancy.  Zoster vaccine.** / 1 dose for adults aged 53 years or older.  Measles, mumps, rubella (MMR) vaccine.** / You need at least 1 dose of MMR if you were born in 1957 or later. You may also need a 2nd dose. For females of childbearing age, rubella immunity should be determined. If there is no evidence of immunity, females who are not pregnant should be vaccinated. If there is no evidence of immunity, females who are pregnant should delay immunization until after pregnancy.  Pneumococcal 13-valent conjugate (PCV13) vaccine.** / Consult your health care provider.  Pneumococcal polysaccharide (PPSV23) vaccine.** / 1 to 2 doses if you smoke cigarettes or if you have certain conditions.  Meningococcal vaccine.** / Consult your health care provider.  Hepatitis A vaccine.** / Consult your health care provider.  Hepatitis B vaccine.** / Consult your health care provider.  Haemophilus influenzae type b (Hib) vaccine.** / Consult your health care provider. Ages 80 years and over  Blood pressure check.** / Every 1 to 2 years.  Lipid and cholesterol check.** / Every 5 years beginning at age 76 years.  Lung cancer screening. / Every year if you are aged 18 80 years and have a 30-pack-year history of smoking and currently smoke or have quit within the past 15 years. Yearly screening is stopped once you have quit smoking for at least 15 years or develop a health problem that would prevent you from having lung cancer treatment.  Clinical breast exam.** / Every year after age 31 years.  BRCA-related cancer risk assessment.** / For women who have family members with a BRCA-related cancer (breast, ovarian, tubal, or peritoneal cancers).  Mammogram.** / Every year beginning at age 74  years and continuing for as long as you are in good health. Consult with your health care provider.  Pap test.** / Every 3 years starting at age 61 years through age 2 or 23 years with 3 consecutive normal Pap tests. Testing can be stopped between 65 and 70 years with 3 consecutive normal Pap tests and no abnormal Pap or HPV tests in the past 10 years.  HPV screening.** / Every 3 years from ages 47 years through ages 85 or 78 years with a history of 3 consecutive normal Pap tests. Testing can be stopped between 65 and 70 years with 3 consecutive normal Pap tests and no abnormal Pap or HPV tests in the past 10 years.  Fecal occult blood test (FOBT) of stool. / Every year beginning at age 4 years and continuing until age 45 years. You may not need to do this test if you get a colonoscopy every 10  years.  Flexible sigmoidoscopy or colonoscopy.** / Every 5 years for a flexible sigmoidoscopy or every 10 years for a colonoscopy beginning at age 25 years and continuing until age 8 years.  Hepatitis C blood test.** / For all people born from [birth year] through 1964 and any individual with known risks for hepatitis C.  Osteoporosis screening.** / A one-time screening for women ages 31 years and over and women at risk for fractures or osteoporosis.  Skin self-exam. / Monthly.  Influenza vaccine. / Every year.  Tetanus, diphtheria, and acellular pertussis (Tdap/Td) vaccine.** / 1 dose of Td every 10 years.  Varicella vaccine.** / Consult your health care provider.  Zoster vaccine.** / 1 dose for adults aged 27 years or older.  Pneumococcal 13-valent conjugate (PCV13) vaccine.** / Consult your health care provider.  Pneumococcal polysaccharide (PPSV23) vaccine.** / 1 dose for all adults aged 18 years and older.  Meningococcal vaccine.** / Consult your health care provider.  Hepatitis A vaccine.** / Consult your health care provider.  Hepatitis B vaccine.** / Consult your health care  provider.  Haemophilus influenzae type b (Hib) vaccine.** / Consult your health care provider. ** Family history and personal history of risk and conditions may change your health care provider's recommendations. Document Released: 11/15/2001 Document Revised: 07/10/2013 Document Reviewed: 02/14/2011 [facility] Patient Information 2014 [place], [place].

## 2014-02-21 NOTE — Progress Notes (Signed)
    GYNECOLOGY CLINIC ANNUAL PREVENTATIVE CARE ENCOUNTER NOTE  Subjective:     Frances Hill is a 28 y.o. G0P0000 female with endometriosis here for a routine annual gynecologic exam.  Current complaints: had breakthrough bleeding due to pill nonadherence.  OCPs given her GI symptoms such as nausea and diarrhea. She is investigating use of natural products for management of her pain and symptoms.  Gynecologic History Patient's last menstrual period was 01/23/2014. Contraception: abstinence. Not dating anyone now. Last Pap: over a year ago. Results were: normal  The following portions of the patient's history were reviewed and updated as appropriate: allergies, current medications, past family history, past medical history, past social history, past surgical history and problem list.  Review of Systems Pertinent items are noted in HPI.    Objective:   BP 124/81  Pulse 71  Temp(Src) 98.7 F (37.1 C) (Oral)  Ht 5\' 5"  (1.651 m)  Wt 250 lb 1.6 oz (113.445 kg)  BMI 41.62 kg/m2  LMP 01/23/2014 GENERAL: Well-developed, well-nourished female in no acute distress.  HEENT: Normocephalic, atraumatic. Sclerae anicteric.  NECK: Supple. Normal thyroid.  LUNGS: Clear to auscultation bilaterally.  HEART: Regular rate and rhythm. BREASTS: Symmetric in size. No masses, skin changes, nipple drainage, or lymphadenopathy. ABDOMEN: Soft, nontender, nondistended. No organomegaly. PELVIC: Normal external female genitalia. Vagina is pink and rugated.  Normal discharge. Normal cervix contour. Pap smear obtained. Uterus is normal in size. No adnexal mass or tenderness.  EXTREMITIES: No cyanosis, clubbing, or edema, 2+ distal pulses.   Assessment:   Annual gynecologic examination Endometriosis   Plan:   Pap done, will follow up results and manage accordingly. She was told to be careful of natural products; unsure of what they contain and side effects.  Discussed Mirena IUD and other modalities;  she wants to stick to natural products for now. Will continue to monitor. Routine preventative health maintenance measures emphasized   Verita Schneiders, MD, [place] [facility]ending [facility], [place]

## 2014-02-21 NOTE — Addendum Note (Signed)
Addended by: Novella Olive on: 02/21/2014 12:05 PM   Modules accepted: Orders

## 2014-02-22 LAB — WET PREP, GENITAL
Trich, Wet Prep: NONE SEEN
Yeast Wet Prep HPF POC: NONE SEEN

## 2014-02-22 LAB — HEPATITIS C ANTIBODY: HCV Ab: NEGATIVE

## 2014-02-22 LAB — RPR

## 2014-02-22 LAB — HIV ANTIBODY (ROUTINE TESTING W REFLEX): HIV 1&2 Ab, 4th Generation: NONREACTIVE

## 2014-02-22 MED ORDER — METRONIDAZOLE 500 MG PO TABS: 500.0000 mg | ORAL_TABLET | Freq: Two times a day (BID) | ORAL | Status: DC

## 2014-02-22 NOTE — Addendum Note (Signed)
Addended by: Verita Schneiders A on: 02/22/2014 05:11 PM   Modules accepted: Orders

## 2014-02-25 ENCOUNTER — Telehealth: Payer: Self-pay

## 2014-02-25 NOTE — Telephone Encounter (Signed)
Message copied by Geanie Logan on Tue Feb 25, 2014  8:21 AM ------      Message from: Osborne Oman      Created: Sat Feb 22, 2014  5:12 PM       Wet prep showed clue cells consistent with BV, Metronidazole e-prescribed. Please call to inform patient of results and advise her to pick up prescription.        ------

## 2014-02-25 NOTE — Telephone Encounter (Signed)
Patient informed of results and medication at pharmacy; informed patient of all other STD results as well; pap not back at this time. Patient verbalized understanding and gratitude. No further questions or concerns.

## 2014-05-14 ENCOUNTER — Encounter: Payer: Self-pay | Admitting: Medical

## 2014-05-14 ENCOUNTER — Ambulatory Visit (INDEPENDENT_AMBULATORY_CARE_PROVIDER_SITE_OTHER): Payer: 59 | Admitting: Medical

## 2014-05-14 VITALS — BP 120/80 | HR 60 | Temp 98.7°F | Resp 16 | Wt 249.0 lb

## 2014-05-14 DIAGNOSIS — J069 Acute upper respiratory infection, unspecified: Secondary | ICD-10-CM

## 2014-05-14 NOTE — Progress Notes (Signed)
Subjective:  Frances Hill is a 28 y.o. female who presents for illness.  Symptoms include 2 day hx/o head congestion, sinus pressure, ears popping, sore throat, mild headache, chills, body aches.   Denies shortness of breath, wheezing, rash, fever.  Treatment to date: Robitussin and DayQuil.  + Cold sick contact.  No other aggravating or relieving factors.  No other c/o.  ROS as in subjective.   Objective: Filed Vitals:   05/14/14 1533  BP: 120/80  Pulse: 60  Temp: 98.7 F (37.1 C)  Resp: 16    General appearance: Alert, WD/WN, no distress, mildly ill appearing, congested sounding                             Skin: warm, no rash                           Head: mild frontal sinus tenderness                            Eyes: conjunctiva normal, corneas clear, PERRLA                            Ears: pearly TMs, external ear canals normal                          Nose: septum midline, turbinates swollen, with erythema and clear discharge             Mouth/throat: MMM, tongue normal, mild pharyngeal erythema                           Neck: supple, no adenopathy, no thyromegaly, nontender                          Heart: RRR, normal S1, S2, no murmurs                         Lungs: CTA bilaterally, no wheezes, rales, or rhonchi     Assessment: Encounter Diagnosis  Name Primary?  . Viral upper respiratory illness Yes    Plan: Discussed diagnosis and treatment of URI.  Suggested symptomatic OTC remedies.  Nasal saline spray for congestion.  Tylenol or Ibuprofen OTC for fever and malaise.  Call/return in 2-3 days if much worse.  Advised usual course of illness.

## 2014-05-14 NOTE — Patient Instructions (Signed)
  Thank you for giving me the opportunity to serve you today.    Your diagnosis today includes: Encounter Diagnosis  Name Primary?  . Viral upper respiratory illness Yes     Specific recommendations today include:  You may use nasal saline/neti pot to flush the sinuses  Rest  Hydrate well with water  You can use Ibuprofen OTC for fever, aches, chills, not feeling well  You can use Dayquil and Nyquil  You can consider Zinc/Zycam to help with symptoms  If worse, high fever, shortness of breath, then let me know  Expect to not feel all that much better for a few more days.

## 2014-07-11 ENCOUNTER — Other Ambulatory Visit: Payer: Self-pay | Admitting: Obstetrics & Gynecology

## 2014-07-11 ENCOUNTER — Telehealth: Payer: Self-pay | Admitting: *Deleted

## 2014-07-11 NOTE — Telephone Encounter (Signed)
Frances Hill left a message stating she is a patient of Dr. Harolyn Rutherford and wanted to get a refill of flagyl for BV because she is having the same creamy discharge.  Called [facility] and notified her since it has been within 6 months I can not refill her flagyl, will need to see a provider. Will have registrar call her Monday to make appointment. [name] voices understanding.

## 2014-07-14 ENCOUNTER — Encounter: Payer: Self-pay | Admitting: Medical

## 2014-07-21 ENCOUNTER — Inpatient Hospital Stay (HOSPITAL_COMMUNITY)
Admission: AD | Admit: 2014-07-21 | Discharge: 2014-07-21 | Disposition: A | Discharge status: Home / Self Care | Payer: 59 | Source: Ambulatory Visit | Attending: Family Medicine | Admitting: Family Medicine

## 2014-07-21 ENCOUNTER — Inpatient Hospital Stay (HOSPITAL_COMMUNITY): Payer: 59

## 2014-07-21 ENCOUNTER — Encounter (HOSPITAL_COMMUNITY): Payer: Self-pay | Admitting: *Deleted

## 2014-07-21 DIAGNOSIS — R509 Fever, unspecified: Secondary | ICD-10-CM | POA: Diagnosis not present

## 2014-07-21 DIAGNOSIS — R109 Unspecified abdominal pain: Secondary | ICD-10-CM | POA: Diagnosis present

## 2014-07-21 DIAGNOSIS — N12 Tubulo-interstitial nephritis, not specified as acute or chronic: Secondary | ICD-10-CM

## 2014-07-21 LAB — CBC WITH DIFFERENTIAL/PLATELET
Basophils Absolute: 0 10*3/uL (ref 0.0–0.1)
Basophils Relative: 0 % (ref 0–1)
Eosinophils Absolute: 0 10*3/uL (ref 0.0–0.7)
Eosinophils Relative: 1 % (ref 0–5)
HCT: 37.4 % (ref 36.0–46.0)
Hemoglobin: 12.9 g/dL (ref 12.0–15.0)
Lymphocytes Relative: 6 % — ABNORMAL LOW (ref 12–46)
Lymphs Abs: 0.5 10*3/uL — ABNORMAL LOW (ref 0.7–4.0)
MCH: 25.6 pg — ABNORMAL LOW (ref 26.0–34.0)
MCHC: 34.5 g/dL (ref 30.0–36.0)
MCV: 74.2 fL — ABNORMAL LOW (ref 78.0–100.0)
Monocytes Absolute: 0.8 10*3/uL (ref 0.1–1.0)
Monocytes Relative: 9 % (ref 3–12)
Neutro Abs: 7.4 10*3/uL (ref 1.7–7.7)
Neutrophils Relative %: 85 % — ABNORMAL HIGH (ref 43–77)
Platelets: 298 10*3/uL (ref 150–400)
RBC: 5.04 MIL/uL (ref 3.87–5.11)
RDW: 14.6 % (ref 11.5–15.5)
WBC: 8.8 10*3/uL (ref 4.0–10.5)

## 2014-07-21 LAB — URINALYSIS, ROUTINE W REFLEX MICROSCOPIC
Bilirubin Urine: NEGATIVE
Glucose, UA: NEGATIVE mg/dL
Ketones, ur: NEGATIVE mg/dL
Leukocytes, UA: NEGATIVE
Nitrite: POSITIVE — AB
Protein, ur: NEGATIVE mg/dL
Specific Gravity, Urine: 1.015 (ref 1.005–1.030)
Urobilinogen, UA: 0.2 mg/dL (ref 0.0–1.0)
pH: 6 (ref 5.0–8.0)

## 2014-07-21 LAB — COMPREHENSIVE METABOLIC PANEL
ALT: 18 U/L (ref 0–35)
AST: 20 U/L (ref 0–37)
Albumin: 4.1 g/dL (ref 3.5–5.2)
Alkaline Phosphatase: 65 U/L (ref 39–117)
Anion gap: 13 (ref 5–15)
BUN: 11 mg/dL (ref 6–23)
CO2: 24 mEq/L (ref 19–32)
Calcium: 9.8 mg/dL (ref 8.4–10.5)
Chloride: 98 mEq/L (ref 96–112)
Creatinine, Ser: 0.97 mg/dL (ref 0.50–1.10)
GFR calc Af Amer: >90 mL/min (ref >90)
GFR calc non Af Amer: 79 mL/min — ABNORMAL LOW (ref >90)
Glucose, Bld: 97 mg/dL (ref 70–99)
Potassium: 4 mEq/L (ref 3.7–5.3)
Sodium: 135 mEq/L — ABNORMAL LOW (ref 137–147)
Total Bilirubin: 0.5 mg/dL (ref 0.3–1.2)
Total Protein: 8 g/dL (ref 6.0–8.3)

## 2014-07-21 LAB — WET PREP, GENITAL
Clue Cells Wet Prep HPF POC: NONE SEEN
Trich, Wet Prep: NONE SEEN
WBC, Wet Prep HPF POC: NONE SEEN
Yeast Wet Prep HPF POC: NONE SEEN

## 2014-07-21 LAB — GLUCOSE, CAPILLARY: Glucose-Capillary: 94 mg/dL (ref 70–99)

## 2014-07-21 LAB — URINE MICROSCOPIC-ADD ON

## 2014-07-21 LAB — POCT PREGNANCY, URINE: Preg Test, Ur: NEGATIVE

## 2014-07-21 MED ORDER — HYDROMORPHONE HCL 1 MG/ML IJ SOLN
1.0000 mg | Freq: Once | INTRAMUSCULAR | Status: AC
Start: 2014-07-21 — End: 2014-07-21
  Administered 2014-07-21: 1 mg via INTRAVENOUS
  Filled 2014-07-21: qty 1

## 2014-07-21 MED ORDER — CIPROFLOXACIN HCL 500 MG PO TABS: 500.0000 mg | ORAL_TABLET | Freq: Two times a day (BID) | ORAL | Status: DC

## 2014-07-21 MED ORDER — KETOROLAC TROMETHAMINE 30 MG/ML IJ SOLN
30.0000 mg | Freq: Once | INTRAMUSCULAR | Status: AC
  Administered 2014-07-21: 30 mg via INTRAVENOUS
  Filled 2014-07-21: qty 1

## 2014-07-21 MED ORDER — HYDROCODONE-ACETAMINOPHEN 5-325 MG PO TABS: 1.0000 | ORAL_TABLET | ORAL | Status: DC | PRN

## 2014-07-21 NOTE — Discharge Instructions (Signed)
Pyelonephritis, Adult °Pyelonephritis is a kidney infection. In general, there are 2 main types of pyelonephritis: °· Infections that come on quickly without any warning (acute pyelonephritis). °· Infections that persist for a long period of time (chronic pyelonephritis). °CAUSES  °Two main causes of pyelonephritis are: °· Bacteria traveling from the bladder to the kidney. This is a problem especially in pregnant women. The urine in the bladder can become filled with bacteria from multiple causes, including: °¨ Inflammation of the prostate gland (prostatitis). °¨ Sexual intercourse in females. °¨ Bladder infection (cystitis). °· Bacteria traveling from the bloodstream to the tissue part of the kidney. °Problems that may increase your risk of getting a kidney infection include: °· Diabetes. °· Kidney stones or bladder stones. °· Cancer. °· Catheters placed in the bladder. °· Other abnormalities of the kidney or ureter. °SYMPTOMS  °· Abdominal pain. °· Pain in the side or flank area. °· Fever. °· Chills. °· Upset stomach. °· Blood in the urine (dark urine). °· Frequent urination. °· Strong or persistent urge to urinate. °· Burning or stinging when urinating. °DIAGNOSIS  °Your caregiver may diagnose your kidney infection based on your symptoms. A urine sample may also be taken. °TREATMENT  °In general, treatment depends on how severe the infection is.  °· If the infection is mild and caught early, your caregiver may treat you with oral antibiotics and send you home. °· If the infection is more severe, the bacteria may have gotten into the bloodstream. This will require intravenous (IV) antibiotics and a hospital stay. Symptoms may include: °¨ High fever. °¨ Severe flank pain. °¨ Shaking chills. °· Even after a hospital stay, your caregiver may require you to be on oral antibiotics for a period of time. °· Other treatments may be required depending upon the cause of the infection. °HOME CARE INSTRUCTIONS  °· Take your  antibiotics as directed. Finish them even if you start to feel better. °· Make an appointment to have your urine checked to make sure the infection is gone. °· Drink enough fluids to keep your urine clear or pale yellow. °· Take medicines for the bladder if you have urgency and frequency of urination as directed by your caregiver. °SEEK IMMEDIATE MEDICAL CARE IF:  °· You have a fever or persistent symptoms for more than 2-3 days. °· You have a fever and your symptoms suddenly get worse. °· You are unable to take your antibiotics or fluids. °· You develop shaking chills. °· You experience extreme weakness or fainting. °· There is no improvement after 2 days of treatment. °MAKE SURE YOU: °· Understand these instructions. °· Will watch your condition. °· Will get help right away if you are not doing well or get worse. °Document Released: 09/19/2005 Document Revised: 03/20/2012 Document Reviewed: 02/23/2011 °[employer]® Patient Information ©2015 [employer]. This information is not intended to replace advice given to you by your health care provider. Make sure you discuss any questions you have with your health care provider. ° °

## 2014-07-21 NOTE — MAU Note (Signed)
Patient states she has a history of endometriosis. States she has abdominal pain off and on in the right abdomen but has been worse today. Feels extreme fatigue and "upset stomach" like gas. Denies vomiting or diarrhea, no bleeding but has a creamy vaginal discharge.

## 2014-07-21 NOTE — MAU Provider Note (Signed)
Attestation of Attending Supervision of Advanced Practitioner (PA/CNM/NP): Evaluation and management procedures were performed by the Advanced Practitioner under my supervision and collaboration.  I have reviewed the Advanced Practitioner's note and chart, and I agree with the management and plan.  Alexys Gassett, DO Attending [facility], [facility]   

## 2014-07-21 NOTE — MAU Provider Note (Signed)
History     CSN: [id number]  Arrival date and time: 07/21/14 1819   None     Chief Complaint  Patient presents with  . Abdominal Pain  . Fatigue  . Headache   HPI  [name] is a 28 y.o. female [place] who presents with abdominal pain with fatigue. She started feeling sharp pains in her right side of her abdomen off and on for 1 month. She thought it was an ovarian cyst because she has a history of that. She feels very bloated, with sharp sensation pain in her RLQ. +nausea, + fever.  Last ate around 11:00 Green tea at 11:00 Patient rates her pain 8/10; RLQ  OB History   Grav Para Term Preterm Abortions TAB SAB Ect Mult Living   _0      Past Medical History  Diagnosis Date  . Sexually transmitted disease (STD)     hx/o trichomonas  . Anxiety   . UTI (urinary tract infection) 2007    hospitalization  . Hyperhydrosis disorder     axilla  . Bacterial vaginosis     history of  . Obesity   . Sickle cell trait   . Endometriosis     Past Surgical History  Procedure Laterality Date  . Laparoscopy N/A 12/25/2013    Procedure: OPERATIVE LAPAROSCOPY;  Surgeon: Osborne Oman, MD;  Location: [facility];  Service: Gynecology;  Laterality: N/A;  . Salpingoophorectomy Left 12/25/2013    Procedure: SALPINGO OOPHORECTOMY;  Surgeon: Osborne Oman, MD;  Location: [facility];  Service: Gynecology;  Laterality: Left;    Family History  Problem Relation Age of Onset  . Aneurysm Mother 75    died of aneurysm  . Cancer Mother 43    breast, twice  . Hypertension Father   . Depression Father   . Heart disease Father     valve disease  . Cancer Maternal Aunt     breast cancer  . Diabetes Neg Hx   . Stroke Neg Hx     History  Substance Use Topics  . Smoking status: Never Smoker   . Smokeless tobacco: Not on file  . Alcohol Use: 0.6 oz/week    1 Cans of beer per week    Allergies: No Known Allergies  Prescriptions prior to admission  Medication  Sig Dispense Refill  . metroNIDAZOLE (FLAGYL) 500 MG tablet TAKE 1 TABLET BY MOUTH TWICE DAILY  14 tablet  0  . OVER THE COUNTER MEDICATION Natural OTC medication       Results for orders placed during the hospital encounter of 07/21/14 (from the past 48 hour(s))  URINALYSIS, ROUTINE W REFLEX MICROSCOPIC     Status: Abnormal   Collection Time    07/21/14  6:55 PM      Result Value Ref Range   Color, Urine YELLOW  YELLOW   APPearance CLEAR  CLEAR   Specific Gravity, Urine 1.015  1.005 - 1.030   pH 6.0  5.0 - 8.0   Glucose, UA NEGATIVE  NEGATIVE mg/dL   Hgb urine dipstick TRACE (*) NEGATIVE   Bilirubin Urine NEGATIVE  NEGATIVE   Ketones, ur NEGATIVE  NEGATIVE mg/dL   Protein, ur NEGATIVE  NEGATIVE mg/dL   Urobilinogen, UA 0.2  0.0 - 1.0 mg/dL   Nitrite POSITIVE (*) NEGATIVE   Leukocytes, UA NEGATIVE  NEGATIVE  URINE MICROSCOPIC-ADD ON     Status: Abnormal   Collection Time  07/21/14  6:55 PM      Result Value Ref Range   Squamous Epithelial / LPF FEW (*) RARE   WBC, UA 0-2  <3 WBC/hpf   RBC / HPF 3-6  <3 RBC/hpf   Bacteria, UA MANY (*) RARE  CBC WITH DIFFERENTIAL     Status: Abnormal   Collection Time    07/21/14  7:00 PM      Result Value Ref Range   WBC 8.8  4.0 - 10.5 K/uL   RBC 5.04  3.87 - 5.11 MIL/uL   Hemoglobin 12.9  12.0 - 15.0 g/dL   HCT 90.1  69.8 - 29.6 %   MCV 74.2 (*) 78.0 - 100.0 fL   MCH 25.6 (*) 26.0 - 34.0 pg   MCHC 34.5  30.0 - 36.0 g/dL   RDW 70.0  04.7 - 67.3 %   Platelets 298  150 - 400 K/uL   Neutrophils Relative % 85 (*) 43 - 77 %   Neutro Abs 7.4  1.7 - 7.7 K/uL   Lymphocytes Relative 6 (*) 12 - 46 %   Lymphs Abs 0.5 (*) 0.7 - 4.0 K/uL   Monocytes Relative 9  3 - 12 %   Monocytes Absolute 0.8  0.1 - 1.0 K/uL   Eosinophils Relative 1  0 - 5 %   Eosinophils Absolute 0.0  0.0 - 0.7 K/uL   Basophils Relative 0  0 - 1 %   Basophils Absolute 0.0  0.0 - 0.1 K/uL  COMPREHENSIVE METABOLIC PANEL     Status: Abnormal   Collection Time    07/21/14   7:00 PM      Result Value Ref Range   Sodium 135 (*) 137 - 147 mEq/L   Potassium 4.0  3.7 - 5.3 mEq/L   Chloride 98  96 - 112 mEq/L   CO2 24  19 - 32 mEq/L   Glucose, Bld 97  70 - 99 mg/dL   BUN 11  6 - 23 mg/dL   Creatinine, Ser 7.84  0.50 - 1.10 mg/dL   Calcium 9.8  8.4 - 53.0 mg/dL   Total Protein 8.0  6.0 - 8.3 g/dL   Albumin 4.1  3.5 - 5.2 g/dL   AST 20  0 - 37 U/L   ALT 18  0 - 35 U/L   Alkaline Phosphatase 65  39 - 117 U/L   Total Bilirubin 0.5  0.3 - 1.2 mg/dL   GFR calc non Af Amer 79 (*) >90 mL/min   GFR calc Af Amer >90  >90 mL/min   Comment: (NOTE)     The eGFR has been calculated using the CKD EPI equation.     This calculation has not been validated in all clinical situations.     eGFR's persistently <90 mL/min signify possible Chronic Kidney     Disease.   Anion gap 13  5 - 15  POCT PREGNANCY, URINE     Status: None   Collection Time    07/21/14  7:08 PM      Result Value Ref Range   Preg Test, Ur NEGATIVE  NEGATIVE   Comment:            THE SENSITIVITY OF THIS     METHODOLOGY IS >24 mIU/mL  GLUCOSE, CAPILLARY     Status: None   Collection Time    07/21/14  7:37 PM      Result Value Ref Range   Glucose-Capillary 94  70 - 99  mg/dL     Review of Systems  Constitutional: Positive for fever and chills.  Gastrointestinal: Positive for nausea and abdominal pain. Negative for diarrhea and constipation.  Genitourinary: Negative for dysuria, urgency, frequency and hematuria.       Sweet smell to her urine.  +creamy white discharge with fishy odor.   Neurological: Positive for dizziness.   Physical Exam   Blood pressure 142/89, pulse 94, temperature 99.7 F (37.6 C), temperature source Oral, resp. rate 18, height _0  (1.702 m), weight 113.036 kg (249 lb 3.2 oz), last menstrual period 07/13/2014, SpO2 100.00%.  Physical Exam  Constitutional: She is oriented to person, place, and time. She appears well-developed and well-nourished. No distress.  HENT:   Head: Normocephalic.  Eyes: Pupils are equal, round, and reactive to light.  Neck: Neck supple.  Respiratory: Effort normal.  GI: Soft. Normal appearance. There is tenderness in the right lower quadrant and suprapubic area. There is rigidity and guarding. There is no rebound.  Genitourinary:  Speculum exam: Vagina - Small amount of creamy discharge, no odor Cervix - No contact bleeding Bimanual exam: Cervix closed, no CMT  Uterus non tender, normal size Adnexa non tender, no masses bilaterally GC/Chlam, wet prep done Chaperone present for exam.   Musculoskeletal: Normal range of motion.  Neurological: She is alert and oriented to person, place, and time.  Skin: Skin is warm. She is not diaphoretic.  Psychiatric: Her behavior is normal.    MAU Course  Procedures None  MDM Dilaudid 1 mg given IV LR  Normal WBC on CBC + UTI on UA Urine culture pending Wet prep GC Pelvic US  CBC with diff CMP  Results for orders placed during the hospital encounter of 07/21/14 (from the past 24 hour(s))  URINALYSIS, ROUTINE W REFLEX MICROSCOPIC     Status: Abnormal   Collection Time    07/21/14  6:55 PM      Result Value Ref Range   Color, Urine YELLOW  YELLOW   APPearance CLEAR  CLEAR   Specific Gravity, Urine 1.015  1.005 - 1.030   pH 6.0  5.0 - 8.0   Glucose, UA NEGATIVE  NEGATIVE mg/dL   Hgb urine dipstick TRACE (*) NEGATIVE   Bilirubin Urine NEGATIVE  NEGATIVE   Ketones, ur NEGATIVE  NEGATIVE mg/dL   Protein, ur NEGATIVE  NEGATIVE mg/dL   Urobilinogen, UA 0.2  0.0 - 1.0 mg/dL   Nitrite POSITIVE (*) NEGATIVE   Leukocytes, UA NEGATIVE  NEGATIVE  URINE MICROSCOPIC-ADD ON     Status: Abnormal   Collection Time    07/21/14  6:55 PM      Result Value Ref Range   Squamous Epithelial / LPF FEW (*) RARE   WBC, UA 0-2  <3 WBC/hpf   RBC / HPF 3-6  <3 RBC/hpf   Bacteria, UA MANY (*) RARE  CBC WITH DIFFERENTIAL     Status: Abnormal   Collection Time    07/21/14  7:00 PM       Result Value Ref Range   WBC 8.8  4.0 - 10.5 K/uL   RBC 5.04  3.87 - 5.11 MIL/uL   Hemoglobin 12.9  12.0 - 15.0 g/dL   HCT 37.4  36.0 - 46.0 %   MCV 74.2 (*) 78.0 - 100.0 fL   MCH 25.6 (*) 26.0 - 34.0 pg   MCHC 34.5  30.0 - 36.0 g/dL   RDW 14.6  11.5 - 15.5 %   Platelets 298  150 -  400 K/uL   Neutrophils Relative % 85 (*) 43 - 77 %   Neutro Abs 7.4  1.7 - 7.7 K/uL   Lymphocytes Relative 6 (*) 12 - 46 %   Lymphs Abs 0.5 (*) 0.7 - 4.0 K/uL   Monocytes Relative 9  3 - 12 %   Monocytes Absolute 0.8  0.1 - 1.0 K/uL   Eosinophils Relative 1  0 - 5 %   Eosinophils Absolute 0.0  0.0 - 0.7 K/uL   Basophils Relative 0  0 - 1 %   Basophils Absolute 0.0  0.0 - 0.1 K/uL  COMPREHENSIVE METABOLIC PANEL     Status: Abnormal   Collection Time    07/21/14  7:00 PM      Result Value Ref Range   Sodium 135 (*) 137 - 147 mEq/L   Potassium 4.0  3.7 - 5.3 mEq/L   Chloride 98  96 - 112 mEq/L   CO2 24  19 - 32 mEq/L   Glucose, Bld 97  70 - 99 mg/dL   BUN 11  6 - 23 mg/dL   Creatinine, Ser 0.97  0.50 - 1.10 mg/dL   Calcium 9.8  8.4 - 10.5 mg/dL   Total Protein 8.0  6.0 - 8.3 g/dL   Albumin 4.1  3.5 - 5.2 g/dL   AST 20  0 - 37 U/L   ALT 18  0 - 35 U/L   Alkaline Phosphatase 65  39 - 117 U/L   Total Bilirubin 0.5  0.3 - 1.2 mg/dL   GFR calc non Af Amer 79 (*) >90 mL/min   GFR calc Af Amer >90  >90 mL/min   Anion gap 13  5 - 15  POCT PREGNANCY, URINE     Status: None   Collection Time    07/21/14  7:08 PM      Result Value Ref Range   Preg Test, Ur NEGATIVE  NEGATIVE  GLUCOSE, CAPILLARY     Status: None   Collection Time    07/21/14  7:37 PM      Result Value Ref Range   Glucose-Capillary 94  70 - 99 mg/dL  WET PREP, GENITAL     Status: None   Collection Time    07/21/14  7:40 PM      Result Value Ref Range   Yeast Wet Prep HPF POC NONE SEEN  NONE SEEN   Trich, Wet Prep NONE SEEN  NONE SEEN   Clue Cells Wet Prep HPF POC NONE SEEN  NONE SEEN   WBC, Wet Prep HPF POC NONE SEEN  NONE SEEN    US Transvaginal Non-ob  07/21/2014   CLINICAL DATA:  28 year old female with acute right side abdominal pain and fever. Initial encounter. Personal history of endometriosis with left oophorectomy in March 2015. LMP 07/13/2014.  EXAM: TRANSABDOMINAL AND TRANSVAGINAL ULTRASOUND OF PELVIS  DOPPLER ULTRASOUND OF OVARIES  TECHNIQUE: Both transabdominal and transvaginal ultrasound examinations of the pelvis were performed. Transabdominal technique was performed for global imaging of the pelvis including uterus, ovaries, adnexal regions, and pelvic cul-de-sac.  It was necessary to proceed with endovaginal exam following the transabdominal exam to visualize the endometrium and right ovary. Color and duplex Doppler ultrasound was utilized to evaluate blood flow to the ovaries.  COMPARISON:  Pelvis ultrasound and CT Abdomen and Pelvis 12/05/2013.  FINDINGS: Uterus  Measurements: 8.0 x 4.8 x 5.2 cm. Multiple small fibroids up to 2.5 cm diameter each, not significantly changed.  Endometrium  Thickness:  12 mm.  No focal abnormality visualized.  Right ovary  Measurements: 6.3 x 3.7 x 5.3 cm. Simple cysts/follicles as well as multiple small complex areas noted. The complex lesions demonstrate homogeneous low level internal echoes and measure up to 18 mm diameter each (image 83). Similar appearance to the prior study.  Left ovary  Measurements: Surgically absent. Round mass encompassing 5.0 x 2.6 x 2.2 cm with homogeneous low level internal echoes. This lesion (image 103 today) appears stable to that on the March study depicted on image 98 at that time.  Pulsed Doppler evaluation of both ovaries demonstrates normal low resistance arterial and venous waveforms in the right ovary.  Other findings  No free fluid.  IMPRESSION: 1. Persistent 5 cm complex mass in the left adnexal region with homogeneous low-level internal echoes favoring endometrioma. 2. No evidence of right ovarian torsion. Suggestion of multiple small (up to 18  mm) chronic endometriomas associated with the right ovary. 3. Several uterine fibroids up to 2.5 cm each have not significantly changed.   Electronically Signed   By: Lars Pinks M.D.   On: 07/21/2014 21:17   US Pelvis Complete  07/21/2014   CLINICAL DATA:  28 year old female with acute right side abdominal pain and fever. Initial encounter. Personal history of endometriosis with left oophorectomy in March 2015. LMP 07/13/2014.  EXAM: TRANSABDOMINAL AND TRANSVAGINAL ULTRASOUND OF PELVIS  DOPPLER ULTRASOUND OF OVARIES  TECHNIQUE: Both transabdominal and transvaginal ultrasound examinations of the pelvis were performed. Transabdominal technique was performed for global imaging of the pelvis including uterus, ovaries, adnexal regions, and pelvic cul-de-sac.  It was necessary to proceed with endovaginal exam following the transabdominal exam to visualize the endometrium and right ovary. Color and duplex Doppler ultrasound was utilized to evaluate blood flow to the ovaries.  COMPARISON:  Pelvis ultrasound and CT Abdomen and Pelvis 12/05/2013.  FINDINGS: Uterus  Measurements: 8.0 x 4.8 x 5.2 cm. Multiple small fibroids up to 2.5 cm diameter each, not significantly changed.  Endometrium  Thickness: 12 mm.  No focal abnormality visualized.  Right ovary  Measurements: 6.3 x 3.7 x 5.3 cm. Simple cysts/follicles as well as multiple small complex areas noted. The complex lesions demonstrate homogeneous low level internal echoes and measure up to 18 mm diameter each (image 83). Similar appearance to the prior study.  Left ovary  Measurements: Surgically absent. Round mass encompassing 5.0 x 2.6 x 2.2 cm with homogeneous low level internal echoes. This lesion (image 103 today) appears stable to that on the March study depicted on image 98 at that time.  Pulsed Doppler evaluation of both ovaries demonstrates normal low resistance arterial and venous waveforms in the right ovary.  Other findings  No free fluid.  IMPRESSION: 1.  Persistent 5 cm complex mass in the left adnexal region with homogeneous low-level internal echoes favoring endometrioma. 2. No evidence of right ovarian torsion. Suggestion of multiple small (up to 18 mm) chronic endometriomas associated with the right ovary. 3. Several uterine fibroids up to 2.5 cm each have not significantly changed.   Electronically Signed   By: Lars Pinks M.D.   On: 07/21/2014 21:17   [place] Art/ven Flow Abd Pelv Doppler  07/21/2014   CLINICAL DATA:  28 year old female with acute right side abdominal pain and fever. Initial encounter. Personal history of endometriosis with left oophorectomy in March 2015. LMP 07/13/2014.  EXAM: TRANSABDOMINAL AND TRANSVAGINAL ULTRASOUND OF PELVIS  DOPPLER ULTRASOUND OF OVARIES  TECHNIQUE: Both transabdominal and transvaginal ultrasound examinations of the  pelvis were performed. Transabdominal technique was performed for global imaging of the pelvis including uterus, ovaries, adnexal regions, and pelvic cul-de-sac.  It was necessary to proceed with endovaginal exam following the transabdominal exam to visualize the endometrium and right ovary. Color and duplex Doppler ultrasound was utilized to evaluate blood flow to the ovaries.  COMPARISON:  Pelvis ultrasound and CT Abdomen and Pelvis 12/05/2013.  FINDINGS: Uterus  Measurements: 8.0 x 4.8 x 5.2 cm. Multiple small fibroids up to 2.5 cm diameter each, not significantly changed.  Endometrium  Thickness: 12 mm.  No focal abnormality visualized.  Right ovary  Measurements: 6.3 x 3.7 x 5.3 cm. Simple cysts/follicles as well as multiple small complex areas noted. The complex lesions demonstrate homogeneous low level internal echoes and measure up to 18 mm diameter each (image 83). Similar appearance to the prior study.  Left ovary  Measurements: Surgically absent. Round mass encompassing 5.0 x 2.6 x 2.2 cm with homogeneous low level internal echoes. This lesion (image 103 today) appears stable to that on the March  study depicted on image 98 at that time.  Pulsed Doppler evaluation of both ovaries demonstrates normal low resistance arterial and venous waveforms in the right ovary.  Other findings  No free fluid.  IMPRESSION: 1. Persistent 5 cm complex mass in the left adnexal region with homogeneous low-level internal echoes favoring endometrioma. 2. No evidence of right ovarian torsion. Suggestion of multiple small (up to 18 mm) chronic endometriomas associated with the right ovary. 3. Several uterine fibroids up to 2.5 cm each have not significantly changed.   Electronically Signed   By: Lars Pinks M.D.   On: 07/21/2014 21:17     Report given to H. Norman Herrlich CNM who resumes care of the patient at 2010; patient awaiting pelvic US  Assessment and Plan   1. Fever in adult   2. Abdominal pain in female   3. Pyelonephritis    If no clinical improvement with antibiotics in 48 hours FU with [facility] Return to MAU as needed    Medication List    STOP taking these medications       metroNIDAZOLE 500 MG tablet  Commonly known as:  FLAGYL      TAKE these medications       ciprofloxacin 500 MG tablet  Commonly known as:  CIPRO  Take 1 tablet (500 mg total) by mouth 2 (two) times daily.     HYDROcodone-acetaminophen 5-325 MG per tablet  Commonly known as:  NORCO/VICODIN  Take 1-2 tablets by mouth every 4 (four) hours as needed for moderate pain.     naproxen sodium 220 MG tablet  Commonly known as:  ANAPROX  Take 440 mg by mouth every 30 (thirty) days. Patient states that she takes Aleve monthly, 1 day before her menstrual cycle.     OVER THE COUNTER MEDICATION  Take 1 capsule by mouth daily. Natural OTC medication; probiotic     OVER THE COUNTER MEDICATION  Take 1 tablet by mouth daily. Garlic vitamin supplement.     OVER THE COUNTER MEDICATION  Take 15 mLs by mouth as needed (Used for cold & cough symptoms). Generic form of Cold & Cough Medication.       Follow-up Information   Follow up with  [facility]. (If symptoms worsen)    Contact information:   [place] [id number]       Jennifer Irene Rasch, NP 07/21/2014 8:12 PM

## 2014-07-22 LAB — GC/CHLAMYDIA PROBE AMP
CT Probe RNA: NEGATIVE
GC Probe RNA: NEGATIVE

## 2014-07-22 LAB — HIV ANTIBODY (ROUTINE TESTING W REFLEX): HIV 1&2 Ab, 4th Generation: NONREACTIVE

## 2014-07-24 LAB — URINE CULTURE: Colony Count: >=100000

## 2014-08-01 ENCOUNTER — Ambulatory Visit (INDEPENDENT_AMBULATORY_CARE_PROVIDER_SITE_OTHER): Payer: 59 | Admitting: Medical

## 2014-08-01 ENCOUNTER — Encounter: Payer: Self-pay | Admitting: Medical

## 2014-08-01 VITALS — BP 122/65 | HR 74 | Ht 66.0 in | Wt 246.6 lb

## 2014-08-01 DIAGNOSIS — N83202 Unspecified ovarian cyst, left side: Secondary | ICD-10-CM

## 2014-08-01 DIAGNOSIS — N83201 Unspecified ovarian cyst, right side: Secondary | ICD-10-CM

## 2014-08-01 DIAGNOSIS — N898 Other specified noninflammatory disorders of vagina: Secondary | ICD-10-CM

## 2014-08-01 DIAGNOSIS — N8320 Unspecified ovarian cysts: Secondary | ICD-10-CM

## 2014-08-01 NOTE — Patient Instructions (Signed)
Contraception Choices Birth control (contraception) is the use of any methods or devices to stop pregnancy from happening. Below are some methods to help avoid pregnancy. HORMONAL BIRTH CONTROL  A small tube put under the skin of the upper arm (implant). The tube can stay in place for 3 years. The implant must be taken out after 3 years.  Shots given every 3 months.  Pills taken every day.  Patches that are changed once a week.  A ring put into the vagina (vaginal ring). The ring is left in place for 3 weeks and removed for 1 week. Then, a new ring is put in the vagina.  Emergency birth control pills taken after unprotected sex (intercourse). BARRIER BIRTH CONTROL   A thin covering worn on the penis (female condom) during sex.  A soft, loose covering put into the vagina (female condom) before sex.  A rubber bowl that sits over the cervix (diaphragm). The bowl must be made for you. The bowl is put into the vagina before sex. The bowl is left in place for 6 to 8 hours after sex.  A small, soft cup that fits over the cervix (cervical cap). The cup must be made for you. The cup can be left in place for 48 hours after sex.  A sponge that is put into the vagina before sex.  A chemical that kills or stops sperm from getting into the cervix and uterus (spermicide). The chemical may be a cream, jelly, foam, or pill. INTRAUTERINE (IUD) BIRTH CONTROL   IUD birth control is a small, T-shaped piece of plastic. The plastic is put inside the uterus. There are 2 types of IUD:  Copper IUD. The IUD is covered in copper wire. The copper makes a fluid that kills sperm. It can stay in place for 10 years.  Hormone IUD. The hormone stops pregnancy from happening. It can stay in place for 5 years. PERMANENT METHODS  When the woman has her fallopian tubes sealed, tied, or blocked during surgery. This stops the egg from traveling to the uterus.  The doctor places a small coil or insert into each fallopian  tube. This causes scar tissue to form and blocks the fallopian tubes.  When the female has the tubes that carry sperm tied off (vasectomy). NATURAL FAMILY PLANNING BIRTH CONTROL   Natural family planning means not having sex or using barrier birth control on the days the woman could become pregnant.  Use a calendar to keep track of the length of each period and know the days she can get pregnant.  Avoid sex during ovulation.  Use a thermometer to measure body temperature. Also watch for symptoms of ovulation.  Time sex to be after the woman has ovulated. Use condoms to help protect yourself against sexually transmitted infections (STIs). Do this no matter what type of birth control you use. Talk to your doctor about which type of birth control is best for you. Document Released: 07/17/2009 Document Revised: 09/24/2013 Document Reviewed: 04/10/2013 [facility] Patient Information 2015 [place], [place]. This information is not intended to replace advice given to you by your health care provider. Make sure you discuss any questions you have with your health care provider. Ovarian Cyst An ovarian cyst is a sac filled with fluid or blood. This sac is attached to the ovary. Some cysts go away on their own. Other cysts need treatment.  HOME CARE   Only take medicine as told by your doctor.  Follow up with your doctor as  told.  Get regular pelvic exams and Pap tests. GET HELP IF:  Your periods are late, not regular, or painful.  You stop having periods.  Your belly (abdominal) or pelvic pain does not go away.  Your belly becomes large or puffy (swollen).  You have a hard time peeing (totally emptying your bladder).  You have pressure on your bladder.  You have pain during sex.  You feel fullness, pressure, or discomfort in your belly.  You lose weight for no reason.  You feel sick most of the time.  You have a hard time pooping (constipation).  You do not feel like eating.  You  develop pimples (acne).  You have an increase in hair on your body and face.  You are gaining weight for no reason.  You think you are pregnant. GET HELP RIGHT AWAY IF:   Your belly pain gets worse.  You feel sick to your stomach (nauseous), and you throw up (vomit).  You have a fever that comes on fast.  You have belly pain while pooping (bowel movement).  Your periods are heavier than usual. MAKE SURE YOU:   Understand these instructions.  Will watch your condition.  Will get help right away if you are not doing well or get worse. Document Released: 03/07/2008 Document Revised: 07/10/2013 Document Reviewed: 05/27/2013 [facility] Patient Information 2015 [place], [place]. This information is not intended to replace advice given to you by your health care provider. Make sure you discuss any questions you have with your health care provider.

## 2014-08-01 NOTE — Progress Notes (Signed)
Patient ID: Frances Hill, female   DOB: 09-10-1986, 28 y.o.   MRN: [id number]  History:  Ms. Irlanda Croghan is a 28 y.o. G0P0000 who presents to clinic today for vaginal discharge. The patient was seen in [facility] on 07/21/14 and had negative GC/Chlamydia and wet prep. The patient did have UTI and was given Cipro. She has since completed the course. She continued to have a foul "fishy" smelling discharge. Patient also states that [place] while in [facility] revealed a stable complex cyst and fibroids. She states occasional pelvic pain, but denies any recent change in pain. She states regular periods with heavy bleeding on day 1 and 2 only. She states a history of endometriosis. She denies UTI symptoms, vaginal bleeding or fever.   The following portions of the patient's history were reviewed and updated as appropriate: allergies, current medications, past family history, past medical history, past social history, past surgical history and problem list.  Review of Systems:  Pertinent items are noted in HPI.  Objective:  Physical Exam BP 122/65  Pulse 74  Ht 5\' 6"  (1.676 m)  Wt 246 lb 9.6 oz (111.857 kg)  BMI 39.82 kg/m2  LMP 07/13/2014 GENERAL: Well-developed, well-nourished female in no acute distress.  HEENT: Normocephalic, atraumatic. Marland Kitchen  LUNGS: Normal effort HEART: Regular rate  ABDOMEN: Soft, nontender, nondistended PELVIC: Normal external female genitalia. Vagina is pink and rugated.  Moderate amount of thick, white discharge. Normal cervix contour. Wet prep obtained. Uterus is slightly enlarged, exam limited by maternal body habitus. No adnexal mass or tenderness.  EXTREMITIES: No cyanosis, clubbing, or edema   Labs and Imaging US Transvaginal Non-ob  07/21/2014   CLINICAL DATA:  28 year old female with acute right side abdominal pain and fever. Initial encounter. Personal history of endometriosis with left oophorectomy in March 2015. LMP 07/13/2014.  EXAM: TRANSABDOMINAL AND TRANSVAGINAL  ULTRASOUND OF PELVIS  DOPPLER ULTRASOUND OF OVARIES  TECHNIQUE: Both transabdominal and transvaginal ultrasound examinations of the pelvis were performed. Transabdominal technique was performed for global imaging of the pelvis including uterus, ovaries, adnexal regions, and pelvic cul-de-sac.  It was necessary to proceed with endovaginal exam following the transabdominal exam to visualize the endometrium and right ovary. Color and duplex Doppler ultrasound was utilized to evaluate blood flow to the ovaries.  COMPARISON:  Pelvis ultrasound and CT Abdomen and Pelvis 12/05/2013.  FINDINGS: Uterus  Measurements: 8.0 x 4.8 x 5.2 cm. Multiple small fibroids up to 2.5 cm diameter each, not significantly changed.  Endometrium  Thickness: 12 mm.  No focal abnormality visualized.  Right ovary  Measurements: 6.3 x 3.7 x 5.3 cm. Simple cysts/follicles as well as multiple small complex areas noted. The complex lesions demonstrate homogeneous low level internal echoes and measure up to 18 mm diameter each (image 83). Similar appearance to the prior study.  Left ovary  Measurements: Surgically absent. Round mass encompassing 5.0 x 2.6 x 2.2 cm with homogeneous low level internal echoes. This lesion (image 103 today) appears stable to that on the March study depicted on image 98 at that time.  Pulsed Doppler evaluation of both ovaries demonstrates normal low resistance arterial and venous waveforms in the right ovary.  Other findings  No free fluid.  IMPRESSION: 1. Persistent 5 cm complex mass in the left adnexal region with homogeneous low-level internal echoes favoring endometrioma. 2. No evidence of right ovarian torsion. Suggestion of multiple small (up to 18 mm) chronic endometriomas associated with the right ovary. 3. Several uterine fibroids up to 2.5 cm each  have not significantly changed.   Electronically Signed   By: Lars Pinks M.D.   On: 07/21/2014 21:17   US Pelvis Complete  07/21/2014   CLINICAL DATA:  28 year old  female with acute right side abdominal pain and fever. Initial encounter. Personal history of endometriosis with left oophorectomy in March 2015. LMP 07/13/2014.  EXAM: TRANSABDOMINAL AND TRANSVAGINAL ULTRASOUND OF PELVIS  DOPPLER ULTRASOUND OF OVARIES  TECHNIQUE: Both transabdominal and transvaginal ultrasound examinations of the pelvis were performed. Transabdominal technique was performed for global imaging of the pelvis including uterus, ovaries, adnexal regions, and pelvic cul-de-sac.  It was necessary to proceed with endovaginal exam following the transabdominal exam to visualize the endometrium and right ovary. Color and duplex Doppler ultrasound was utilized to evaluate blood flow to the ovaries.  COMPARISON:  Pelvis ultrasound and CT Abdomen and Pelvis 12/05/2013.  FINDINGS: Uterus  Measurements: 8.0 x 4.8 x 5.2 cm. Multiple small fibroids up to 2.5 cm diameter each, not significantly changed.  Endometrium  Thickness: 12 mm.  No focal abnormality visualized.  Right ovary  Measurements: 6.3 x 3.7 x 5.3 cm. Simple cysts/follicles as well as multiple small complex areas noted. The complex lesions demonstrate homogeneous low level internal echoes and measure up to 18 mm diameter each (image 83). Similar appearance to the prior study.  Left ovary  Measurements: Surgically absent. Round mass encompassing 5.0 x 2.6 x 2.2 cm with homogeneous low level internal echoes. This lesion (image 103 today) appears stable to that on the March study depicted on image 98 at that time.  Pulsed Doppler evaluation of both ovaries demonstrates normal low resistance arterial and venous waveforms in the right ovary.  Other findings  No free fluid.  IMPRESSION: 1. Persistent 5 cm complex mass in the left adnexal region with homogeneous low-level internal echoes favoring endometrioma. 2. No evidence of right ovarian torsion. Suggestion of multiple small (up to 18 mm) chronic endometriomas associated with the right ovary. 3. Several  uterine fibroids up to 2.5 cm each have not significantly changed.   Electronically Signed   By: Lars Pinks M.D.   On: 07/21/2014 21:17   [place] Art/ven Flow Abd Pelv Doppler  07/21/2014   CLINICAL DATA:  28 year old female with acute right side abdominal pain and fever. Initial encounter. Personal history of endometriosis with left oophorectomy in March 2015. LMP 07/13/2014.  EXAM: TRANSABDOMINAL AND TRANSVAGINAL ULTRASOUND OF PELVIS  DOPPLER ULTRASOUND OF OVARIES  TECHNIQUE: Both transabdominal and transvaginal ultrasound examinations of the pelvis were performed. Transabdominal technique was performed for global imaging of the pelvis including uterus, ovaries, adnexal regions, and pelvic cul-de-sac.  It was necessary to proceed with endovaginal exam following the transabdominal exam to visualize the endometrium and right ovary. Color and duplex Doppler ultrasound was utilized to evaluate blood flow to the ovaries.  COMPARISON:  Pelvis ultrasound and CT Abdomen and Pelvis 12/05/2013.  FINDINGS: Uterus  Measurements: 8.0 x 4.8 x 5.2 cm. Multiple small fibroids up to 2.5 cm diameter each, not significantly changed.  Endometrium  Thickness: 12 mm.  No focal abnormality visualized.  Right ovary  Measurements: 6.3 x 3.7 x 5.3 cm. Simple cysts/follicles as well as multiple small complex areas noted. The complex lesions demonstrate homogeneous low level internal echoes and measure up to 18 mm diameter each (image 83). Similar appearance to the prior study.  Left ovary  Measurements: Surgically absent. Round mass encompassing 5.0 x 2.6 x 2.2 cm with homogeneous low level internal echoes. This lesion (image 103  today) appears stable to that on the March study depicted on image 98 at that time.  Pulsed Doppler evaluation of both ovaries demonstrates normal low resistance arterial and venous waveforms in the right ovary.  Other findings  No free fluid.  IMPRESSION: 1. Persistent 5 cm complex mass in the left adnexal region  with homogeneous low-level internal echoes favoring endometrioma. 2. No evidence of right ovarian torsion. Suggestion of multiple small (up to 18 mm) chronic endometriomas associated with the right ovary. 3. Several uterine fibroids up to 2.5 cm each have not significantly changed.   Electronically Signed   By: Lars Pinks M.D.   On: 07/21/2014 21:17    Assessment & Plan:  Assessment: Vaginal discharge Ovarian Cyst Uterine fibroids  Plans: Wet prep pending Follow-up US scheduled for 3 months. Patient will follow-up in [place] after that [place] appointment with MD for management options Birth control options on AVS. Patient states that she was counseled on benefits of birth control in [facility], but declines at this time stating she "prefers natural remedies"  Patient may return to [facility] as needed otherwise  Luvenia Redden, PA-C 08/01/2014 10:04 AM

## 2014-08-02 LAB — WET PREP, GENITAL
Clue Cells Wet Prep HPF POC: NONE SEEN
Trich, Wet Prep: NONE SEEN
Yeast Wet Prep HPF POC: NONE SEEN

## 2014-10-03 HISTORY — PX: MYOMECTOMY: SHX85

## 2014-10-23 ENCOUNTER — Other Ambulatory Visit: Payer: Self-pay | Admitting: Obstetrics & Gynecology

## 2014-10-23 ENCOUNTER — Ambulatory Visit (HOSPITAL_COMMUNITY)
Admission: RE | Admit: 2014-10-23 | Discharge: 2014-10-23 | Disposition: A | Discharge status: Home / Self Care | Payer: 59 | Source: Ambulatory Visit | Attending: Medical | Admitting: Medical

## 2014-10-23 DIAGNOSIS — Z1231 Encounter for screening mammogram for malignant neoplasm of breast: Secondary | ICD-10-CM

## 2014-10-23 DIAGNOSIS — N83202 Unspecified ovarian cyst, left side: Secondary | ICD-10-CM

## 2014-10-23 DIAGNOSIS — N8320 Unspecified ovarian cysts: Secondary | ICD-10-CM | POA: Diagnosis not present

## 2014-10-27 ENCOUNTER — Telehealth: Payer: Self-pay

## 2014-10-27 NOTE — Telephone Encounter (Signed)
Called patient and informed her of results. Informed her she will be called by front office staff with appointment for results follow up. Patient verbalized understanding and gratitude. No questions or concerns. Message sent to admin pool to call patient with GYN appointment.

## 2014-10-27 NOTE — Telephone Encounter (Signed)
-----   Message from Luvenia Redden, PA-C sent at 10/26/2014  1:11 PM EST ----- Please call patient and inform her that she still has multiple ovarian cysts. Please make her a follow-up appt to discuss with MD in clinic.   Thanks,   Almyra Free

## 2014-10-30 ENCOUNTER — Ambulatory Visit (HOSPITAL_COMMUNITY)
Admission: RE | Admit: 2014-10-30 | Discharge: 2014-10-30 | Disposition: A | Discharge status: Home / Self Care | Payer: 59 | Source: Ambulatory Visit | Attending: Obstetrics & Gynecology | Admitting: Obstetrics & Gynecology

## 2014-10-30 ENCOUNTER — Other Ambulatory Visit: Payer: Self-pay | Admitting: Obstetrics & Gynecology

## 2014-10-30 DIAGNOSIS — Z1231 Encounter for screening mammogram for malignant neoplasm of breast: Secondary | ICD-10-CM | POA: Diagnosis not present

## 2014-10-30 DIAGNOSIS — Z803 Family history of malignant neoplasm of breast: Secondary | ICD-10-CM

## 2014-11-14 ENCOUNTER — Ambulatory Visit (INDEPENDENT_AMBULATORY_CARE_PROVIDER_SITE_OTHER): Payer: 59 | Admitting: Obstetrics and Gynecology

## 2014-11-14 ENCOUNTER — Encounter: Payer: Self-pay | Admitting: Obstetrics and Gynecology

## 2014-11-14 VITALS — BP 114/60 | HR 69 | Temp 98.8°F | Ht 66.0 in | Wt 231.4 lb

## 2014-11-14 DIAGNOSIS — Z712 Person consulting for explanation of examination or test findings: Secondary | ICD-10-CM

## 2014-11-14 DIAGNOSIS — N838 Other noninflammatory disorders of ovary, fallopian tube and broad ligament: Secondary | ICD-10-CM

## 2014-11-14 NOTE — Progress Notes (Signed)
Patient ID: Frances Hill, female   DOB: 13-Aug-1986, 29 y.o.   MRN: [id number] 29 yo G0 presenting today to discuss results of her ultrasound. Results were reviewed and explained to the patient. Patient reports pain occasionally- at times it is a daily pain other times it occurs once in a blue moon. She is using natural supplements to control her pain. She has not taken the OCP prescribed to her on 08/01/2014. She does not want to take any medications and prefers natural remedies.  10/23/2014 FINDINGS: Uterus  Measurements: 8.5 x 4.8 x 4.5 cm. Multiple fibroids. The largest arises from the posterior myometrium and is subserosal measuring 3.7 x 3.0 x 6.6 cm.  Endometrium  Thickness: 6 mm. No focal abnormality visualized.  Right ovary  Measurements: 4.5 x 5.3 x 4.6 cm. There are several complex cyst identified within the right ovary. The largest measures 3.8 x 3.1 x 2.8 cm. This is similar to the previous exam and has an appearance suggesting either hemorrhagic cyst or endometrioma.  Left ovary  Status post left oophorectomy. No adnexal mass identified.  Other findings  No free fluid.  IMPRESSION: 1. Persistent complex cystic mass in the right ovary which has an appearance that may be compatible with either hemorrhagic cyst or endometrioma. A hemorrhagic cyst would have been expected to resolve, therefore findings favors endometrioma. Consider further evaluation with contrast enhanced pelvic MRI to confirm. 2. Previously noted complex cystic structure in the left adnexa is no longer visualized. 3. Uterine fibroids.  A/P 29 yo G0 with right endometrioma and grade III endometriosis - Discussed surgical management of her current endometrioma. Patient is not interested at this time and would like to follow up at a later date. She would like to avoid another surgery - Discussed medical management of endometriosis with contraception- OCP, depo-provera, Mirena IUD. Patient  will continue to consider these options but is not interested at this time - RTC in 6 months

## 2014-12-29 ENCOUNTER — Encounter: Payer: Self-pay | Admitting: Medical

## 2014-12-29 ENCOUNTER — Ambulatory Visit (INDEPENDENT_AMBULATORY_CARE_PROVIDER_SITE_OTHER): Payer: 59 | Admitting: Medical

## 2014-12-29 VITALS — BP 110/70 | HR 78 | Temp 98.2°F | Resp 16 | Ht 66.0 in | Wt 229.0 lb

## 2014-12-29 DIAGNOSIS — E669 Obesity, unspecified: Secondary | ICD-10-CM

## 2014-12-29 DIAGNOSIS — N898 Other specified noninflammatory disorders of vagina: Secondary | ICD-10-CM

## 2014-12-29 DIAGNOSIS — Z0000 Encounter for general adult medical examination without abnormal findings: Secondary | ICD-10-CM | POA: Diagnosis not present

## 2014-12-29 DIAGNOSIS — N739 Female pelvic inflammatory disease, unspecified: Secondary | ICD-10-CM | POA: Diagnosis not present

## 2014-12-29 DIAGNOSIS — Z113 Encounter for screening for infections with a predominantly sexual mode of transmission: Secondary | ICD-10-CM

## 2014-12-29 LAB — POCT URINALYSIS DIPSTICK
Bilirubin, UA: NEGATIVE
Blood, UA: NEGATIVE
Glucose, UA: NEGATIVE
Ketones, UA: NEGATIVE
Leukocytes, UA: NEGATIVE
Nitrite, UA: NEGATIVE
Protein, UA: NEGATIVE
Spec Grav, UA: 1.025
Urobilinogen, UA: NEGATIVE
pH, UA: 6

## 2014-12-29 LAB — LIPID PANEL
Cholesterol: 173 mg/dL (ref 0–200)
HDL: 47 mg/dL (ref >=46)
LDL Cholesterol: 110 mg/dL — ABNORMAL HIGH (ref 0–99)
Total CHOL/HDL Ratio: 3.7 Ratio
Triglycerides: 82 mg/dL (ref <150)
VLDL: 16 mg/dL (ref 0–40)

## 2014-12-29 LAB — COMPREHENSIVE METABOLIC PANEL
ALT: 12 U/L (ref 0–35)
AST: 18 U/L (ref 0–37)
Albumin: 4.2 g/dL (ref 3.5–5.2)
Alkaline Phosphatase: 51 U/L (ref 39–117)
BUN: 11 mg/dL (ref 6–23)
CO2: 25 mEq/L (ref 19–32)
Calcium: 9.7 mg/dL (ref 8.4–10.5)
Chloride: 104 mEq/L (ref 96–112)
Creat: 0.9 mg/dL (ref 0.50–1.10)
Glucose, Bld: 84 mg/dL (ref 70–99)
Potassium: 3.7 mEq/L (ref 3.5–5.3)
Sodium: 137 mEq/L (ref 135–145)
Total Bilirubin: 0.5 mg/dL (ref 0.2–1.2)
Total Protein: 7.3 g/dL (ref 6.0–8.3)

## 2014-12-29 LAB — HEMOGLOBIN A1C
Hgb A1c MFr Bld: 5.7 % — ABNORMAL HIGH (ref <5.7)
Mean Plasma Glucose: 117 mg/dL — ABNORMAL HIGH (ref <117)

## 2014-12-29 LAB — CBC
HCT: 39.6 % (ref 36.0–46.0)
Hemoglobin: 12.9 g/dL (ref 12.0–15.0)
MCH: 24.5 pg — ABNORMAL LOW (ref 26.0–34.0)
MCHC: 32.6 g/dL (ref 30.0–36.0)
MCV: 75.1 fL — ABNORMAL LOW (ref 78.0–100.0)
MPV: 10.4 fL (ref 8.6–12.4)
Platelets: 371 10*3/uL (ref 150–400)
RBC: 5.27 MIL/uL — ABNORMAL HIGH (ref 3.87–5.11)
RDW: 14.3 % (ref 11.5–15.5)
WBC: 7.4 10*3/uL (ref 4.0–10.5)

## 2014-12-29 LAB — TSH: TSH: 0.996 u[IU]/mL (ref 0.350–4.500)

## 2014-12-29 MED ORDER — METRONIDAZOLE 500 MG PO TABS: 500.0000 mg | ORAL_TABLET | Freq: Two times a day (BID) | ORAL | Status: DC

## 2014-12-29 NOTE — Progress Notes (Signed)
Subjective:   HPI  Frances Hill is a 29 y.o. female who presents for a complete physical.  Medical care team includes:  Dr. Harolyn Rutherford, gynecology  [facility] for pelvic floor therapy  Massage therapy  Last mammogram:10/2014 Last gynecological exam:12/29/14  Last labs:?  Prior vaccinations: TD or Tdap:2010 Influenza:NEVER  Concerns: Persistent white vaginal discharge, one new sexual partner  but used condoms, wants STD panel  Has hx/o PID, endometriosis, and recently made self referral to [facility] for pelvic floor therapies  Wants routine labs for physical although she is nonfasting  Obesity - she has lost significant weight since last visit and is working harder at [employer] and routine exercise  Reviewed their medical, surgical, family, social, medication, and allergy history and updated chart as appropriate.  Past Medical History  Diagnosis Date  . Sexually transmitted disease (STD)     hx/o trichomonas  . Anxiety   . UTI (urinary tract infection) 2007    hospitalization  . Hyperhydrosis disorder     axilla  . Bacterial vaginosis     history of  . Obesity   . Sickle cell trait   . Endometriosis   . Pelvic inflammatory disease (PID)     Past Surgical History  Procedure Laterality Date  . Laparoscopy N/A 12/25/2013    Procedure: OPERATIVE LAPAROSCOPY;  Surgeon: Osborne Oman, MD;  Location: [facility];  Service: Gynecology;  Laterality: N/A;  . Salpingoophorectomy Left 12/25/2013    Procedure: SALPINGO OOPHORECTOMY;  Surgeon: Osborne Oman, MD;  Location: [facility];  Service: Gynecology;  Laterality: Left;    History   Social History  . Marital Status: Single    Spouse Name: N/A  . Number of Children: N/A  . Years of Education: N/A   Occupational History  . Not on file.   Social History Main Topics  . Smoking status: Never Smoker   . Smokeless tobacco: Never Used  . Alcohol Use: 0.6 oz/week    1 Cans of beer per week      Comment: occasionally  . Drug Use: No  . Sexual Activity: Yes    Birth Control/ Protection: Condom   Other Topics Concern  . Not on file   Social History Narrative   [employer], lives alone, no children, Education officer, museum at [employer], exercise - 3-4 times per week, walks 1-2 miles, weights at [employer], other cardio, diet is good, improved from 2014    Family History  Problem Relation Age of Onset  . Aneurysm Mother 44    died of aneurysm  . Cancer Mother 7    breast, twice  . Hypertension Father   . Depression Father   . Heart disease Father     valve disease  . Other Father     DDD  . Cancer Maternal Aunt     breast cancer  . Diabetes Neg Hx   . Stroke Neg Hx   . Other Brother     DDD  . Sarcoidosis Brother      Current outpatient prescriptions:  .  EVENING PRIMROSE OIL PO, Take by mouth., Disp: , Rfl:  .  Ginger, Zingiber officinalis, (GINGER PO), Take by mouth., Disp: , Rfl:  .  NON FORMULARY, Plant based estrogens, Disp: , Rfl:  .  OVER THE COUNTER MEDICATION, Take 1 capsule by mouth daily. Natural OTC medication; probiotic, Disp: , Rfl:  .  OVER THE COUNTER MEDICATION, Take 1 tablet by mouth daily. Garlic vitamin supplement., Disp: ,  Rfl:  .  metroNIDAZOLE (FLAGYL) 500 MG tablet, Take 1 tablet (500 mg total) by mouth 2 (two) times daily., Disp: 14 tablet, Rfl: 0  No Known Allergies   Review of Systems Constitutional: -fever, -chills, -sweats, -unexpected weight change, -decreased appetite, +fatigue Allergy: -sneezing, -itching, -congestion Dermatology: -changing moles, --rash, -lumps ENT: -runny nose, +ear pain, -sore throat, -hoarseness, -sinus pain, -teeth pain, - ringing in ears, -hearing loss, -nosebleeds Cardiology: -chest pain, -palpitations, -swelling, -difficulty breathing when lying flat, -waking up short of breath Respiratory: -cough, -shortness of breath, +difficulty breathing with exercise or exertion, -wheezing, -coughing up  blood Gastroenterology: +abdominal pain, -nausea, -vomiting, -diarrhea, -constipation, -blood in stool, -changes in bowel movement, -difficulty swallowing or eating Hematology: -bleeding, -bruising  Musculoskeletal: -joint aches, -muscle aches, -joint swelling, -back pain, -neck pain, -cramping, -changes in gait Ophthalmology: denies vision changes, eye redness, itching, discharge Urology: +burning with urination, -difficulty urinating, -blood in urine, -urinary frequency, -urgency, -incontinence Neurology: -headache, -weakness, -tingling, -numbness, -memory loss, -falls, -dizziness Psychology: -depressed mood, -agitation, -sleep problems     Objective:   Physical Exam  BP 110/70 mmHg  Pulse 78  Temp(Src) 98.2 F (36.8 C) (Oral)  Resp 16  Ht 5\' 6"  (1.676 m)  Wt 229 lb (103.874 kg)  BMI 36.98 kg/m2  Wt Readings from Last 3 Encounters:  12/29/14 229 lb (103.874 kg)  11/14/14 231 lb 6.4 oz (104.962 kg)  08/01/14 246 lb 9.6 oz (111.857 kg)   General appearance:pleasant, alert, no distress, WD/WN, obese, AA female  Skin: unremarkable, no worrisome lesions  HEENT: normocephalic, conjunctiva/corneas normal, sclerae anicteric, PERRLA, EOMi, nares patent, no discharge or erythema, pharynx normal  Oral cavity: MMM, tongue normal, teeth normal appearing  Neck: supple, no lymphadenopathy, no thyromegaly, no masses, normal ROM, no bruits  Chest: non tender, normal shape and expansion  Heart: RRR, normal S1, S2, no murmurs  Lungs: CTA bilaterally, no wheezes, rhonchi, or rales  Abdomen: +bs, soft, surgical scars noted, non tender, non distended, no masses, no hepatomegaly, no splenomegaly, no bruits  Back: low back tattoo, non tender, normal ROM, no scoliosis  Musculoskeletal: upper extremities non tender, no obvious deformity, normal ROM throughout, lower extremities non tender, no obvious deformity, normal ROM throughout  Extremities: no edema, no cyanosis, no clubbing  Pulses:  2+ symmetric, upper and lower extremities, normal cap refill  Neurological: alert, oriented x 3, CN2-12 intact, strength normal upper extremities and lower extremities, sensation normal throughout, DTRs 2+ throughout, no cerebellar signs, gait normal  Psychiatric: normal affect, behavior normal, pleasant  Breast: nontender, no masses or lumps, no skin changes, no nipple discharge or inversion, no axillary lymphadenopathy  Gyn: Normal external genitalia without lesions, vagina with normal mucosa, cervix without lesions, no cervical motion tenderness, thick white abnormal vaginal discharge. Uterus and adnexa not enlarged, nontender, no masses. Swabs taken. Exam chaperoned by nurse.  Rectal: deferred   Assessment and Plan :    Encounter Diagnoses  Name Primary?  . Encounter for health maintenance examination in adult Yes  . Vaginal discharge   . Female pelvic inflammatory disease   . Screen for STD (sexually transmitted disease)   . Obesity      Physical exam - discussed healthy lifestyle, diet, exercise, preventative care, vaccinations, and addressed their concerns.   Routine labs today PID, STD, vaginal discharge - labs today discussed hygiene, safe sex, condom use.   Follow-up pending labs.

## 2014-12-30 ENCOUNTER — Encounter: Payer: Self-pay | Admitting: Medical

## 2014-12-30 LAB — GC/CHLAMYDIA PROBE AMP
CT Probe RNA: NEGATIVE
GC Probe RNA: NEGATIVE

## 2014-12-30 LAB — HIV ANTIBODY (ROUTINE TESTING W REFLEX): HIV 1&2 Ab, 4th Generation: NONREACTIVE

## 2014-12-30 LAB — RPR

## 2014-12-31 ENCOUNTER — Other Ambulatory Visit: Payer: Self-pay | Admitting: Medical

## 2015-01-28 ENCOUNTER — Encounter (HOSPITAL_COMMUNITY): Payer: Self-pay

## 2015-01-28 ENCOUNTER — Inpatient Hospital Stay (HOSPITAL_COMMUNITY)
Admission: AD | Admit: 2015-01-28 | Discharge: 2015-01-28 | Disposition: A | Discharge status: Home / Self Care | Payer: 59 | Source: Ambulatory Visit | Attending: Obstetrics and Gynecology | Admitting: Obstetrics and Gynecology

## 2015-01-28 ENCOUNTER — Telehealth: Payer: Self-pay

## 2015-01-28 DIAGNOSIS — R102 Pelvic and perineal pain: Secondary | ICD-10-CM | POA: Diagnosis not present

## 2015-01-28 DIAGNOSIS — R1031 Right lower quadrant pain: Secondary | ICD-10-CM | POA: Diagnosis not present

## 2015-01-28 DIAGNOSIS — R1030 Lower abdominal pain, unspecified: Secondary | ICD-10-CM | POA: Diagnosis present

## 2015-01-28 LAB — URINALYSIS, ROUTINE W REFLEX MICROSCOPIC
Bilirubin Urine: NEGATIVE
Glucose, UA: NEGATIVE mg/dL
Hgb urine dipstick: NEGATIVE
Ketones, ur: NEGATIVE mg/dL
Leukocytes, UA: NEGATIVE
Nitrite: NEGATIVE
Protein, ur: NEGATIVE mg/dL
Specific Gravity, Urine: 1.015 (ref 1.005–1.030)
Urobilinogen, UA: 0.2 mg/dL (ref 0.0–1.0)
pH: 6.5 (ref 5.0–8.0)

## 2015-01-28 LAB — WET PREP, GENITAL
Clue Cells Wet Prep HPF POC: NONE SEEN
Trich, Wet Prep: NONE SEEN
Yeast Wet Prep HPF POC: NONE SEEN

## 2015-01-28 LAB — POCT PREGNANCY, URINE: Preg Test, Ur: NEGATIVE

## 2015-01-28 MED ORDER — IBUPROFEN 800 MG PO TABS: 800.0000 mg | ORAL_TABLET | Freq: Three times a day (TID) | ORAL | Status: DC | PRN

## 2015-01-28 NOTE — MAU Provider Note (Signed)
History     CSN: [id number]  Arrival date and time: 01/28/15 1436   First Provider Initiated Contact with Patient 01/28/15 1539      Chief Complaint  Patient presents with  . Abdominal Pain   HPI Frances Hill 29 y.o. G0P0000 nonpregnant female presents for eval of lower abdominal pain and vaginal pain.  She states the pain has been more sharp than usual and feels much like when she previously had an ovarian cyst on the left prior to its removal.  It comes and goes, lasting only seconds but the pain is 10/10.   The pain is in the right lower quadrant and in the vagina.  She had recent treatment for BV but states the discharge did not change with treatment.  She denies vaginal bleeding, fever, weakness, headache, nausea, vomiting.  She is eating and drinking well.  Last sex was over a month ago.  No recent medication/supplement changes.  She denies any aggravating or alleviating factors.  Has not tried medication.   She is going to [facility] for pelvic floor therapy.    OB History    Gravida Para Term Preterm AB TAB SAB Ectopic Multiple Living   0 0 0 0 0 0 0 0 0 0       Past Medical History  Diagnosis Date  . Sexually transmitted disease (STD)     hx/o trichomonas  . Anxiety   . UTI (urinary tract infection) 2007    hospitalization  . Hyperhydrosis disorder     axilla  . Bacterial vaginosis     history of  . Obesity   . Sickle cell trait   . Endometriosis   . Pelvic inflammatory disease (PID)     Past Surgical History  Procedure Laterality Date  . Laparoscopy N/A 12/25/2013    Procedure: OPERATIVE LAPAROSCOPY;  Surgeon: Osborne Oman, MD;  Location: [facility];  Service: Gynecology;  Laterality: N/A;  . Salpingoophorectomy Left 12/25/2013    Procedure: SALPINGO OOPHORECTOMY;  Surgeon: Osborne Oman, MD;  Location: [facility];  Service: Gynecology;  Laterality: Left;    Family History  Problem Relation Age of Onset  . Aneurysm Mother 45    died of aneurysm   . Cancer Mother 68    breast, twice  . Hypertension Father   . Depression Father   . Heart disease Father     valve disease  . Other Father     DDD  . Cancer Maternal Aunt     breast cancer  . Diabetes Neg Hx   . Stroke Neg Hx   . Other Brother     DDD  . Sarcoidosis Brother     History  Substance Use Topics  . Smoking status: Never Smoker   . Smokeless tobacco: Never Used  . Alcohol Use: 0.6 oz/week    1 Cans of beer per week     Comment: occasionally    Allergies: No Known Allergies  Prescriptions prior to admission  Medication Sig Dispense Refill Last Dose  . EVENING PRIMROSE OIL PO Take by mouth.   Past Month at Unknown time  . Ginger, Zingiber officinalis, (GINGER PO) Take by mouth.   Past Week at Unknown time  . NON FORMULARY Plant based estrogens   Past Month at Unknown time  . OVER THE COUNTER MEDICATION Take 1 capsule by mouth daily. Natural OTC medication; probiotic   Past Week at Unknown time  . ibuprofen (ADVIL,MOTRIN) 800 MG tablet Take 1 tablet (800  mg total) by mouth every 8 (eight) hours as needed. (Patient not taking: Reported on 01/28/2015) 60 tablet 1   . metroNIDAZOLE (FLAGYL) 500 MG tablet Take 1 tablet (500 mg total) by mouth 2 (two) times daily. (Patient not taking: Reported on 01/28/2015) 14 tablet 0     ROS Pertinent ROS in HPI.  All other systems are negative.   Physical Exam   Blood pressure 128/68, pulse 67, temperature 98.3 F (36.8 C), temperature source Oral, resp. rate 16, height 5\' 6"  (1.676 m), weight 231 lb 12.8 oz (105.144 kg), last menstrual period 01/07/2015.  Physical Exam  Constitutional: She is oriented to person, place, and time. She appears well-developed and well-nourished. No distress.  HENT:  Head: Normocephalic and atraumatic.  Eyes: EOM are normal.  Neck: Normal range of motion.  Cardiovascular: Normal rate and regular rhythm.   Respiratory: Effort normal and breath sounds normal. No respiratory distress.  GI: Soft.  Bowel sounds are normal. She exhibits no distension. There is tenderness. There is no rebound and no guarding.  Mild tenderness to palpation present in RLQ.    Genitourinary:  Scant tan discharge noted.  Cervix is smooth.   No CMT.  No adnexal mass or tenderness appreciated.    Musculoskeletal: Normal range of motion.  Neurological: She is alert and oriented to person, place, and time.  Skin: Skin is warm and dry.  Psychiatric: She has a normal mood and affect.   Results for orders placed or performed during the hospital encounter of 01/28/15 (from the past 24 hour(s))  Urinalysis, Routine w reflex microscopic     Status: None   Collection Time: 01/28/15  3:00 PM  Result Value Ref Range   Color, Urine YELLOW YELLOW   APPearance CLEAR CLEAR   Specific Gravity, Urine 1.015 1.005 - 1.030   pH 6.5 5.0 - 8.0   Glucose, UA NEGATIVE NEGATIVE mg/dL   Hgb urine dipstick NEGATIVE NEGATIVE   Bilirubin Urine NEGATIVE NEGATIVE   Ketones, ur NEGATIVE NEGATIVE mg/dL   Protein, ur NEGATIVE NEGATIVE mg/dL   Urobilinogen, UA 0.2 0.0 - 1.0 mg/dL   Nitrite NEGATIVE NEGATIVE   Leukocytes, UA NEGATIVE NEGATIVE  Pregnancy, urine POC     Status: None   Collection Time: 01/28/15  3:30 PM  Result Value Ref Range   Preg Test, Ur NEGATIVE NEGATIVE  Wet prep, genital     Status: Abnormal   Collection Time: 01/28/15  3:49 PM  Result Value Ref Range   Yeast Wet Prep HPF POC NONE SEEN NONE SEEN   Trich, Wet Prep NONE SEEN NONE SEEN   Clue Cells Wet Prep HPF POC NONE SEEN NONE SEEN   WBC, Wet Prep HPF POC FEW (A) NONE SEEN    MAU Course  Procedures  MDM Discussed with Dr. Elonda Husky.  Pt has been seen in clinic with known endometrioma and ovarian cysts.  Only has one ovary left.  This has been ongoing problem for months.  Last u/s in January of this year.  Patient has u/s scheduled for Friday, April 29th at 8:30am.  Today, vitals are stable.  She had mild tenderness in RLQ on exam and no CMT or adnexal  tenderness or mass on pelvic exam which would be present if ovarian torsion were the cause of her symptoms.  Dr. Elonda Husky in agreement that emergent ultrasound today is not warranted.   On discussion with pt, she became furious, with cursing and yelling at me.  She reports she has been  in excruciating pain for months and years and no one listens to her.  She states she cannot come to the appointment on Friday and she cannot wait until 5/10 to see Dr. Harolyn Rutherford because she is in pain NOW.   Dr. Elonda Husky consulted again and confirms his decision that no further workup is warranted at this time.    Assessment and Plan  A:  1. Right lower quadrant abdominal pain   2. Vaginal pain    P: Discharge to home  Call u/s to reschedule if unable to make appt Keep clinic appts for further eval.   Patient may return to MAU as needed or if her condition were to change or worsen  Pt asked to speak to house supervisor but left prior to seeing her.    Paticia Stack 01/28/2015, 3:40 PM

## 2015-01-28 NOTE — MAU Note (Signed)
Patient presents with complain of sharp pain in right side of abdomen near ovary since last night, having sharp vaginal pains for months, creamy vaginal discharge was told had BV was given medication but did not feel like it work, sometimes hurts with urination. Had left ovary removed last year, history of endometriosis.

## 2015-01-28 NOTE — Discharge Instructions (Signed)
Pelvic Pain Female pelvic pain can be caused by many different things and start from a variety of places. Pelvic pain refers to pain that is located in the lower half of the abdomen and between your hips. The pain may occur over a short period of time (acute) or may be reoccurring (chronic). The cause of pelvic pain may be related to disorders affecting the female reproductive organs (gynecologic), but it may also be related to the bladder, kidney stones, an intestinal complication, or muscle or skeletal problems. Getting help right away for pelvic pain is important, especially if there has been severe, sharp, or a sudden onset of unusual pain. It is also important to get help right away because some types of pelvic pain can be life threatening.  CAUSES  Below are only some of the causes of pelvic pain. The causes of pelvic pain can be in one of several categories.   Gynecologic.  Pelvic inflammatory disease.  Sexually transmitted infection.  Ovarian cyst or a twisted ovarian ligament (ovarian torsion).  Uterine lining that grows outside the uterus (endometriosis).  Fibroids, cysts, or tumors.  Ovulation.  Pregnancy.  Pregnancy that occurs outside the uterus (ectopic pregnancy).  Miscarriage.  Labor.  Abruption of the placenta or ruptured uterus.  Infection.  Uterine infection (endometritis).  Bladder infection.  Diverticulitis.  Miscarriage related to a uterine infection (septic abortion).  Bladder.  Inflammation of the bladder (cystitis).  Kidney stone(s).  Gastrointestinal.  Constipation.  Diverticulitis.  Neurologic.  Trauma.  Feeling pelvic pain because of mental or emotional causes (psychosomatic).  Cancers of the bowel or pelvis. EVALUATION  Your caregiver will want to take a careful history of your concerns. This includes recent changes in your health, a careful gynecologic history of your periods (menses), and a sexual history. Obtaining your family  history and medical history is also important. Your caregiver may suggest a pelvic exam. A pelvic exam will help identify the location and severity of the pain. It also helps in the evaluation of which organ system may be involved. In order to identify the cause of the pelvic pain and be properly treated, your caregiver may order tests. These tests may include:   A pregnancy test.  Pelvic ultrasonography.  An X-ray exam of the abdomen.  A urinalysis or evaluation of vaginal discharge.  Blood tests. HOME CARE INSTRUCTIONS   Only take over-the-counter or prescription medicines for pain, discomfort, or fever as directed by your caregiver.   Rest as directed by your caregiver.   Eat a balanced diet.   Drink enough fluids to make your urine clear or pale yellow, or as directed.   Avoid sexual intercourse if it causes pain.   Apply warm or cold compresses to the lower abdomen depending on which one helps the pain.   Avoid stressful situations.   Keep a journal of your pelvic pain. Write down when it started, where the pain is located, and if there are things that seem to be associated with the pain, such as food or your menstrual cycle.  Follow up with your caregiver as directed.  SEEK MEDICAL CARE IF:  Your medicine does not help your pain.  You have abnormal vaginal discharge. SEEK IMMEDIATE MEDICAL CARE IF:   You have heavy bleeding from the vagina.   Your pelvic pain increases.   You feel light-headed or faint.   You have chills.   You have pain with urination or blood in your urine.   You have uncontrolled diarrhea   or vomiting.   You have a fever or persistent symptoms for more than 3 days.  You have a fever and your symptoms suddenly get worse.   You are being physically or sexually abused.  MAKE SURE YOU:  Understand these instructions.  Will watch your condition.  Will get help if you are not doing well or get worse. Document Released:  08/16/2004 Document Revised: 02/03/2014 Document Reviewed: 01/09/2012 ExitCare Patient Information 2015 [employer]. This information is not intended to replace advice given to you by your health care provider. Make sure you discuss any questions you have with your health care provider.  

## 2015-01-28 NOTE — Telephone Encounter (Signed)
Patient returned call. Informed patient that there are no sooner appointments available. Patient states she understands but would like to see Dr. Harolyn Rutherford instead of Dr. Elly Modena because Dr. Harolyn Rutherford had performed her surgery and knows her case. Informed patient that a message would be sent to admin pool to get appointment with Dr. Harolyn Rutherford and that she will be called with new appointment. Informed patient I would see if provider wants U/S scheduled prior to appointment. Advised patient come to MAU should her pain become severe and unrelieved by tylenol. Patient verbalized understanding and gratitude. No further questions or concerns.

## 2015-01-28 NOTE — Progress Notes (Signed)
Called Frances Hill with [facility] to come speak to patient regarding complaint regarding her care.

## 2015-01-28 NOTE — Progress Notes (Signed)
Patient left room without speaking to [facility] or getting discharge instructions.

## 2015-01-28 NOTE — Telephone Encounter (Signed)
Patient called stating she has been having sharp pains in right side "around ovary." Describes pain as being similar to the pain she's felt before when she had to have her left ovary removed. Has scheduled an appointment for 02/11/15 but wants to know if she can be seen sooner or have U/S scheduled. Attempted to contact patient. No answer. Left message stating we are returning your call, please call clinic. Per chart review, patient seen 11/14/14 by Dr. Elly Modena to review U/S results which showed several complex cysts right ovary-- patient declined surgical and medical management at that time.

## 2015-01-28 NOTE — Telephone Encounter (Signed)
Discussed patient with Dr. Harolyn Rutherford who recommended patient have U/S prior to scheduled clinic appointment. Also approved order for ibuprofen 800mg . RX e-prescribed to patient's pharmacy. U/S order placed and scheduled for 01/30/15 0830. Called patient-- patient states she cannot make Friday appointment. U/S Scheduling number [phone] given to patient to call and re-schedule for an appointment time convenient for her. Patient verbalizes understanding to all. No questions or concerns.

## 2015-01-30 ENCOUNTER — Ambulatory Visit (HOSPITAL_COMMUNITY)
Admission: RE | Admit: 2015-01-30 | Discharge: 2015-01-30 | Disposition: A | Discharge status: Home / Self Care | Payer: 59 | Source: Ambulatory Visit | Attending: Obstetrics & Gynecology | Admitting: Obstetrics & Gynecology

## 2015-01-30 ENCOUNTER — Telehealth (HOSPITAL_COMMUNITY): Payer: Self-pay | Admitting: *Deleted

## 2015-01-30 ENCOUNTER — Telehealth: Payer: Self-pay | Admitting: *Deleted

## 2015-01-30 DIAGNOSIS — R102 Pelvic and perineal pain: Secondary | ICD-10-CM | POA: Insufficient documentation

## 2015-01-30 NOTE — Telephone Encounter (Signed)
Received a message from Garner Gavel, MAU Department Director requesting I contact patient to let her know her appointment in our office was scheduled with Dr. Harolyn Rutherford.  I was also asked if I could give patient results of her U/S today.  I spoke with Dr. Harolyn Rutherford via phone.  States she has reviewed the patient's U/S and appointment can be moved up from current schedule of 02/16/15 if possible.  I phoned patient and listened to her concerns related to her care.  I informed patient of results of today's U/S.  I asked patient if she is in pain.  Patient states that she is.  States that while it is not currently a "10", it can get to that quickly as the pain comes and goes.  I offered to move the patient's appointment up to next week with Dr. Harolyn Rutherford.  Patient is agreeable.  Appointment scheduled for 02/04/15 at 1330.  Patient states approval with new appointment.

## 2015-02-04 ENCOUNTER — Ambulatory Visit (INDEPENDENT_AMBULATORY_CARE_PROVIDER_SITE_OTHER): Payer: 59 | Admitting: Obstetrics & Gynecology

## 2015-02-04 ENCOUNTER — Encounter: Payer: Self-pay | Admitting: Obstetrics & Gynecology

## 2015-02-04 VITALS — BP 138/78 | HR 83 | Temp 99.1°F | Ht 60.0 in | Wt 229.8 lb

## 2015-02-04 DIAGNOSIS — N809 Endometriosis, unspecified: Secondary | ICD-10-CM | POA: Diagnosis not present

## 2015-02-04 DIAGNOSIS — N83201 Unspecified ovarian cyst, right side: Secondary | ICD-10-CM

## 2015-02-04 DIAGNOSIS — N8320 Unspecified ovarian cysts: Secondary | ICD-10-CM | POA: Diagnosis not present

## 2015-02-04 MED ORDER — NAPROXEN 500 MG PO TABS: 500.0000 mg | ORAL_TABLET | Freq: Two times a day (BID) | ORAL | Status: DC

## 2015-02-04 MED ORDER — NORGESTIMATE-ETH ESTRADIOL 0.25-35 MG-MCG PO TABS: 1.0000 | ORAL_TABLET | Freq: Every day | ORAL | Status: DC

## 2015-02-04 MED ORDER — TRAMADOL HCL 50 MG PO TABS: 50.0000 mg | ORAL_TABLET | Freq: Four times a day (QID) | ORAL | Status: DC | PRN

## 2015-02-04 NOTE — Patient Instructions (Signed)
Return to clinic for any scheduled appointments or for any gynecologic concerns as needed.   

## 2015-02-04 NOTE — Progress Notes (Signed)
CLINIC ENCOUNTER NOTE  History:  29 y.o. G0P0000 here today for discussion about management of recurrent endometrioma found on recent imaging.  She is s/p laparoscopic LSO for 8 cm tightly adherent endometrioma last year. She denies any current pain, but reports episodes of pain off and on that can get to 10/10 level, radiating throughout lower abdomen.  Takes Naproxen as needed which helps the pain. Denies any nausea, vomiting, fever or other concerns.   Past Medical History  Diagnosis Date  . Sexually transmitted disease (STD)     hx/o trichomonas  . Anxiety   . UTI (urinary tract infection) 2007    hospitalization  . Hyperhydrosis disorder     axilla  . Bacterial vaginosis     history of  . Obesity   . Sickle cell trait   . Endometriosis   . Pelvic inflammatory disease (PID)     Past Surgical History  Procedure Laterality Date  . Laparoscopy N/A 12/25/2013    Procedure: OPERATIVE LAPAROSCOPY;  Surgeon: Osborne Oman, MD;  Location: [facility];  Service: Gynecology;  Laterality: N/A;  . Salpingoophorectomy Left 12/25/2013    Procedure: SALPINGO OOPHORECTOMY;  Surgeon: Osborne Oman, MD;  Location: [facility];  Service: Gynecology;  Laterality: Left;    The following portions of the patient's history were reviewed and updated as appropriate: allergies, current medications, past family history, past medical history, past social history, past surgical history and problem list.   Health Maintenance:  Normal pap on 02/21/14.    Review of Systems:  Pertinent items are noted in HPI. Comprehensive review of systems was otherwise negative.  Objective:  Physical Exam BP 138/78 mmHg  Pulse 83  Temp(Src) 99.1 F (37.3 C) (Oral)  Ht 5' (1.524 m)  Wt 229 lb 12.8 oz (104.237 kg)  BMI 44.88 kg/m2  LMP 01/07/2015 CONSTITUTIONAL: Well-developed, well-nourished female in no acute distress.  HENT:  Normocephalic, atraumatic, External right and left ear normal. Oropharynx is clear and  moist EYES: Conjunctivae and EOM are normal. Pupils are equal, round, and reactive to light. No scleral icterus.  NECK: Normal range of motion, supple, no masses SKIN: Skin is warm and dry. No rash noted. Not diaphoretic. No erythema. No pallor. [place]: Alert and oriented to person, place, and time. Normal reflexes, muscle tone coordination. No cranial nerve deficit noted. PSYCHIATRIC: Normal mood and affect. Normal behavior. Normal judgment and thought content. CARDIOVASCULAR: Normal heart rate noted, regular rhythm RESPIRATORY: Effort and breath sounds normal, no problems with respiration noted ABDOMEN: Soft, no distention noted.  PELVIC: Deferred MUSCULOSKELETAL: Normal range of motion. No edema and no tenderness.  Labs and Imaging US Transvaginal Non-ob  01/30/2015   CLINICAL DATA:  Right-sided pelvic pain for 2 months. History of endometriosis, left salpingoophorectomy  EXAM: TRANSABDOMINAL AND TRANSVAGINAL ULTRASOUND OF PELVIS  TECHNIQUE: Both transabdominal and transvaginal ultrasound examinations of the pelvis were performed. Transabdominal technique was performed for global imaging of the pelvis including uterus, ovaries, adnexal regions, and pelvic cul-de-sac. It was necessary to proceed with endovaginal exam following the transabdominal exam to visualize the endometrium and right ovary, left adnexa.  COMPARISON:  07/21/2014  FINDINGS: Uterus  Measurements: 8.0 x 5.9 x 5.2 cm. Anteverted, anteflexed. Posterior uterine fundal and body contour abnormalities are identified as follows:  Posterior fundal uterine mass, 3.6 x 3.4 x 3.3 cm, partly subserosal.  Posterior uterine fundal intramural probable fibroid, 1.1 x 1.1 x 1.0 cm.  Posterior mid body subserosal probable fibroid, 3.7 x 3.1 x  2.6 cm.  Endometrium  Thickness: 10 mm.  No focal abnormality visualized.  Right ovary  Measurements: 5.2 x 4.9 x 4.1 cm, with complex cystic lesion with ground-glass internal echoes and lobulated contour not  measurable separate from the right ovary.  Left ovary  Measurements: Surgically absent. No adnexal mass  Other findings  No free fluid.  IMPRESSION: Complex right ovarian lesion with imaging characteristics most compatible with endometrioma. Compared to 07/21/2014 and 10/23/2014, this has increased in size. This raises the question of endometrioid carcinoma, although endometriomas may fluctuate in size. Hemorrhagic cyst could appear similar but is less likely. No history of fever or other abnormality to suggest tubo-ovarian abscess, which may also appear similar. Confirmation with pelvic MRI with contrast could be considered as well as for the purposes of possible surgical planning given the presence of probable subserosal fibroids as detailed above.   Electronically Signed   By: Conchita Paris M.D.   On: 01/30/2015 09:51   US Pelvis Complete  01/30/2015   CLINICAL DATA:  Right-sided pelvic pain for 2 months. History of endometriosis, left salpingoophorectomy  EXAM: TRANSABDOMINAL AND TRANSVAGINAL ULTRASOUND OF PELVIS  TECHNIQUE: Both transabdominal and transvaginal ultrasound examinations of the pelvis were performed. Transabdominal technique was performed for global imaging of the pelvis including uterus, ovaries, adnexal regions, and pelvic cul-de-sac. It was necessary to proceed with endovaginal exam following the transabdominal exam to visualize the endometrium and right ovary, left adnexa.  COMPARISON:  07/21/2014  FINDINGS: Uterus  Measurements: 8.0 x 5.9 x 5.2 cm. Anteverted, anteflexed. Posterior uterine fundal and body contour abnormalities are identified as follows:  Posterior fundal uterine mass, 3.6 x 3.4 x 3.3 cm, partly subserosal.  Posterior uterine fundal intramural probable fibroid, 1.1 x 1.1 x 1.0 cm.  Posterior mid body subserosal probable fibroid, 3.7 x 3.1 x 2.6 cm.  Endometrium  Thickness: 10 mm.  No focal abnormality visualized.  Right ovary  Measurements: 5.2 x 4.9 x 4.1 cm, with complex  cystic lesion with ground-glass internal echoes and lobulated contour not measurable separate from the right ovary.  Left ovary  Measurements: Surgically absent. No adnexal mass  Other findings  No free fluid.  IMPRESSION: Complex right ovarian lesion with imaging characteristics most compatible with endometrioma. Compared to 07/21/2014 and 10/23/2014, this has increased in size. This raises the question of endometrioid carcinoma, although endometriomas may fluctuate in size. Hemorrhagic cyst could appear similar but is less likely. No history of fever or other abnormality to suggest tubo-ovarian abscess, which may also appear similar. Confirmation with pelvic MRI with contrast could be considered as well as for the purposes of possible surgical planning given the presence of probable subserosal fibroids as detailed above.   Electronically Signed   By: Conchita Paris M.D.   On: 01/30/2015 09:51    Assessment & Plan:  Discussed recurrent endometrioma with patient.  Patient is worried about her fertility, does not want to lose her only ovary.  Recommended referral to REI specialist, Dr. Kerin Perna for evaluation, management and discussion of fertility in the context of endometriosis. Patient agrees to this plan.  Naproxen and tramadol were prescribed, patient also wants trial of OCPs (declined this in the past) and Sprintec was prescribed.  Pain precautions reviewed.   Total face-to-face time with patient: 25 minutes. Over 50% of encounter was spent on counseling and coordination of care.   Verita Schneiders, MD, [place] Attending [place] for [employer] [place]

## 2015-02-04 NOTE — Progress Notes (Signed)
Referral Intake form completed and to be faxed along with office notes and insurance card to Dr. Charlett Lango office [phone] by front office staff. Advised patient to call clinic if have not heard from office in over 1 week.

## 2015-02-10 ENCOUNTER — Ambulatory Visit (HOSPITAL_COMMUNITY): Payer: 59

## 2015-02-11 ENCOUNTER — Ambulatory Visit: Payer: 59 | Admitting: Obstetrics and Gynecology

## 2015-02-16 ENCOUNTER — Ambulatory Visit: Payer: 59 | Admitting: Obstetrics & Gynecology

## 2015-04-22 ENCOUNTER — Encounter (HOSPITAL_BASED_OUTPATIENT_CLINIC_OR_DEPARTMENT_OTHER): Payer: Self-pay | Admitting: *Deleted

## 2015-04-22 ENCOUNTER — Ambulatory Visit (INDEPENDENT_AMBULATORY_CARE_PROVIDER_SITE_OTHER): Payer: 59 | Admitting: Medical

## 2015-04-22 ENCOUNTER — Encounter: Payer: Self-pay | Admitting: Medical

## 2015-04-22 VITALS — BP 100/70 | HR 68 | Temp 98.5°F | Resp 15 | Wt 230.0 lb

## 2015-04-22 DIAGNOSIS — N941 Dyspareunia: Secondary | ICD-10-CM | POA: Diagnosis not present

## 2015-04-22 DIAGNOSIS — N8320 Unspecified ovarian cysts: Secondary | ICD-10-CM

## 2015-04-22 DIAGNOSIS — N926 Irregular menstruation, unspecified: Secondary | ICD-10-CM

## 2015-04-22 DIAGNOSIS — Z202 Contact with and (suspected) exposure to infections with a predominantly sexual mode of transmission: Secondary | ICD-10-CM | POA: Diagnosis not present

## 2015-04-22 DIAGNOSIS — IMO0002 Reserved for concepts with insufficient information to code with codable children: Secondary | ICD-10-CM

## 2015-04-22 DIAGNOSIS — N83201 Unspecified ovarian cyst, right side: Secondary | ICD-10-CM | POA: Insufficient documentation

## 2015-04-22 LAB — HIV ANTIBODY (ROUTINE TESTING W REFLEX): HIV 1&2 Ab, 4th Generation: NONREACTIVE

## 2015-04-22 NOTE — Progress Notes (Signed)
   Subjective: Here for labs.  Is having surgery Friday to remove ovarian cyst from right ovary. Hx/o similar prior surgery for left ovary.   She notes that periods have been messed for a few months, last period unclear because of breakthrough bleeding.  She has had 2 recent episodes of blood with intercourse.  Stopped OCPs a month ago.  She notes 1 new partner over the past few moths, but not using condoms.  No vaginal discharge.  Does get pain with intercourse some.  She has hx/o endometriosis, which was found on last surgery.  Wants STD labs and pregnancy test today.  No other aggravating or relieving factors. No other complaint.  ROS as in subjective  Past Medical History  Diagnosis Date  . Anxiety   . Sickle cell trait   . Endometriosis   . History of trichomoniasis   . History of pelvic inflammatory disease   . Cyst of right ovary   . History of ovarian cyst     12-25-2013  s/p removal left ovarian endometrioma   Past Surgical History  Procedure Laterality Date  . Laparoscopy N/A 12/25/2013    Procedure: OPERATIVE LAPAROSCOPY;  Surgeon: Osborne Oman, MD;  Location: [facility];  Service: Gynecology;  Laterality: N/A;  . Salpingoophorectomy Left 12/25/2013    Procedure: SALPINGO OOPHORECTOMY;  Surgeon: Osborne Oman, MD;  Location: [facility];  Service: Gynecology;  Laterality: Left;    Objective: BP 100/70 mmHg  Pulse 68  Temp(Src) 98.5 F (36.9 C) (Oral)  Resp 15  Wt 230 lb (104.327 kg)  LMP 04/01/2015 (Approximate)  Gen: wd, wn, nad Skin: unremarkable GU: normal external genitalia, no rash, slight erythema of cervix which is closed, swabs taken, tender somewhat bilat adnexa, surgical port scars noted bilat lower abdomen, uterus seems WNL, nontender, no chandelier sign   Assessment: Encounter Diagnoses  Name Primary?  . Dyspareunia Yes  . Venereal disease contact   . Missed period   . Cyst of right ovary     Plan: Labs today, discussed safe sex, prevention. Frances Hill  was seen today for std check and blood pregnancy test.  Diagnoses and all orders for this visit:  Dyspareunia Orders: -     HIV antibody -     RPR -     GC/Chlamydia Probe Amp -     hCG, quantitative, pregnancy -     POCT Wet Prep [employer])  Venereal disease contact Orders: -     HIV antibody -     RPR -     GC/Chlamydia Probe Amp -     POCT Wet Prep [employer])  Missed period Orders: -     hCG, quantitative, pregnancy -     POCT Wet Prep [employer])  Cyst of right ovary Orders: -     POCT Wet Prep [employer])

## 2015-04-22 NOTE — Progress Notes (Signed)
NPO AFTER MN.  ARRIVE AT 0830.  NEEDS HG AND URINE PREG.  

## 2015-04-23 LAB — HCG, QUANTITATIVE, PREGNANCY: hCG, Beta Chain, Quant, S: <2 m[IU]/mL

## 2015-04-23 LAB — RPR

## 2015-04-23 LAB — GC/CHLAMYDIA PROBE AMP
CT Probe RNA: NEGATIVE
GC Probe RNA: NEGATIVE

## 2015-04-23 NOTE — H&P (Signed)
Frances Hill is a 29 y.o. female , originally referred to me by Dr. Harolyn Rutherford, for endometriosis.  She was diagnosed with uterine fibroids. Her right ovary is enlarged to 5.8 x 5.4 x 3.9 cm,  with same size heterogeneously echogenic cyst,  and shows no significant change from previous ultrasound one month ago.  Antral follicle count is not possible.  Left ovary is surgically absent.  Pertinent Gynecological History: Menses: normal Bleeding: normal Contraception: none DES exposure: denies Blood transfusions: none Sexually transmitted diseases: no past history Previous GYN Procedures: LSO  Last mammogram: normal Last pap: normal  OB History: G 0   Menstrual History: Menarche age: 22 No LMP recorded.    Past Medical History  Diagnosis Date  . Anxiety   . Sickle cell trait   . Endometriosis   . History of trichomoniasis   . History of pelvic inflammatory disease   . Cyst of right ovary   . History of ovarian cyst     12-25-2013  s/p removal left ovarian endometrioma                    Past Surgical History  Procedure Laterality Date  . Laparoscopy N/A 12/25/2013    Procedure: OPERATIVE LAPAROSCOPY;  Surgeon: Osborne Oman, MD;  Location: [facility];  Service: Gynecology;  Laterality: N/A;  . Salpingoophorectomy Left 12/25/2013    Procedure: SALPINGO OOPHORECTOMY;  Surgeon: Osborne Oman, MD;  Location: [facility];  Service: Gynecology;  Laterality: Left;             Family History  Problem Relation Age of Onset  . Aneurysm Mother 39    died of aneurysm  . Cancer Mother 47    breast, twice  . Hypertension Father   . Depression Father   . Heart disease Father     valve disease  . Other Father     DDD  . Cancer Maternal Aunt     breast cancer  . Diabetes Neg Hx   . Stroke Neg Hx   . Other Brother     DDD  . Sarcoidosis Brother    No hereditary disease.  No cancer of breast, ovary, uterus. No cutaneous leiomyomatosis or renal cell carcinoma.  History    Social History  . Marital Status: Single    Spouse Name: N/A  . Number of Children: N/A  . Years of Education: N/A   Occupational History  . Not on file.   Social History Main Topics  . Smoking status: Never Smoker   . Smokeless tobacco: Never Used  . Alcohol Use: Yes     Comment: occasionally  . Drug Use: No  . Sexual Activity: Not on file   Other Topics Concern  . Not on file   Social History Narrative   [name], lives alone, no children, Education officer, museum at [employer], exercise - 3-4 times per week, walks 1-2 miles, weights at [employer], other cardio, diet is good, improved from 2014    No Known Allergies  No current facility-administered medications on file prior to encounter.   Current Outpatient Prescriptions on File Prior to Encounter  Medication Sig Dispense Refill  . ibuprofen (ADVIL,MOTRIN) 800 MG tablet Take 1 tablet (800 mg total) by mouth every 8 (eight) hours as needed. (Patient not taking: Reported on 04/22/2015) 60 tablet 1  . naproxen (NAPROSYN) 500 MG tablet Take 1 tablet (500 mg total) by mouth 2 (two) times daily with a meal. As needed  for pain (Patient not taking: Reported on 04/22/2015) 60 tablet 2  . traMADol (ULTRAM) 50 MG tablet Take 1-2 tablets (50-100 mg total) by mouth every 6 (six) hours as needed. (Patient not taking: Reported on 04/22/2015) 30 tablet 0     Review of Systems  Constitutional: Negative.   HENT: Negative.   Eyes: Negative.   Respiratory: Negative.   Cardiovascular: Negative.   Gastrointestinal: Negative.   Genitourinary: Negative.   Musculoskeletal: Negative.   Skin: Negative.   Neurological: Negative.   Endo/Heme/Allergies: Negative.   Psychiatric/Behavioral: Negative.      Physical Exam  Ht 5\' 6"  (1.676 m)  Wt 103.42 kg (228 lb)  BMI 36.82 kg/m2  LMP 04/01/2015 (Approximate) Constitutional: She is oriented to person, place, and time. She appears well-developed and well-nourished.  HENT:  Head:  Normocephalic and atraumatic.  Nose: Nose normal.  Mouth/Throat: Oropharynx is clear and moist. No oropharyngeal exudate.  Eyes: Conjunctivae normal and EOM are normal. Pupils are equal, round, and reactive to light. No scleral icterus.  Neck: Normal range of motion. Neck supple. No tracheal deviation present. No thyromegaly present.  Cardiovascular: Normal rate.   Respiratory: Effort normal and breath sounds normal.  GI: Soft. Bowel sounds are normal. She exhibits no distension and no mass. There is no tenderness.  Lymphadenopathy:    She has no cervical adenopathy.  Neurological: She is alert and oriented to person, place, and time. She has normal reflexes.  Skin: Skin is warm.  Psychiatric: She has a normal mood and affect. Her behavior is normal. Judgment and thought content normal.       Assessment/Plan:  I had a long discussion with the patient about her lower AMH level, the fact that she has a large endometrioma in her solitary ovary.   I encouraged her to apply for the discount in medications.   I also encouraged her to try Femara plus norethindrone acetate as an alternate way of suppressing endometriosis.  If she follows through, we will do a controlled ovarian stimulation, following cyst aspiration, after she has been on the suppression for one month.   In either case, the patient would like to be scheduled for surgery (laparoscopy, right ovarian cystectomy, excision of individual cyst, gel port assisted myomectomy)

## 2015-04-24 ENCOUNTER — Ambulatory Visit (HOSPITAL_BASED_OUTPATIENT_CLINIC_OR_DEPARTMENT_OTHER): Payer: 59 | Admitting: Anesthesiology

## 2015-04-24 ENCOUNTER — Encounter (HOSPITAL_BASED_OUTPATIENT_CLINIC_OR_DEPARTMENT_OTHER): Payer: Self-pay | Admitting: *Deleted

## 2015-04-24 ENCOUNTER — Encounter (HOSPITAL_COMMUNITY)
Admission: RE | Disposition: A | Discharge status: Home / Self Care | Payer: Self-pay | Source: Ambulatory Visit | Attending: Obstetrics and Gynecology

## 2015-04-24 ENCOUNTER — Ambulatory Visit (HOSPITAL_BASED_OUTPATIENT_CLINIC_OR_DEPARTMENT_OTHER)
Admission: RE | Admit: 2015-04-24 | Discharge: 2015-04-25 | Disposition: A | Discharge status: Home / Self Care | Payer: 59 | Source: Ambulatory Visit | Attending: Obstetrics and Gynecology | Admitting: Obstetrics and Gynecology

## 2015-04-24 DIAGNOSIS — N736 Female pelvic peritoneal adhesions (postinfective): Secondary | ICD-10-CM | POA: Insufficient documentation

## 2015-04-24 DIAGNOSIS — N809 Endometriosis, unspecified: Secondary | ICD-10-CM | POA: Insufficient documentation

## 2015-04-24 DIAGNOSIS — N858 Other specified noninflammatory disorders of uterus: Secondary | ICD-10-CM | POA: Insufficient documentation

## 2015-04-24 DIAGNOSIS — D573 Sickle-cell trait: Secondary | ICD-10-CM | POA: Insufficient documentation

## 2015-04-24 DIAGNOSIS — F419 Anxiety disorder, unspecified: Secondary | ICD-10-CM | POA: Insufficient documentation

## 2015-04-24 DIAGNOSIS — D259 Leiomyoma of uterus, unspecified: Secondary | ICD-10-CM | POA: Insufficient documentation

## 2015-04-24 DIAGNOSIS — N7011 Chronic salpingitis: Secondary | ICD-10-CM | POA: Diagnosis not present

## 2015-04-24 DIAGNOSIS — E669 Obesity, unspecified: Secondary | ICD-10-CM | POA: Insufficient documentation

## 2015-04-24 DIAGNOSIS — Z6836 Body mass index (BMI) 36.0-36.9, adult: Secondary | ICD-10-CM | POA: Insufficient documentation

## 2015-04-24 DIAGNOSIS — N80109 Endometriosis of ovary, unspecified side, unspecified depth: Secondary | ICD-10-CM | POA: Diagnosis present

## 2015-04-24 DIAGNOSIS — N801 Endometriosis of ovary: Secondary | ICD-10-CM | POA: Diagnosis present

## 2015-04-24 HISTORY — DX: Personal history of other infectious and parasitic diseases: Z86.19

## 2015-04-24 HISTORY — DX: Unspecified ovarian cyst, right side: N83.201

## 2015-04-24 HISTORY — DX: Personal history of other diseases of the female genital tract: Z87.42

## 2015-04-24 HISTORY — PX: CHROMOPERTUBATION: SHX6288

## 2015-04-24 HISTORY — PX: LAPAROSCOPIC LYSIS OF ADHESIONS: SHX5905

## 2015-04-24 HISTORY — PX: LAPAROSCOPIC OVARIAN CYSTECTOMY: SHX6248

## 2015-04-24 LAB — POCT PREGNANCY, URINE: Preg Test, Ur: NEGATIVE

## 2015-04-24 SURGERY — EXCISION, CYST, OVARY, LAPAROSCOPIC
Anesthesia: General | Site: Uterus | Laterality: Right

## 2015-04-24 MED ORDER — GLYCOPYRROLATE 0.2 MG/ML IJ SOLN
INTRAMUSCULAR | Status: DC | PRN
  Administered 2015-04-24: 0.4 mg via INTRAVENOUS
  Administered 2015-04-24: 0.2 mg via INTRAVENOUS

## 2015-04-24 MED ORDER — ROCURONIUM BROMIDE 100 MG/10ML IV SOLN
INTRAVENOUS | Status: DC | PRN
  Administered 2015-04-24: 25 mg via INTRAVENOUS
  Administered 2015-04-24 (×2): 10 mg via INTRAVENOUS
  Administered 2015-04-24: 20 mg via INTRAVENOUS
  Administered 2015-04-24 (×2): 10 mg via INTRAVENOUS
  Administered 2015-04-24: 25 mg via INTRAVENOUS

## 2015-04-24 MED ORDER — METHYLENE BLUE 1 % INJ SOLN
INTRAMUSCULAR | Status: DC | PRN
  Administered 2015-04-24: 3 mL

## 2015-04-24 MED ORDER — VASOPRESSIN 20 UNIT/ML IV SOLN
INTRAVENOUS | Status: DC | PRN
  Administered 2015-04-24: 20 [IU]

## 2015-04-24 MED ORDER — PROMETHAZINE HCL 25 MG/ML IJ SOLN
6.2500 mg | INTRAMUSCULAR | Status: DC | PRN
  Administered 2015-04-24: 6.25 mg via INTRAVENOUS
  Filled 2015-04-24: qty 1

## 2015-04-24 MED ORDER — KETOROLAC TROMETHAMINE 30 MG/ML IJ SOLN
30.0000 mg | Freq: Once | INTRAMUSCULAR | Status: DC | PRN
  Filled 2015-04-24: qty 1

## 2015-04-24 MED ORDER — LACTATED RINGERS IR SOLN
Status: DC | PRN
  Administered 2015-04-24: 3000 mL

## 2015-04-24 MED ORDER — HYDROMORPHONE HCL 4 MG PO TABS
4.0000 mg | ORAL_TABLET | Freq: Once | ORAL | Status: AC
  Administered 2015-04-24: 4 mg via ORAL
  Filled 2015-04-24: qty 1

## 2015-04-24 MED ORDER — NEOSTIGMINE METHYLSULFATE 10 MG/10ML IV SOLN
INTRAVENOUS | Status: DC | PRN
  Administered 2015-04-24: 3 mg via INTRAVENOUS

## 2015-04-24 MED ORDER — BUPIVACAINE-EPINEPHRINE 0.25% -1:200000 IJ SOLN
INTRAMUSCULAR | Status: DC | PRN
  Administered 2015-04-24: 20 mL

## 2015-04-24 MED ORDER — OXYCODONE-ACETAMINOPHEN 7.5-325 MG PO TABS: 1.0000 | ORAL_TABLET | ORAL | Status: DC | PRN

## 2015-04-24 MED ORDER — HYDROMORPHONE HCL 2 MG PO TABS
ORAL_TABLET | ORAL | Status: AC
  Filled 2015-04-24: qty 2

## 2015-04-24 MED ORDER — CEFAZOLIN SODIUM-DEXTROSE 2-3 GM-% IV SOLR
2.0000 g | INTRAVENOUS | Status: AC
  Administered 2015-04-24: 2 g via INTRAVENOUS
  Filled 2015-04-24: qty 50

## 2015-04-24 MED ORDER — HYDROMORPHONE HCL 1 MG/ML IJ SOLN
0.2000 mg | INTRAMUSCULAR | Status: DC | PRN
  Administered 2015-04-24: 0.6 mg via INTRAVENOUS
  Filled 2015-04-24: qty 1

## 2015-04-24 MED ORDER — FENTANYL CITRATE (PF) 100 MCG/2ML IJ SOLN
INTRAMUSCULAR | Status: AC
Start: 2015-04-24 — End: 2015-04-24
  Filled 2015-04-24: qty 2

## 2015-04-24 MED ORDER — PROMETHAZINE HCL 25 MG/ML IJ SOLN
INTRAMUSCULAR | Status: AC
  Filled 2015-04-24: qty 1

## 2015-04-24 MED ORDER — KETOROLAC TROMETHAMINE 30 MG/ML IJ SOLN
INTRAMUSCULAR | Status: DC | PRN
  Administered 2015-04-24: 30 mg via INTRAVENOUS

## 2015-04-24 MED ORDER — LIDOCAINE HCL (CARDIAC) 20 MG/ML IV SOLN
INTRAVENOUS | Status: DC | PRN
  Administered 2015-04-24: 60 mg via INTRAVENOUS

## 2015-04-24 MED ORDER — ONDANSETRON HCL 4 MG PO TABS: 4.0000 mg | ORAL_TABLET | Freq: Three times a day (TID) | ORAL | Status: DC | PRN

## 2015-04-24 MED ORDER — LACTATED RINGERS IV SOLN
INTRAVENOUS | Status: DC
  Administered 2015-04-24 (×6): via INTRAVENOUS
  Filled 2015-04-24: qty 1000

## 2015-04-24 MED ORDER — CEFAZOLIN SODIUM-DEXTROSE 2-3 GM-% IV SOLR
INTRAVENOUS | Status: AC
  Filled 2015-04-24: qty 50

## 2015-04-24 MED ORDER — FENTANYL CITRATE (PF) 100 MCG/2ML IJ SOLN
25.0000 ug | INTRAMUSCULAR | Status: DC | PRN
  Administered 2015-04-24 (×2): 50 ug via INTRAVENOUS
  Administered 2015-04-24: 25 ug via INTRAVENOUS
  Filled 2015-04-24: qty 1

## 2015-04-24 MED ORDER — SUCCINYLCHOLINE CHLORIDE 20 MG/ML IJ SOLN
INTRAMUSCULAR | Status: DC | PRN
  Administered 2015-04-24: 100 mg via INTRAVENOUS

## 2015-04-24 MED ORDER — IBUPROFEN 800 MG PO TABS
800.0000 mg | ORAL_TABLET | Freq: Three times a day (TID) | ORAL | Status: DC | PRN
  Administered 2015-04-25: 800 mg via ORAL
  Filled 2015-04-24: qty 1

## 2015-04-24 MED ORDER — OXYCODONE HCL 5 MG PO TABS
2.5000 mg | ORAL_TABLET | Freq: Once | ORAL | Status: AC
  Administered 2015-04-24: 2.5 mg via ORAL
  Filled 2015-04-24: qty 1

## 2015-04-24 MED ORDER — FENTANYL CITRATE (PF) 100 MCG/2ML IJ SOLN
INTRAMUSCULAR | Status: AC
  Filled 2015-04-24: qty 6

## 2015-04-24 MED ORDER — MIDAZOLAM HCL 2 MG/2ML IJ SOLN
INTRAMUSCULAR | Status: AC
  Filled 2015-04-24: qty 2

## 2015-04-24 MED ORDER — FENTANYL CITRATE (PF) 100 MCG/2ML IJ SOLN
INTRAMUSCULAR | Status: AC
  Filled 2015-04-24: qty 2

## 2015-04-24 MED ORDER — SODIUM CHLORIDE 0.9 % IV SOLN
INTRAVENOUS | Status: DC
  Administered 2015-04-24 – 2015-04-25 (×2): via INTRAVENOUS

## 2015-04-24 MED ORDER — OXYCODONE-ACETAMINOPHEN 5-325 MG PO TABS
1.0000 | ORAL_TABLET | ORAL | Status: DC | PRN
  Administered 2015-04-25 (×3): 2 via ORAL
  Filled 2015-04-24 (×3): qty 2

## 2015-04-24 MED ORDER — ONDANSETRON HCL 4 MG/2ML IJ SOLN
INTRAMUSCULAR | Status: DC | PRN
  Administered 2015-04-24: 4 mg via INTRAVENOUS

## 2015-04-24 MED ORDER — ONDANSETRON HCL 4 MG/2ML IJ SOLN
4.0000 mg | Freq: Four times a day (QID) | INTRAMUSCULAR | Status: DC | PRN
  Administered 2015-04-24: 4 mg via INTRAVENOUS
  Filled 2015-04-24: qty 2

## 2015-04-24 MED ORDER — PROPOFOL 10 MG/ML IV BOLUS
INTRAVENOUS | Status: DC | PRN
  Administered 2015-04-24: 200 mg via INTRAVENOUS

## 2015-04-24 MED ORDER — DEXAMETHASONE SODIUM PHOSPHATE 4 MG/ML IJ SOLN
INTRAMUSCULAR | Status: DC | PRN
  Administered 2015-04-24: 10 mg via INTRAVENOUS

## 2015-04-24 MED ORDER — SODIUM CHLORIDE 0.9 % IJ SOLN
INTRAMUSCULAR | Status: DC | PRN
  Administered 2015-04-24: 50 mL

## 2015-04-24 MED ORDER — ONDANSETRON HCL 4 MG PO TABS: 4.0000 mg | ORAL_TABLET | Freq: Four times a day (QID) | ORAL | Status: DC | PRN

## 2015-04-24 MED ORDER — FENTANYL CITRATE (PF) 100 MCG/2ML IJ SOLN
INTRAMUSCULAR | Status: DC | PRN
  Administered 2015-04-24: 25 ug via INTRAVENOUS
  Administered 2015-04-24: 50 ug via INTRAVENOUS
  Administered 2015-04-24: 25 ug via INTRAVENOUS
  Administered 2015-04-24 (×2): 50 ug via INTRAVENOUS
  Administered 2015-04-24: 25 ug via INTRAVENOUS
  Administered 2015-04-24 (×2): 50 ug via INTRAVENOUS
  Administered 2015-04-24: 25 ug via INTRAVENOUS
  Administered 2015-04-24: 50 ug via INTRAVENOUS

## 2015-04-24 MED ORDER — OXYCODONE-ACETAMINOPHEN 5-325 MG PO TABS
1.0000 | ORAL_TABLET | Freq: Once | ORAL | Status: AC
  Administered 2015-04-24: 1 via ORAL
  Filled 2015-04-24: qty 1

## 2015-04-24 MED ORDER — OXYCODONE HCL 5 MG PO TABS
ORAL_TABLET | ORAL | Status: AC
  Filled 2015-04-24: qty 1

## 2015-04-24 MED ORDER — FENTANYL CITRATE (PF) 100 MCG/2ML IJ SOLN
INTRAMUSCULAR | Status: AC
  Filled 2015-04-24: qty 4

## 2015-04-24 MED ORDER — MIDAZOLAM HCL 5 MG/5ML IJ SOLN
INTRAMUSCULAR | Status: DC | PRN
  Administered 2015-04-24: 2 mg via INTRAVENOUS

## 2015-04-24 MED ORDER — OXYCODONE-ACETAMINOPHEN 5-325 MG PO TABS
ORAL_TABLET | ORAL | Status: AC
Start: 2015-04-24 — End: 2015-04-24
  Filled 2015-04-24: qty 1

## 2015-04-24 SURGICAL SUPPLY — 77 items
APPLICATOR COTTON TIP 6IN STRL (MISCELLANEOUS) ×4 IMPLANT
BANDAGE ADH SHEER 1  50/CT (GAUZE/BANDAGES/DRESSINGS) ×4 IMPLANT
BLADE CLIPPER SENSICLIP SURGIC (BLADE) ×4 IMPLANT
BLADE SURG 11 STRL SS (BLADE) ×4 IMPLANT
CATH FOLEY 2WAY SLVR  5CC 14FR (CATHETERS) ×1
CATH FOLEY 2WAY SLVR 5CC 14FR (CATHETERS) ×3 IMPLANT
CATH ROBINSON RED A/P 16FR (CATHETERS) IMPLANT
COVER MAYO STAND STRL (DRAPES) ×4 IMPLANT
DEVICE TROCAR PUNCTURE CLOSURE (ENDOMECHANICALS) IMPLANT
DRAPE UNDERBUTTOCKS STRL (DRAPE) ×4 IMPLANT
DRSG OPSITE POSTOP 3X4 (GAUZE/BANDAGES/DRESSINGS) ×4 IMPLANT
ELECT NEEDLE TIP 2.8 STRL (NEEDLE) IMPLANT
ELECT REM PT RETURN 9FT ADLT (ELECTROSURGICAL) ×4
ELECTRODE REM PT RTRN 9FT ADLT (ELECTROSURGICAL) ×3 IMPLANT
FILTER SMOKE EVAC LAPAROSHD (FILTER) IMPLANT
GLOVE BIO SURGEON STRL SZ 6.5 (GLOVE) ×12 IMPLANT
GLOVE BIO SURGEON STRL SZ7 (GLOVE) ×4 IMPLANT
GLOVE BIOGEL PI IND STRL 8.5 (GLOVE) ×6 IMPLANT
GLOVE BIOGEL PI INDICATOR 8.5 (GLOVE) ×2
GLOVE INDICATOR 6.5 STRL GRN (GLOVE) ×12 IMPLANT
GLOVE INDICATOR 7.0 STRL GRN (GLOVE) ×4 IMPLANT
GOWN STRL REUS W/ TWL LRG LVL3 (GOWN DISPOSABLE) ×12 IMPLANT
GOWN STRL REUS W/TWL LRG LVL3 (GOWN DISPOSABLE) ×4
GOWN STRL REUS W/TWL XL LVL3 (GOWN DISPOSABLE) ×4 IMPLANT
HOLDER FOLEY CATH W/STRAP (MISCELLANEOUS) IMPLANT
IV LACTATED RINGER IRRG 3000ML (IV SOLUTION) ×1
IV LR IRRIG 3000ML ARTHROMATIC (IV SOLUTION) ×3 IMPLANT
LIQUID BAND (GAUZE/BANDAGES/DRESSINGS) IMPLANT
MANIFOLD NEPTUNE II (INSTRUMENTS) IMPLANT
MANIPULATOR UTERINE 4.5 ZUMI (MISCELLANEOUS) ×4 IMPLANT
NEEDLE HYPO 25X1 1.5 SAFETY (NEEDLE) ×4 IMPLANT
NEEDLE INSUFFLATION 14GA 120MM (NEEDLE) ×4 IMPLANT
NEEDLE SPNL 22GX3.5 QUINCKE BK (NEEDLE) ×4 IMPLANT
NEEDLE SPNL 22GX7 QUINCKE BK (NEEDLE) ×4 IMPLANT
NS IRRIG 500ML POUR BTL (IV SOLUTION) ×4 IMPLANT
PACK BASIN DAY SURGERY FS (CUSTOM PROCEDURE TRAY) ×4 IMPLANT
PACK LAPAROSCOPY II (CUSTOM PROCEDURE TRAY) ×4 IMPLANT
PAD OB MATERNITY 4.3X12.25 (PERSONAL CARE ITEMS) ×4 IMPLANT
PADDING ION DISPOSABLE (MISCELLANEOUS) ×4 IMPLANT
PENCIL BUTTON HOLSTER BLD 10FT (ELECTRODE) IMPLANT
POUCH SPECIMEN RETRIEVAL 10MM (ENDOMECHANICALS) IMPLANT
SCALPEL HARMONIC ACE (MISCELLANEOUS) IMPLANT
SEALER TISSUE G2 CVD JAW 35 (ENDOMECHANICALS) IMPLANT
SEALER TISSUE G2 CVD JAW 45CM (ENDOMECHANICALS) IMPLANT
SEPRAFILM MEMBRANE 5X6 (MISCELLANEOUS) ×8 IMPLANT
SET IRRIG TUBING LAPAROSCOPIC (IRRIGATION / IRRIGATOR) ×4 IMPLANT
SOLUTION ANTI FOG 6CC (MISCELLANEOUS) ×4 IMPLANT
SPONGE LAP 4X18 X RAY DECT (DISPOSABLE) ×4 IMPLANT
STOPCOCK 4 WAY LG BORE MALE ST (IV SETS) ×8 IMPLANT
STRIP CLOSURE SKIN 1/2X4 (GAUZE/BANDAGES/DRESSINGS) ×4 IMPLANT
SUT MNCRL AB 4-0 PS2 18 (SUTURE) ×8 IMPLANT
SUT PROLENE 0 CT 1 30 (SUTURE) IMPLANT
SUT VIC AB 2-0 CT1 27 (SUTURE) ×2
SUT VIC AB 2-0 CT1 TAPERPNT 27 (SUTURE) ×6 IMPLANT
SUT VIC AB 2-0 CT2 27 (SUTURE) ×8 IMPLANT
SUT VIC AB 2-0 UR6 27 (SUTURE) IMPLANT
SUT VIC AB 4-0 SH 27 (SUTURE) ×2
SUT VIC AB 4-0 SH 27XANBCTRL (SUTURE) ×6 IMPLANT
SUT VICRYL 0 TIES 12 18 (SUTURE) IMPLANT
SYR 20CC LL (SYRINGE) ×4 IMPLANT
SYR 30ML LL (SYRINGE) IMPLANT
SYR 3ML 23GX1 SAFETY (SYRINGE) ×4 IMPLANT
SYR 5ML LL (SYRINGE) ×4 IMPLANT
SYR CONTROL 10ML LL (SYRINGE) ×8 IMPLANT
SYRINGE 10CC LL (SYRINGE) ×12 IMPLANT
SYRINGE 60CC LL (MISCELLANEOUS) ×4 IMPLANT
SYS LAPSCP GELPORT 120MM (MISCELLANEOUS) ×4
SYSTEM LAPSCP GELPORT 120MM (MISCELLANEOUS) ×3 IMPLANT
TOWEL OR 17X24 6PK STRL BLUE (TOWEL DISPOSABLE) ×8 IMPLANT
TRAY DSU PREP LF (CUSTOM PROCEDURE TRAY) ×4 IMPLANT
TROCAR OPTI TIP 5M 100M (ENDOMECHANICALS) ×8 IMPLANT
TROCAR XCEL DIL TIP R 11M (ENDOMECHANICALS) IMPLANT
TROCAR XCEL OPT SLVE 5M 100M (ENDOMECHANICALS) ×4 IMPLANT
TUBING INSUFFLATION 10FT LAP (TUBING) ×4 IMPLANT
TUBING INSUFFLATION W/FILTER (TUBING) ×4 IMPLANT
WARMER LAPAROSCOPE (MISCELLANEOUS) ×4 IMPLANT
WATER STERILE IRR 500ML POUR (IV SOLUTION) ×4 IMPLANT

## 2015-04-24 NOTE — Progress Notes (Signed)
Dr. Kerin Perna called, rating pain at a 10 in abdomen with burning, see order. Patient can take 2 percocet at home instead of one as ordered on prescription.

## 2015-04-24 NOTE — Progress Notes (Signed)
Reported called to nurse on [place], going to 1524.

## 2015-04-24 NOTE — Transfer of Care (Signed)
Immediate Anesthesia Transfer of Care Note  Patient: Frances Hill  Procedure(s) Performed: Procedure(s) (LRB): LAPAROSCOPIC, RIGHT OVARIAN CYSTECTOMY,  GEL PORT ASSISTED MYOMECTOMY (Right) LAPAROSCOPIC LYSIS OF ADHESIONS AND EXCISION ENDOMETRIOTIC IMPLANTS CHROMOPERTUBATION  Patient Location: PACU  Anesthesia Type: General  Level of Consciousness: awake, oriented, sedated and patient cooperative  Airway & Oxygen Therapy: Patient Spontanous Breathing and Patient connected to face mask oxygen  Post-op Assessment: Report given to PACU RN and Post -op Vital signs reviewed and stable  Post vital signs: Reviewed and stable  Complications: No apparent anesthesia complications

## 2015-04-24 NOTE — H&P (Signed)
History and Physical Interval Note:  04/24/2015 11:24 AM  Frances Hill  has presented today for surgery, with the diagnosis of RIGHT OVARIAN CYST, ENDOMETRIOSIS, URETERINE FIBROIDS  The various methods of treatment have been discussed with the patient and family. After consideration of risks, benefits and other options for treatment, the patient has consented to  Procedure(s): LAPAROSCOPIC, RIGHT OVARIAN CYSTECTOMY, POSSIBLE GEL PORT ASSISTED MYOMECTOMY (Right) as a surgical intervention .  The patient's history has been reviewed, patient examined, no change in status, stable for surgery.  I have reviewed the patient's chart and labs.  Questions were answered to the patient's satisfaction.     Governor Specking

## 2015-04-24 NOTE — Discharge Instructions (Signed)
Post Anesthesia Home Care Instructions  Activity: Get plenty of rest for the remainder of the day. A responsible adult should stay with you for 24 hours following the procedure.  For the next 24 hours, DO NOT: -Drive a car -Paediatric nurse -Drink alcoholic beverages -Take any medication unless instructed by your physician -Make any legal decisions or sign important papers.  Meals: Start with liquid foods such as gelatin or soup. Progress to regular foods as tolerated. Avoid greasy, spicy, heavy foods. If nausea and/or vomiting occur, drink only clear liquids until the nausea and/or vomiting subsides. Call your physician if vomiting continues.  Special Instructions/Symptoms: Your throat may feel dry or sore from the anesthesia or the breathing tube placed in your throat during surgery. If this causes discomfort, gargle with warm salt water. The discomfort should disappear within 24 hours.  If you had a scopolamine patch placed behind your ear for the management of post- operative nausea and/or vomiting:  1. The medication in the patch is effective for 72 hours, after which it should be removed.  Wrap patch in a tissue and discard in the trash. Wash hands thoroughly with soap and water. 2. You may remove the patch earlier than 72 hours if you experience unpleasant side effects which may include dry mouth, dizziness or visual disturbances. 3. Avoid touching the patch. Wash your hands with soap and water after contact with the patch.   Myomectomy, Care After Refer to this sheet in the next few weeks. These instructions provide you with information on caring for yourself after your procedure. Your health care provider may also give you more specific instructions. Your treatment has been planned according to current medical practices, but problems sometimes occur. Call your health care provider if you have any problems or questions after your procedure. WHAT TO EXPECT AFTER THE  PROCEDURE After your procedure, it is typical to have the following:  Pain in your abdomen, especially at any incision sites. You will be given pain medicine to control the pain.  Tiredness. This is a normal part of the recovery process. Your energy level will return to normal over the next several weeks.  Constipation.  Vaginal bleeding. This is normal and should stop after 1-2 weeks. HOME CARE INSTRUCTIONS   Only take over-the-counter or prescription medicines as directed by your health care provider. Avoid aspirin because it can cause bleeding.  Do not douche, use tampons, or have sexual intercourse until given permission by your health care provider.  Remove or change any bandages (dressings) as directed by your health care provider.  Take showers instead of baths as directed by your health care provider.  You will probably be able to go back to your normal routine after a few days. Do not do anything that requires extra effort until your health care provider says it is okay. Do not lift anything heavier than 15 pounds (6.8 kg) until your health care provider approves.  Walk daily but take frequent rest breaks if you tire easily.  Continue to practice deep breathing and coughing. If it hurts to cough, try holding a pillow against your belly as you cough.  If you become constipated, you may:  Use a mild laxative if your health care provider approves.  Add more fruit and bran to your diet.  Drink enough fluids to keep your urine clear or pale yellow.  Take your temperature twice a day and write it down.  Do not drink alcohol.  Do not drive until your health  care provider approves.  Have someone help you at home for 1 week or until you can do your own household activities.  Follow up with your health care provider as directed. SEEK MEDICAL CARE IF:  You have a fever.  You have increasing abdominal pain that is not relieved with medicine.  You have nausea, vomiting,  or diarrhea.  You have pain when you urinate, or you have blood in your urine.  You have a rash on your body.  You have pain or redness where your IV access tube was inserted.  You have redness, swelling, or any kind of drainage from an incision. SEEK IMMEDIATE MEDICAL CARE IF:   You have weakness or lightheadedness.  You have pain, swelling, or redness in your legs.  You have chest pain.  You faint.  You have shortness of breath.  You have heavy vaginal bleeding.  Your incision is opening up. Document Released: 02/09/2011 Document Revised: 07/10/2013 Document Reviewed: 05/01/2013 [facility] Patient Information 2015 [place], [place]. This information is not intended to replace advice given to you by your health care provider. Make sure you discuss any questions you have with your health care provider.

## 2015-04-24 NOTE — Progress Notes (Signed)
Dr. Kerin Perna called patient still rating pain at a 10 and wanting to be admitted. Bed requested and waiting on orders.

## 2015-04-24 NOTE — Anesthesia Procedure Notes (Signed)
Procedure Name: Intubation Date/Time: 04/24/2015 11:02 AM Performed by: Denna Haggard D Pre-anesthesia Checklist: Patient identified, Emergency Drugs available, Suction available and Patient being monitored Patient Re-evaluated:Patient Re-evaluated prior to inductionOxygen Delivery Method: Circle System Utilized Preoxygenation: Pre-oxygenation with 100% oxygen Intubation Type: IV induction Ventilation: Mask ventilation without difficulty Laryngoscope Size: Mac and 3 Grade View: Grade I Tube type: Oral Tube size: 7.0 mm Number of attempts: 1 Airway Equipment and Method: Stylet and Oral airway Placement Confirmation: ETT inserted through vocal cords under direct vision,  positive ETCO2 and breath sounds checked- equal and bilateral Secured at: 21 cm Tube secured with: Tape Dental Injury: Teeth and Oropharynx as per pre-operative assessment

## 2015-04-24 NOTE — Progress Notes (Signed)
Transferred via wheelchair to 1524 on [place] and friend at bedside.

## 2015-04-24 NOTE — Anesthesia Postprocedure Evaluation (Signed)
  Anesthesia Post-op Note  Patient: Frances Hill  Procedure(s) Performed: Procedure(s) (LRB): LAPAROSCOPIC, RIGHT OVARIAN CYSTECTOMY,  GEL PORT ASSISTED MYOMECTOMY (Right) LAPAROSCOPIC LYSIS OF ADHESIONS AND EXCISION ENDOMETRIOTIC IMPLANTS CHROMOPERTUBATION  Patient Location: PACU  Anesthesia Type: General  Level of Consciousness: awake and alert   Airway and Oxygen Therapy: Patient Spontanous Breathing  Post-op Pain: mild  Post-op Assessment: Post-op Vital signs reviewed, Patient's Cardiovascular Status Stable, Respiratory Function Stable, Patent Airway and No signs of Nausea or vomiting  Last Vitals:  Filed Vitals:   04/24/15 1615  BP: 140/77  Pulse: 61  Temp:   Resp: 17    Post-op Vital Signs: stable   Complications: No apparent anesthesia complications

## 2015-04-24 NOTE — Op Note (Signed)
Operative Note  Preoperative diagnosis: Right ovarian complex mass, uterine myomas  Postoperative diagnosis: Right ovarian 5 cm endometrioma, stage IV endometriosis of the right ovary, right tube, posterior cul-de-sac and uterus, uterine leiomyomas, intramural and subserosal, pelvic adhesions  Procedure: Laparoscopy, lysis of adhesions, electrosurgical excision of peritoneal lesions, right ovarian cystectomy, GelPort assisted myomectomy, chromotubation  Anesthesia: Gen. endotracheal  Complications: None  Estimated blood loss: 200 mL  Specimens: Uterine and right ovarian adhesions, peritoneal biopsies, uterine leiomyomas, right ovarian cyst wall to pathology  Findings: On exam under anesthesia, external genitalia, Bartholin's, Skene's, and urethra were normal. The vagina was normal. The cervix appeared grossly normal. The uterus sounded to 12 cm. The uterine corpus was multinodular and was enlarged to 9-10 weeks size. There was a distinct posterior cul-de-sac firm mass, which turned out to be is pedunculated myoma. There were no other cul-de-sac nodularities or induration. Right adnexal fullness was also noted.  On laparoscopy, upper abdomen, liver surface and diaphragm surfaces were normal. Gallbladder was normal. The appendix was normal. The pelvic peritoneum looked mostly normal.  Anterior cul-de-sac peritoneum contained a red blebs of endometriosis on the left side as well as brown lesions, this area was excised. There were anterior uterine intramural and subserosal myomas about 1 cm. There was a fundal 1 x 1 cm myoma. There were fundal and posterior transmural myoma that measures 3 x 2 cm. A partial obliteration of the posterior cul-de-sac was noted, with attachment of the rectosigmoid to the site where a previous left salpingo-oophorectomy had been performed. After lysis of these adhesions, we found filmy adhesions covering the cul-de-sac. Underneath was a 4 x 4 centimeter pedunculated myoma  that was wedged into the posterior cul-de-sac. The right ovary enlarged with a 5 x 5 cm endometrioma as well as a corpus luteum overlying its. The ovary was adherent with 30% of its surface area to the posterior aspect of the uterus and to the right tube. The right tube had filmy adhesions covering 50% of its distal portion, even though the fimbria could be separated from these adhesions and were rated as 5 out of 5. The right tube was patent to chromotubation. There were no other peritoneal lesions of endometriosis in the posterior cul-de-sac.-  Description of the procedure: The patient was placed in dorsal supine position and general endotracheal anesthesia was given. 2 g of cefazolin were given intravenously for prophylaxis. Patient was placed in lithotomy position. She was prepped and draped inside manner.  A Foley was inserted into the bladder. . A ZUMI catheter was placed into the uterine cavity. The surgeon was regloved and a surgical field was created on the abdomen.  After preemptive anesthesia of all surgical sites with 0.25% bupivacaine with 1 200,000 epinephrine, a 5 mm intraumbilical skin incision was made and a Verress needle was inserted. Its correct location was confirmed. A pneumoperitoneum was created with carbon dioxide.  5 mm laparoscope with a 30 lens was inserted and video laparoscopy was started . A left lower quadrant 5 mm and a right lower quadrant  5 mm incisions were made and ancillary trochars were placed under direct visualization. Above findings were noted.   Using a needle electrode on 89 W of cutting current, extensive lysis of adhesions and enteroclysis was carried out, separating the rectosigmoid from the left salpingo-oophorectomy site. Meticulous Tesio lysis was able to uncover the large posterior pedunculated myoma that was wedged into the posterior cul-de-sac. This myoma was grasped with a tenaculum and Done with firm  traction so that the adhesions between the myoma and  the right tube and between the right tube and the right ovary could be better defined. These were meticulously lysed. Chromotubation was performed and the right tubal patency was confirmed. Next we performed a right ovariolysis, again with needle electrode. The medial aspect of the cyst was opened with needle electrode and large amount of thick brown endometriotic cyst fluid was aspirated and the cyst was copiously irrigated and the aspirated. The cyst wall was well defined such that we could do a careful cystectomy without removing any normal ovarian tissue. Only a small portion of the cyst wall (20%) could not be separated and this portion was superficially ablated with needle electrode. Adequate hemostasis was achieved with pinpoint coagulation on the needle electrode.   Next we performed a myomectomy by coagulating and cutting the stalk of the posterior cul-de-sac pedunculated myoma using the needle tip electrode.  At this point we decided to make a 3 cm wide transverse suprapubic incision to insert a GelPort. Incision was made after preemptive anesthesia of the incision site. After dissection of the anatomic layers, the peritoneal cavity was entered and the GelPort was placed. The   Th myoma specimen was removed from this port. Next turning it into a open laparotomy access the rest of the fundal intramural and subserosal myomas were removed by cutting the overlying myometrium following vasopressin 0.33 units per mL injection into the myometrium and dissecting the myoma with blunt and sharp dissection. A total of 6 myomas ranging in size from 1 cm to 4 cm were removed and sent to pathology. The myoma defects were closed with 2-0 Vicryl in 2 layers: Deep myometrial interlocking continuous suture, and superficial myometrial continuous suture. 4-0 Vicryl continuous suture was placed on the serosal aspect of the incisions (total of 2 incisions were made, not counting the pedunculated myoma removal  sites. Adequate hemostasis was obtained. We resumed to laparoscopy and copiously irrigated and aspirated the pelvis. A slurry of 2 sheets of Seprafilm in 60 mL of lactated Ringer's was instilled into the pelvis to try to reduce the adhesion formation. The gas was allowed to escape. The ports were removed. The fascia of the transverse incision was closed with 2-0 Vicryl continuous suture. Subcutaneous hemostasis was insured. The rest of the skin incisions were approximated with 4-0 Monocryl in subcuticular stitches. Steri-Strips were applied.  The patient tolerated the procedure well and was transferred to recovery room in satisfactory condition.  SPECIAL NOTE: Because of the extent of the myometrial incision for the removal of intramural myomas, patient is recommended to have cesarean delivery with future pregnancies.  Governor Specking

## 2015-04-24 NOTE — Anesthesia Preprocedure Evaluation (Signed)
Anesthesia Evaluation  Patient identified by MRN, date of birth, ID band Patient awake    Reviewed: Allergy & Precautions, NPO status , Patient's Chart, lab work & pertinent test results  Airway Mallampati: II  TM Distance: <3 FB Neck ROM: Full    Dental no notable dental hx.    Pulmonary neg pulmonary ROS,  breath sounds clear to auscultation  Pulmonary exam normal       Cardiovascular negative cardio ROS Normal cardiovascular examRhythm:Regular Rate:Normal     Neuro/Psych Anxiety negative neurological ROS     GI/Hepatic negative GI ROS, Neg liver ROS,   Endo/Other  obesity  Renal/GU negative Renal ROS  negative genitourinary   Musculoskeletal negative musculoskeletal ROS (+)   Abdominal   Peds negative pediatric ROS (+)  Hematology Sickle cell trait   Anesthesia Other Findings   Reproductive/Obstetrics negative OB ROS                             Anesthesia Physical Anesthesia Plan  ASA: II  Anesthesia Plan: General   Post-op Pain Management:    Induction: Intravenous  Airway Management Planned: Oral ETT  Additional Equipment:   Intra-op Plan:   Post-operative Plan: Extubation in OR  Informed Consent: I have reviewed the patients History and Physical, chart, labs and discussed the procedure including the risks, benefits and alternatives for the proposed anesthesia with the patient or authorized representative who has indicated his/her understanding and acceptance.   Dental advisory given  Plan Discussed with: CRNA and Surgeon  Anesthesia Plan Comments:         Anesthesia Quick Evaluation

## 2015-04-25 DIAGNOSIS — N7011 Chronic salpingitis: Secondary | ICD-10-CM | POA: Diagnosis not present

## 2015-04-25 LAB — CBC
HCT: 32.8 % — ABNORMAL LOW (ref 36.0–46.0)
Hemoglobin: 10.9 g/dL — ABNORMAL LOW (ref 12.0–15.0)
MCH: 25 pg — ABNORMAL LOW (ref 26.0–34.0)
MCHC: 33.2 g/dL (ref 30.0–36.0)
MCV: 75.2 fL — ABNORMAL LOW (ref 78.0–100.0)
Platelets: 256 10*3/uL (ref 150–400)
RBC: 4.36 MIL/uL (ref 3.87–5.11)
RDW: 14.8 % (ref 11.5–15.5)
WBC: 11.2 10*3/uL — ABNORMAL HIGH (ref 4.0–10.5)

## 2015-04-25 NOTE — Progress Notes (Signed)
Pt discharged to home with father.  Pt verbalized understanding of discharge instructions and follow up care.  All belongings sent home with family and patient.  Pt A/O x4 upon discharge and pain is controlled with oral pain medications.  Education provided re: diet, incisional care and pain management.  Danton Clap, RN

## 2015-04-25 NOTE — Progress Notes (Signed)
Message left to the office of doctor [place] .  Covering MD to return call with new order to advance patient's diet.

## 2015-04-27 ENCOUNTER — Encounter (HOSPITAL_BASED_OUTPATIENT_CLINIC_OR_DEPARTMENT_OTHER): Payer: Self-pay | Admitting: Obstetrics and Gynecology

## 2015-04-30 LAB — POCT HEMOGLOBIN-HEMACUE: Hemoglobin: 13.1 g/dL (ref 12.0–15.0)

## 2015-05-15 ENCOUNTER — Encounter: Payer: Self-pay | Admitting: Medical

## 2015-05-15 ENCOUNTER — Telehealth: Payer: Self-pay | Admitting: Medical

## 2015-05-15 ENCOUNTER — Ambulatory Visit (INDEPENDENT_AMBULATORY_CARE_PROVIDER_SITE_OTHER): Payer: 59 | Admitting: Medical

## 2015-05-15 VITALS — BP 130/80 | HR 64 | Temp 98.4°F | Resp 18 | Ht 65.0 in | Wt 226.4 lb

## 2015-05-15 DIAGNOSIS — R110 Nausea: Secondary | ICD-10-CM

## 2015-05-15 DIAGNOSIS — Z202 Contact with and (suspected) exposure to infections with a predominantly sexual mode of transmission: Secondary | ICD-10-CM

## 2015-05-15 DIAGNOSIS — N6452 Nipple discharge: Secondary | ICD-10-CM | POA: Diagnosis not present

## 2015-05-15 DIAGNOSIS — R109 Unspecified abdominal pain: Secondary | ICD-10-CM | POA: Diagnosis not present

## 2015-05-15 DIAGNOSIS — N898 Other specified noninflammatory disorders of vagina: Secondary | ICD-10-CM

## 2015-05-15 LAB — POCT URINALYSIS DIPSTICK
Bilirubin, UA: NEGATIVE
Glucose, UA: NEGATIVE
Ketones, UA: NEGATIVE
Leukocytes, UA: NEGATIVE
Nitrite, UA: NEGATIVE
Protein, UA: NEGATIVE
Spec Grav, UA: 1.02
Urobilinogen, UA: NEGATIVE
pH, UA: 6

## 2015-05-15 LAB — CBC
HCT: 39.7 % (ref 36.0–46.0)
Hemoglobin: 12.7 g/dL (ref 12.0–15.0)
MCH: 24.8 pg — ABNORMAL LOW (ref 26.0–34.0)
MCHC: 32 g/dL (ref 30.0–36.0)
MCV: 77.4 fL — ABNORMAL LOW (ref 78.0–100.0)
MPV: 10.4 fL (ref 8.6–12.4)
Platelets: 358 10*3/uL (ref 150–400)
RBC: 5.13 MIL/uL — ABNORMAL HIGH (ref 3.87–5.11)
RDW: 15.5 % (ref 11.5–15.5)
WBC: 5.9 10*3/uL (ref 4.0–10.5)

## 2015-05-15 LAB — POCT URINE PREGNANCY: Preg Test, Ur: NEGATIVE

## 2015-05-15 MED ORDER — FLUCONAZOLE 150 MG PO TABS: ORAL_TABLET | ORAL | Status: DC

## 2015-05-15 NOTE — Progress Notes (Signed)
Subjective: Chief Complaint  Patient presents with  . Nausea    abdominal pain, breast leaking(yellow discharge). Has irregular periods. Also requesting STD check today.    Here for concerns today.  Had gynecological surgery since last visit on 04/24/15.  Was admitted for a day, had a lot of pain.  Ended up having cyst taken off ovary, had adhesions separates, fibroids removed and endometriosis tissue removed.   Still having some pain but improved.  What brought her in today is yellowish fluid leaking from breast that started a few days ago.   Breasts are not tender.  Feels like period wants to start.  Was having cramping and spotting since the surgery.  Not on birth control.  Has creamy vaginal discharge.   Having some headaches.   Went to Dr. Cipriano Bunker yesterday but apparently didn't discuss any of this.   Surgical wounds healing ok, but quite tender, stomach seems swollen.  No new sexual partners.  Despite having normal STD screen 7/20, she demands repeat screen today.   No other aggravating or relieving factors. No other complaint.  Past Medical History  Diagnosis Date  . Anxiety   . Sickle cell trait   . Endometriosis   . History of trichomoniasis   . History of pelvic inflammatory disease   . Cyst of right ovary   . History of ovarian cyst     12-25-2013  s/p removal left ovarian endometrioma   ROS as in subjective   Objective: BP 130/80 mmHg  Pulse 64  Temp(Src) 98.4 F (36.9 C) (Tympanic)  Resp 18  Ht 5\' 5"  (1.651 m)  Wt 226 lb 6.4 oz (102.694 kg)  BMI 37.67 kg/m2  LMP 04/01/2015 (Approximate)  Gen: wd, wn,nad Breast: no mass, no rash, no discharge, no abnormality, no axillary and supraclavicular lymphadenopathy.  Exam chaperoned by nurse Abdomen: mild lower central tenderness, there are port surgical scars and low transverse surgical scars, otherwise mild generalized tenderness, no mass, no lymphoadenopathy Back non tender Heart RRR normal s1, s2, no murmurs Lungs  clear Ext: no edema Pulses normal CN2-12 intact     Assessment: Encounter Diagnoses  Name Primary?  . Abdominal pain, unspecified abdominal location Yes  . Nausea without vomiting   . Breast discharge   . Vaginal discharge   . Venereal disease contact     Plan: discussed her concerns.  She did a self wet prep today which showed yeasts and no clue cells or trich or other.   Begin diflucan.   I called and spoke to her gynecologist's office.  They were not worried about the symptoms.  Reassured her and to give more time as she would be expected to have soreness in the lower abdomen today given the recent proximity of her surgery.   She insisted on STD labs although I didn't think they were needed at this time.   F/u pending labs, and the gynecologist office will call her in follow up as well.

## 2015-05-15 NOTE — Telephone Encounter (Signed)
I spoke to Dr. Corky Sing office.  They didn't seem to worried about her collection of symptoms either.  We will call with lab results.  Start the Diflucan for yeast, and their office said they would be calling to check up on her.

## 2015-05-15 NOTE — Telephone Encounter (Signed)
Patient advised and verbalized understanding 

## 2015-05-16 LAB — PROLACTIN: Prolactin: 12 ng/mL

## 2015-05-16 LAB — GC/CHLAMYDIA PROBE AMP
CT Probe RNA: NEGATIVE
GC Probe RNA: NEGATIVE

## 2015-05-16 LAB — HIV ANTIBODY (ROUTINE TESTING W REFLEX): HIV 1&2 Ab, 4th Generation: NONREACTIVE

## 2015-05-16 LAB — RPR

## 2015-06-10 ENCOUNTER — Ambulatory Visit (HOSPITAL_COMMUNITY)
Admission: RE | Admit: 2015-06-10 | Discharge: 2015-06-10 | Disposition: A | Discharge status: Home / Self Care | Payer: 59 | Source: Ambulatory Visit | Attending: Obstetrics and Gynecology | Admitting: Obstetrics and Gynecology

## 2015-06-10 ENCOUNTER — Other Ambulatory Visit (HOSPITAL_COMMUNITY)
Admission: RE | Admit: 2015-06-10 | Discharge: 2015-06-10 | Disposition: A | Discharge status: Home / Self Care | Payer: 59 | Source: Ambulatory Visit

## 2015-06-10 ENCOUNTER — Other Ambulatory Visit (HOSPITAL_COMMUNITY): Payer: Self-pay | Admitting: Obstetrics and Gynecology

## 2015-06-10 DIAGNOSIS — D259 Leiomyoma of uterus, unspecified: Secondary | ICD-10-CM | POA: Insufficient documentation

## 2015-06-10 DIAGNOSIS — K651 Peritoneal abscess: Secondary | ICD-10-CM | POA: Insufficient documentation

## 2015-06-10 DIAGNOSIS — N809 Endometriosis, unspecified: Secondary | ICD-10-CM

## 2015-06-10 DIAGNOSIS — R102 Pelvic and perineal pain: Secondary | ICD-10-CM

## 2015-06-10 LAB — PREGNANCY, URINE: Preg Test, Ur: NEGATIVE

## 2015-06-10 MED ORDER — IOHEXOL 300 MG/ML  SOLN
100.0000 mL | Freq: Once | INTRAMUSCULAR | Status: AC | PRN
  Administered 2015-06-10: 100 mL via INTRAVENOUS

## 2015-07-15 ENCOUNTER — Encounter: Payer: Self-pay | Admitting: Medical

## 2015-07-15 ENCOUNTER — Ambulatory Visit (INDEPENDENT_AMBULATORY_CARE_PROVIDER_SITE_OTHER): Payer: 59 | Admitting: Medical

## 2015-07-15 VITALS — BP 112/70 | HR 76 | Temp 102.1°F | Resp 14 | Ht 67.0 in | Wt 228.0 lb

## 2015-07-15 DIAGNOSIS — J029 Acute pharyngitis, unspecified: Secondary | ICD-10-CM | POA: Diagnosis not present

## 2015-07-15 DIAGNOSIS — J3489 Other specified disorders of nose and nasal sinuses: Secondary | ICD-10-CM

## 2015-07-15 DIAGNOSIS — R52 Pain, unspecified: Secondary | ICD-10-CM | POA: Diagnosis not present

## 2015-07-15 DIAGNOSIS — R509 Fever, unspecified: Secondary | ICD-10-CM

## 2015-07-15 MED ORDER — AMOXICILLIN 500 MG PO TABS: ORAL_TABLET | ORAL | Status: DC

## 2015-07-15 NOTE — Progress Notes (Signed)
Subjective:  Frances Hill is a 29 y.o. female who presents for illness. Has had ongoing head pressure for 2 wk, but in the last 2 days had more abrupt onset of bad sore throat, ear pain, body aches, chills fever to 100.8 this morning that is now 102.    Denies chills and nausea, vomiting, diarrhea, cough, wheezing, urinary symptoms.. Using ibuprofen and left over tramadol from prior visit for symptoms. reports sick contacts, supposedly one coworker had flu like symptoms recently No other aggravating or relieving factors.  No other c/o.  Past Medical History  Diagnosis Date  . Anxiety   . Sickle cell trait ([place])   . Endometriosis   . History of trichomoniasis   . History of pelvic inflammatory disease   . Cyst of right ovary   . History of ovarian cyst     12-25-2013  s/p removal left ovarian endometrioma    ROS as in subjective   Objective: BP 112/70 mmHg  Pulse 76  Temp(Src) 102.1 F (38.9 C) (Oral)  Resp 14  Ht 5\' 7"  (1.702 m)  Wt 228 lb (103.42 kg)  BMI 35.70 kg/m2  SpO2 96%  General appearance: Alert, WD/WN, ill appearing, lying on exam table                             Skin: warm, no rash                           Head: + maxillary sinus tenderness,                            Eyes: conjunctiva normal, corneas clear, PERRLA                            Ears: flatTMs, external ear canals normal                          Nose: septum midline, turbinates swollen, with erythema and clear discharge             Mouth/throat: MMM, tongue normal, mild pharyngeal erythema                           Neck: supple, no adenopathy, no thyromegaly, non tender                          Heart: RRR, normal S1, S2, no murmurs                         Lungs: CTA bilaterally, no wheezes, rales, or rhonchi      Assessment  Encounter Diagnoses  Name Primary?  . Fever, unspecified fever cause Yes  . Sore throat   . Body aches   . Sinus pressure       Plan: Flu and strep swabs  negative.  Given time frame and symptoms, will treat for sinusitis.  Specific home care recommendations today include:  Only take over-the-counter (OTC) or prescription medicines for pain, discomfort, or fever as directed by your caregiver.    Decongestant: You may use OTC Guaifenesin (Mucinex plain) for congestion.  You may use Pseudoephedrine (Sudafed) only if you don't have blood pressure problems  or a diagnosis of hypertension.  Cough suppression: If you have cough from drainage, you may use over-the-counter Dextromethorphan (Delsym) as directed on the label  Pain/fever relief: You may use over-the-counter Tylenol for pain or fever  Drink extra fluids. Fluids help thin the mucus so your sinuses can drain more easily.   Applying either moist heat or ice packs to the sinus areas may help relieve discomfort.  Use saline nasal sprays to help moisten your sinuses. The sprays can be found at your local drugstore.   Prescription (s) given:amoxicillin  [name] was seen today for sinus problem and ear pain.  Diagnoses and all orders for this visit:  Fever, unspecified fever cause  Sore throat  Body aches  Sinus pressure   Patient was advised to call or return if worse or not improving in the next few days.    Patient voiced understanding of diagnosis, recommendations, and treatment plan.  After visit summary given.

## 2015-07-15 NOTE — Patient Instructions (Signed)
Thank you for giving me the opportunity to serve you today.   Your diagnosis today includes: Encounter Diagnoses  Name Primary?  . Fever, unspecified fever cause Yes  . Sore throat   . Body aches   . Sinus pressure     Medications prescribed today:   Specific home care recommendations today include:  Only take over-the-counter (OTC) or prescription medicines for pain, discomfort, or fever as directed by your caregiver.    Decongestant: You may use OTC Guaifenesin (Mucinex plain) for congestion.  You may use Pseudoephedrine (Sudafed) only if you don't have blood pressure problems or a diagnosis of hypertension.  Cough suppression: If you have cough from drainage, you may use over-the-counter Dextromethorphan (Delsym) as directed on the label  Pain/fever relief: You may use over-the-counter Tylenol for pain or fever  Drink extra fluids. Fluids help thin the mucus so your sinuses can drain more easily.   Applying either moist heat or ice packs to the sinus areas may help relieve discomfort.  Use saline nasal sprays to help moisten your sinuses. The sprays can be found at your local drugstore.   Please call or return if worse or not improving in the next few days.    Medication costs:  If you get to the pharmacy and medication prescribed today was either too expensive, not covered by your insurance, or required prior authorization, then please call us back to let us know.  We often have no way to know if a medication is too expensive or not covered by your insurance.  Thanks for your cooperation.   Return if symptoms worsen or fail to improve.    I have included other useful information below for your review.      Sinusitis Sinuses are air pockets within the bones of your face. The growth of bacteria within a sinus leads to infection. Infection keeps the sinuses from draining. This infection is called sinusitis. SYMPTOMS There will be different areas of pain depending on  which sinuses have become infected.  The maxillary sinuses often produce pain beneath the eyes.   Frontal sinusitis may cause pain in the middle of the forehead and above the eyes.  Other problems (symptoms) include: 1. Toothaches.  2. Colored, pus-like (purulent) drainage from the nose.  3. Any swelling, warmth, or tenderness over the sinus areas may be signs of infection.  TREATMENT Sinusitis is most often determined by an exam and you may have x-rays taken. If x-rays have been taken, make sure you obtain your results. Or find out how you are to obtain them. Your caregiver may give you medications (antibiotics). These are medications that will help kill the infection. You may also be given a medication (decongestant) that helps to reduce sinus swelling.  HOME CARE INSTRUCTIONS  Only take over-the-counter or prescription medicines for pain, discomfort, or fever as directed by your caregiver.   Drink extra fluids. Fluids help thin the mucus so your sinuses can drain more easily.   Applying either moist heat or ice packs to the sinus areas may help relieve discomfort.   Use saline nasal sprays to help moisten your sinuses. The sprays can be found at your local drugstore.  SEEK IMMEDIATE MEDICAL CARE IF YOU DEVELOP:  High fever that is still present after two days of antibiotic treatment.   Increasing pain, severe headaches, or toothache.   Nausea, vomiting, or drowsiness.   Unusual swelling around the face or trouble seeing.  MAKE SURE YOU:   Understand these  instructions.   Will watch your condition.   Will get help right away if you are not doing well or get worse.  Document Released: 09/19/2005 Document Re-Released: 09/01/2008 [facility] Patient Information 2011 [place].

## 2015-08-04 ENCOUNTER — Encounter: Payer: Self-pay | Admitting: Family Medicine

## 2015-08-04 ENCOUNTER — Ambulatory Visit (INDEPENDENT_AMBULATORY_CARE_PROVIDER_SITE_OTHER): Payer: 59 | Admitting: Family Medicine

## 2015-08-04 VITALS — BP 122/74 | HR 64 | Temp 98.3°F | Wt 226.4 lb

## 2015-08-04 DIAGNOSIS — R110 Nausea: Secondary | ICD-10-CM | POA: Diagnosis not present

## 2015-08-04 DIAGNOSIS — R102 Pelvic and perineal pain: Secondary | ICD-10-CM

## 2015-08-04 LAB — POCT URINALYSIS DIPSTICK
Bilirubin, UA: NEGATIVE
Blood, UA: NEGATIVE
Glucose, UA: NEGATIVE
Ketones, UA: NEGATIVE
Leukocytes, UA: NEGATIVE
Nitrite, UA: NEGATIVE
Protein, UA: NEGATIVE
Spec Grav, UA: >=1.03
Urobilinogen, UA: NEGATIVE
pH, UA: 6

## 2015-08-04 LAB — POCT URINE PREGNANCY: Preg Test, Ur: NEGATIVE

## 2015-08-04 MED ORDER — ONDANSETRON HCL 4 MG PO TABS: 4.0000 mg | ORAL_TABLET | Freq: Three times a day (TID) | ORAL | Status: DC | PRN

## 2015-08-04 NOTE — Progress Notes (Signed)
   Subjective:    Patient ID: Frances Hill, female    DOB: 10-19-1985, 29 y.o.   MRN: [id number]  HPI She is here with a 3 day history of pelvic pain that she describes as constant, bloated and sore but sometimes sharp. She states she is also spotting and thinks she could be pregnant. States she is having unprotected sex and would be okay if she does become pregnant. Complains of dyspareunia. States she last had intercourse 2 days ago and had significant pain.  Also reports nausea. Denies fever, chills, vaginal irritation, vaginal discharge.  Last bowel movement today and normal per patient, no blood or pus.  She has an appointment with her OB/GYN in December. Reports history of ovarian cyst, endometriosis and scar tissue. She has not taken anything for pain but states she does have tramadol at home.  Reviewed allergies, medications, past medical and surgical history.  Past Medical History  Diagnosis Date  . Anxiety   . Sickle cell trait ([place])   . Endometriosis   . History of trichomoniasis   . History of pelvic inflammatory disease   . Cyst of right ovary   . History of ovarian cyst     12-25-2013  s/p removal left ovarian endometrioma    Review of Systems Pertinent positives and negatives in the history of present illness.    Objective:   Physical Exam  Constitutional: She is oriented to person, place, and time. She appears well-developed and well-nourished. No distress.  Abdominal: Soft. Bowel sounds are normal. She exhibits no distension. There is no hepatosplenomegaly. There is tenderness in the suprapubic area. There is no rigidity, no rebound, no guarding, no CVA tenderness, no tenderness at McBurney's point and negative Murphy's sign.  Genitourinary: Uterus normal. There is no rash, tenderness or lesion on the right labia. There is no rash, tenderness or lesion on the left labia. Uterus is not enlarged and not tender. Cervix exhibits discharge. Right adnexum displays no mass  and no tenderness. Left adnexum displays no mass and no tenderness. No tenderness or bleeding in the vagina. No vaginal discharge found.  Lymphadenopathy:       Right: No inguinal and no supraclavicular adenopathy present.       Left: No inguinal and no supraclavicular adenopathy present.  Neurological: She is alert and oriented to person, place, and time.  Skin: Skin is warm and dry. No cyanosis. No pallor.  Psychiatric: She has a normal mood and affect. Her speech is normal and behavior is normal. Thought content normal.   BP 122/74 mmHg  Pulse 64  Temp(Src) 98.3 F (36.8 C) (Oral)  Wt 226 lb 6.4 oz (102.694 kg)  Urinalysis dipstick negative Urine pregnancy negative    Assessment & Plan:  Pelvic pain in female - Plan: GC/chlamydia probe amp, urine, POCT urine pregnancy, POCT urinalysis dipstick  Nausea without vomiting - Plan: POCT urine pregnancy, ondansetron (ZOFRAN) 4 MG tablet  Discussed that her pregnancy test was negative and that her abdominal discomfort could be related to her chronic pelvic issues. Discussed that there is no evidence of an infectious etiology. We will order STI testing per her request. Prescribed Zofran for nausea. Recommend that she take 800 mg of ibuprofen or the tramadol she has at home for pain. Also recommend that she contact her OB/GYN and see if they could possibly get her in sooner than December if her symptoms persist.

## 2015-08-04 NOTE — Patient Instructions (Signed)
I recommend taking the Zofran for nausea, it was sent to your pharmacy. I also suggest taking either 800 mg of ibuprofen with food or the tramadol you have at home for the pain. I also suggest calling your OB/GYN and try to get in with them sooner if this pain continues.

## 2015-08-05 LAB — GC/CHLAMYDIA PROBE AMP, URINE
Chlamydia, Swab/Urine, PCR: NEGATIVE
GC Probe Amp, Urine: NEGATIVE

## 2015-09-09 ENCOUNTER — Ambulatory Visit (INDEPENDENT_AMBULATORY_CARE_PROVIDER_SITE_OTHER): Payer: 59 | Admitting: Obstetrics & Gynecology

## 2015-09-09 VITALS — BP 120/67 | HR 87 | Ht 65.0 in | Wt 228.4 lb

## 2015-09-09 DIAGNOSIS — N83201 Unspecified ovarian cyst, right side: Secondary | ICD-10-CM

## 2015-09-09 DIAGNOSIS — B9689 Other specified bacterial agents as the cause of diseases classified elsewhere: Secondary | ICD-10-CM

## 2015-09-09 DIAGNOSIS — N760 Acute vaginitis: Secondary | ICD-10-CM

## 2015-09-09 DIAGNOSIS — N809 Endometriosis, unspecified: Secondary | ICD-10-CM | POA: Diagnosis not present

## 2015-09-09 DIAGNOSIS — A499 Bacterial infection, unspecified: Secondary | ICD-10-CM

## 2015-09-09 DIAGNOSIS — Z113 Encounter for screening for infections with a predominantly sexual mode of transmission: Secondary | ICD-10-CM

## 2015-09-09 DIAGNOSIS — Z01419 Encounter for gynecological examination (general) (routine) without abnormal findings: Secondary | ICD-10-CM

## 2015-09-09 MED ORDER — NAPROXEN 500 MG PO TABS: 500.0000 mg | ORAL_TABLET | Freq: Two times a day (BID) | ORAL | Status: DC

## 2015-09-09 NOTE — Progress Notes (Signed)
Subjective:   Frances Hill is a 29 y.o. G0 with extensive obstetrical history female of endometriosis, PID, bilateral ovarian cyst s/p left salpingoophorectomy 2015, right ovarian cystectomy and myomectomy 04/2015 and endometrioma s/p lysis of adhesions 04/2015 who presents today for an annual women's exam. Frances Hill continues to have uncontrolled lower abdominal pelvic pain with dyspareunia on sch motrin and tramadol q6 prn. She admits that she has not used norethindrone 2.5mg  regularly as she states she does not feel its effects. She states that pain interferes with herdaily activities and was worried that her constant pain is perceived as pain seeking behavior by providers.   She is also interested in obtaining STD screening tests today due to concern of partner's status. She is sexually active and reports inconsistent use of condoms. GYN screening history: last pap: approximate date 2015 and was normal.  Past Medical History  Diagnosis Date  . Anxiety   . Sickle cell trait ([place])   . Endometriosis   . History of trichomoniasis   . History of pelvic inflammatory disease   . Cyst of right ovary   . History of ovarian cyst     12-25-2013 s/p removal left ovarian endometrioma    Past Surgical History  Procedure Laterality Date  . Laparoscopy N/A 12/25/2013    Procedure: OPERATIVE LAPAROSCOPY; Surgeon: Osborne Oman, MD; Location: [facility]; Service: Gynecology; Laterality: N/A;  . Salpingoophorectomy Left 12/25/2013    Procedure: SALPINGO OOPHORECTOMY; Surgeon: Osborne Oman, MD; Location: [facility]; Service: Gynecology; Laterality: Left;  . Laparoscopic ovarian cystectomy Right 04/24/2015    Procedure: LAPAROSCOPIC, RIGHT OVARIAN CYSTECTOMY, GEL PORT ASSISTED MYOMECTOMY; Surgeon: Governor Specking, MD; Location: [facility]; Service: Gynecology; Laterality: Right;  . Laparoscopic lysis of  adhesions  04/24/2015    Procedure: LAPAROSCOPIC LYSIS OF ADHESIONS AND EXCISION ENDOMETRIOTIC IMPLANTS; Surgeon: Governor Specking, MD; Location: [facility]; Service: Gynecology;;  . Chromopertubation  04/24/2015    Procedure: CHROMOPERTUBATION; Surgeon: Governor Specking, MD; Location: [facility]; Service: Gynecology;;   Menstrual History: OB History    Gravida Para Term Preterm AB TAB SAB Ectopic Multiple Living   0 0 0 0 0 0 0 0 0 0      Allergies: She has No Known Allergies..  Current Outpatient Prescriptions on File Prior to Visit  Medication Sig Dispense Refill  . norethindrone (AYGESTIN) 5 MG tablet Take 2.5 mg by mouth daily.    Marland Kitchen ibuprofen (ADVIL,MOTRIN) 800 MG tablet Take 1 tablet (800 mg total) by mouth every 8 (eight) hours as needed. (Patient not taking: Reported on 09/09/2015) 60 tablet 1  . ondansetron (ZOFRAN) 4 MG tablet Take 1 tablet (4 mg total) by mouth every 8 (eight) hours as needed for nausea or vomiting. (Patient not taking: Reported on 09/09/2015) 20 tablet 0  . traMADol (ULTRAM) 50 MG tablet Take 50 mg by mouth every 6 (six) hours as needed.     Review of Systems Constitutional: Positive for nausea. Negative for fatigue, fevers/chills and malaise Gastrointestinal: negative for abdominal pain, constipation, diarrhea and reflux symptoms Genitourinary: positive for dyspareunia and umbilical pain and right abdominal pain. Negative for genital lesions and vaginal discharge, dysuria, frequency and hematuria Integument/breast: negative for breast lump, breast tenderness and nipple discharge Musculoskeletal: negative for arthralgias and back pain  Objective:  Blood pressure 120/67, pulse 87, height 5\' 5"  (1.651 m), weight 228 lb 6.4 oz (103.602 kg), last menstrual period 08/24/2015.  General Appearance: Frances Hill is well  appearing, pleasant and in no distress today on  exam. Heart: Regular rate and rhythm. Normal S1 and S1. No murmurs, gallops or rubs. Lungs: Clear to auscultation bilaterally, no crackles, wheezes or rhonchi Abd: Mild tenderness to palpation at umbilical region at area of healed laparoscopic site. No guarding or rebound. Abdomen otherwise soft, nondistended. Extremities: no appreciable lower extremity edema bilaterally. 2+ dorsalis pedis and posterior tibialis pulses bilaterally Breast: Breasts appear normal, no suspicious masses, no skin or nipple changes. No LA on axillary nodes. GU: normal appearing external genitalia  Pertinent Imaging: 06/10/2015- CT Abdomen Findings:  Uterus: Mass extending along the posterior wall of the uterus measuring 7.5 by 4.5 cm (image 58, series 3). This mass appears contiguous with the myometrium through central bridging tissue measuring 2 cm on image 58, series 3.  Ovary: RIGHT ovary appears normal measuring 3.9 by 2.3 x 3.7 cm.  LEFT ovary: surgically absent. Impression: This is most consistent with a pedunculated leiomyoma exophytic to the posterior wall. This mass has heterogeneous in enhancement.  01/30/2015- Pelvic US Uterus: Measurements: 8.0 x 5.9 x 5.2 cm. Anteverted, anteflexed.  Posterior uterine fundal and body contour abnormalities are identified as follows: Posterior fundal uterine mass, 3.6 x 3.4 x 3.3 cm, partly subserosal. Posterior uterine fundal intramural probable fibroid, 1.1 x 1.1 x 1.0 cm. Posterior mid body subserosal probable fibroid, 3.7 x 3.1 x 2.6 cm. Endometrium- Thickness: 10 mm. No focal abnormality visualized. Right ovary: Measurements: 5.2 x 4.9 x 4.1 cm, with complex cystic lesion with ground-glass internal echoes and lobulated contour not measurable separate from the right ovary. Left ovary- Surgically absent. No adnexal mass  Assessment and Plain:  Frances Hill is a 29 yo G0 who is here at [facility] clinic for an annual exam and STD  screening. She has no new complaints but continues to have uncontrolled suprapubic pain on tramadol 50mg  q6 and sch motrin, although reports inconsistent use of norethindrone 2.5mg  q24.   Patient Active Problem List   Diagnosis Date Noted  . Endometriosis of ovary 04/24/2015  . Dyspareunia 04/22/2015  . Venereal disease contact 04/22/2015  . Missed period 04/22/2015  . Cyst of right ovary 04/22/2015  . Endometriosis 01/14/2014  . Ovarian cyst 12/11/2013   1. Dyspareunia and Suprapubic pain : Uncontrolled pelvic pain likely due to combination of patient's complicated obstetrical history including surgical adhesions, endometriosis and leiomyoma. Pain is likely exacerbated due to inconsistent use of norethindrone.  Education: We discussed with Ms. Stasko the importance of taking norethindrone consistently to prevent growth of fibroids and decrease pelvic pain from endometriosis. We clarified the MOA of norethindrone and her concerns of being in medical menopause. We also discussed other treatment options including hysterectomy and its disadvantages due to patient's young age. We also re-addressed patient's expectations for pain management. We acknowledged that she is in pain and explained to her that her pain is likely due to a combination of surgical adhesions, endometriosis and leiomyoma and encouraged her to voice her questions and concerns to her fertility specialist when discussing possible treatment options. Patient agreed and understood.  Therapeutic: 1. Discontinued tramadol and ibuprofen 2. Re-filled prescription for naproxen 500mg  BID. 3. Encourage pt to take norethindrone 2.5mg  daily.  2. STD screening: Education: Discussed the importance of regular condom use to prevent STDs.  Diagnostic: 1. GC/Chlamydia screening: vaginal sample collected. Results pending 2. HIV, Hep B, Hep C, RPR screening: blood sample collected. Results pending.   Elita Boone,  Med Student 09/09/2015 3:11 PM  Attestation of Attending Supervision of Medical Student: Evaluation and management procedures were performed by the Medical Student under my supervision. I have seen and examined the patient, reviewed the the student's note and chart, and I agree with management and plan as outlined above.  Verita Schneiders, MD, [place] Attending [place], [facility]

## 2015-09-09 NOTE — Progress Notes (Deleted)
Subjective:    Frances Hill is a 29 y.o. G0 with extensive obstetrical history female of endometriosis, PID, bilateral ovarian cyst s/p left salpingoophorectomy 2015, right ovarian cystectomy and myomectomy 04/2015 and endometrioma s/p lysis of adhesions 04/2015 who presents today for an annual women's exam.  Frances Hill continues to have uncontrolled lower abdominal pelvic pain with dyspareunia on sch motrin and tramadol q6 prn. She admits that she has not used norethindrone 2.5mg  regularly as she states she does not feel its effects. She states that pain interferes with herdaily activities and was worried that her constant pain is perceived as pain seeking behavior by providers.   She is also interested in obtaining STD screening tests today due to concern of partner's status. She is sexually active and reports inconsistent use of condoms.  GYN screening history: last pap: approximate date 2015 and was normal.  Past Medical History  Diagnosis Date  . Anxiety   . Sickle cell trait ([place])   . Endometriosis   . History of trichomoniasis   . History of pelvic inflammatory disease   . Cyst of right ovary   . History of ovarian cyst     12-25-2013  s/p removal left ovarian endometrioma    Past Surgical History  Procedure Laterality Date  . Laparoscopy N/A 12/25/2013    Procedure: OPERATIVE LAPAROSCOPY;  Surgeon: Osborne Oman, MD;  Location: [facility];  Service: Gynecology;  Laterality: N/A;  . Salpingoophorectomy Left 12/25/2013    Procedure: SALPINGO OOPHORECTOMY;  Surgeon: Osborne Oman, MD;  Location: [facility];  Service: Gynecology;  Laterality: Left;  . Laparoscopic ovarian cystectomy Right 04/24/2015    Procedure: LAPAROSCOPIC, RIGHT OVARIAN CYSTECTOMY,  GEL PORT ASSISTED MYOMECTOMY;  Surgeon: Governor Specking, MD;  Location: [place];  Service: Gynecology;  Laterality: Right;  . Laparoscopic lysis of adhesions  04/24/2015    Procedure: LAPAROSCOPIC LYSIS OF ADHESIONS AND  EXCISION ENDOMETRIOTIC IMPLANTS;  Surgeon: Governor Specking, MD;  Location: [facility];  Service: Gynecology;;  . Chromopertubation  04/24/2015    Procedure: CHROMOPERTUBATION;  Surgeon: Governor Specking, MD;  Location: [place];  Service: Gynecology;;   Menstrual History: OB History    Gravida Para Term Preterm AB TAB SAB Ectopic Multiple Living   0 0 0 0 0 0 0 0 0 0      Allergies: She has No Known Allergies..  Current Outpatient Prescriptions on File Prior to Visit  Medication Sig Dispense Refill  . norethindrone (AYGESTIN) 5 MG tablet Take 2.5 mg by mouth daily.    Marland Kitchen ibuprofen (ADVIL,MOTRIN) 800 MG tablet Take 1 tablet (800 mg total) by mouth every 8 (eight) hours as needed. (Patient not taking: Reported on 09/09/2015) 60 tablet 1  . ondansetron (ZOFRAN) 4 MG tablet Take 1 tablet (4 mg total) by mouth every 8 (eight) hours as needed for nausea or vomiting. (Patient not taking: Reported on 09/09/2015) 20 tablet 0  . traMADol (ULTRAM) 50 MG tablet Take 50 mg by mouth every 6 (six) hours as needed.     Review of Systems Constitutional: Positive for nausea. Negative for fatigue, fevers/chills and malaise Gastrointestinal: negative for abdominal pain, constipation, diarrhea and reflux symptoms Genitourinary: positive for dyspareunia and umbilical pain and right abdominal pain. Negative for genital lesions and vaginal discharge, dysuria, frequency and hematuria Integument/breast: negative for breast lump, breast tenderness and nipple discharge Musculoskeletal: negative for arthralgias and back pain   Objective:  Blood pressure 120/67, pulse 87, height 5\' 5"  (  1.651 m), weight 228 lb 6.4 oz (103.602 kg), last menstrual period 08/24/2015.  General Appearance: Frances Hill is well appearing, pleasant and in no distress today on exam. Heart: Regular rate and rhythm. Normal S1 and S1. No murmurs, gallops or rubs. Lungs: Clear to auscultation bilaterally, no  crackles, wheezes or rhonchi Abd: Mild tenderness to palpation at umbilical region at area of healed laparoscopic site. No guarding or rebound. Abdomen otherwise soft, nondistended. Extremities: no appreciable lower extremity edema bilaterally. 2+ dorsalis pedis and posterior tibialis pulses bilaterally Breast: Breasts appear normal, no suspicious masses, no skin or nipple changes. No LA on axillary nodes. GU: normal appearing external genitalia  Pertinent Imaging: 06/10/2015- CT Abdomen Findings:  Uterus: Mass extending along the posterior wall of the uterus measuring 7.5 by 4.5 cm (image 58, series 3). This mass appears contiguous with the myometrium through central bridging tissue measuring 2 cm on image 58, series 3.   Ovary: RIGHT ovary appears normal measuring 3.9 by 2.3 x 3.7 cm.   LEFT ovary: surgically absent. Impression: This is most consistent with a pedunculated leiomyoma exophytic to the posterior wall. This mass has heterogeneous in enhancement.  01/30/2015- Pelvic US Uterus: Measurements: 8.0 x 5.9 x 5.2 cm. Anteverted, anteflexed.  Posterior uterine fundal and body contour abnormalities are identified as follows: Posterior fundal uterine mass, 3.6 x 3.4 x 3.3 cm, partly subserosal. Posterior uterine fundal intramural probable fibroid, 1.1 x 1.1 x 1.0 cm. Posterior mid body subserosal probable fibroid, 3.7 x 3.1 x 2.6 cm. Endometrium- Thickness: 10 mm. No focal abnormality visualized. Right ovary: Measurements: 5.2 x 4.9 x 4.1 cm, with complex cystic lesion with ground-glass internal echoes and lobulated contour not measurable separate from the right ovary. Left ovary- Surgically absent. No adnexal mass  Assessment and Plain:  [name] is a 29 yo G0 who is here at [facility] clinic for an annual exam and STD screening. She has no new complaints but continues to have uncontrolled suprapubic pain on tramadol 50mg  q6 and sch motrin, although reports inconsistent use  of norethindrone 2.5mg  q24.   Patient Active Problem List   Diagnosis Date Noted  . Endometriosis of ovary 04/24/2015  . Dyspareunia 04/22/2015  . Venereal disease contact 04/22/2015  . Missed period 04/22/2015  . Cyst of right ovary 04/22/2015  . Endometriosis 01/14/2014  . Ovarian cyst 12/11/2013   1. Dyspareunia and Suprapubic pain : Uncontrolled pelvic pain likely due to combination of patient's complicated obstetrical history including surgical adhesions, endometriosis and leiomyoma. Pain is likely exacerbated due to inconsistent use of norethindrone.  Education: We discussed with Ms. Peikert the importance of taking norethindrone consistently to prevent growth of fibroids and decrease pelvic pain from endometriosis. We clarified the MOA of norethindrone and her concerns of being in medical menopause. We also discussed other treatment options including hysterectomy and its disadvantages due to patient's young age. We also re-addressed patient's expectations for pain management. We acknowledged that she is in pain and explained to her that her pain is likely due to a combination of surgical adhesions, endometriosis and leiomyoma and encouraged her to voice her questions and concerns to her fertility specialist when discussing possible treatment options. Patient agreed and understood.  Therapeutic: 1. Discontinued tramadol and ibuprofen 2. Re-filled prescription for naproxen 500mg  BID. 3. Encourage pt to take norethindrone 2.5mg  daily.  2. STD screening: Education: Discussed the importance of regular condom use to prevent STDs.  Diagnostic: 1. GC/Chlamydia screening: vaginal sample collected. Results pending 2.  HIV, Hep B, Hep C, RPR screening: blood sample collected. Results pending.    Elita Boone, Med Student 09/09/2015 3:11 PM     Attestation of Attending Supervision of Medical Student: Evaluation and management procedures were performed by the Medical Student under my  supervision.  I have seen and examined the patient, reviewed the the student's note and chart, and I agree with management and plan as outlined above.  Verita Schneiders, MD, [place] Attending [place], [facility]

## 2015-09-10 ENCOUNTER — Telehealth: Payer: Self-pay | Admitting: General Practice

## 2015-09-10 ENCOUNTER — Encounter: Payer: Self-pay | Admitting: General Practice

## 2015-09-10 LAB — WET PREP, GENITAL
Trich, Wet Prep: NONE SEEN
Yeast Wet Prep HPF POC: NONE SEEN

## 2015-09-10 LAB — HIV ANTIBODY (ROUTINE TESTING W REFLEX): HIV 1&2 Ab, 4th Generation: NONREACTIVE

## 2015-09-10 LAB — RPR

## 2015-09-10 LAB — HEPATITIS B SURFACE ANTIGEN: Hepatitis B Surface Ag: NEGATIVE

## 2015-09-10 LAB — GC/CHLAMYDIA PROBE AMP (~~LOC~~) NOT AT ARMC
Chlamydia: NEGATIVE
Neisseria Gonorrhea: NEGATIVE

## 2015-09-10 LAB — HEPATITIS C ANTIBODY: HCV Ab: NEGATIVE

## 2015-09-10 MED ORDER — METRONIDAZOLE 500 MG PO TABS: 500.0000 mg | ORAL_TABLET | Freq: Two times a day (BID) | ORAL | Status: DC

## 2015-09-10 NOTE — Addendum Note (Signed)
Addended by: Verita Schneiders A on: 09/10/2015 12:05 PM   Modules accepted: Orders

## 2015-09-10 NOTE — Telephone Encounter (Signed)
Per Dr Harolyn Rutherford, patient's wet prep shows BV. Rx has been sent to pharmacy. Called patient and message states this person is unavailable right now. Called patient at emergency contact number and her father answered stating she wasn't in but would tell her we called. Will send letter

## 2015-09-24 ENCOUNTER — Telehealth: Payer: Self-pay

## 2015-09-24 NOTE — Telephone Encounter (Signed)
Patient called Frances Hill back regarding her prescription. I read Carrie's note and gave patient instructions she understood and also gave me her new phone number. She will pick up her rx today.

## 2015-09-25 ENCOUNTER — Encounter (HOSPITAL_COMMUNITY): Payer: Self-pay | Admitting: Anesthesiology

## 2015-09-25 ENCOUNTER — Encounter (HOSPITAL_COMMUNITY): Payer: Self-pay

## 2015-09-25 ENCOUNTER — Inpatient Hospital Stay (HOSPITAL_COMMUNITY)
Admission: AD | Admit: 2015-09-25 | Discharge: 2015-09-25 | Disposition: A | Discharge status: Home / Self Care | Payer: 59 | Source: Ambulatory Visit | Attending: Family Medicine | Admitting: Family Medicine

## 2015-09-25 DIAGNOSIS — D573 Sickle-cell trait: Secondary | ICD-10-CM | POA: Diagnosis not present

## 2015-09-25 DIAGNOSIS — R319 Hematuria, unspecified: Secondary | ICD-10-CM

## 2015-09-25 DIAGNOSIS — N390 Urinary tract infection, site not specified: Secondary | ICD-10-CM | POA: Diagnosis not present

## 2015-09-25 DIAGNOSIS — R109 Unspecified abdominal pain: Secondary | ICD-10-CM | POA: Diagnosis not present

## 2015-09-25 DIAGNOSIS — R300 Dysuria: Secondary | ICD-10-CM | POA: Diagnosis not present

## 2015-09-25 DIAGNOSIS — Z8249 Family history of ischemic heart disease and other diseases of the circulatory system: Secondary | ICD-10-CM | POA: Insufficient documentation

## 2015-09-25 DIAGNOSIS — R350 Frequency of micturition: Secondary | ICD-10-CM | POA: Insufficient documentation

## 2015-09-25 DIAGNOSIS — Z8742 Personal history of other diseases of the female genital tract: Secondary | ICD-10-CM | POA: Diagnosis not present

## 2015-09-25 DIAGNOSIS — R102 Pelvic and perineal pain: Secondary | ICD-10-CM | POA: Diagnosis present

## 2015-09-25 DIAGNOSIS — R1030 Lower abdominal pain, unspecified: Secondary | ICD-10-CM | POA: Diagnosis not present

## 2015-09-25 DIAGNOSIS — M549 Dorsalgia, unspecified: Secondary | ICD-10-CM | POA: Diagnosis not present

## 2015-09-25 DIAGNOSIS — Z90721 Acquired absence of ovaries, unilateral: Secondary | ICD-10-CM | POA: Insufficient documentation

## 2015-09-25 DIAGNOSIS — R51 Headache: Secondary | ICD-10-CM | POA: Diagnosis not present

## 2015-09-25 LAB — URINALYSIS, ROUTINE W REFLEX MICROSCOPIC
Bilirubin Urine: NEGATIVE
Glucose, UA: NEGATIVE mg/dL
Ketones, ur: 15 mg/dL — AB
Nitrite: POSITIVE — AB
Protein, ur: 100 mg/dL — AB
Specific Gravity, Urine: 1.015 (ref 1.005–1.030)
pH: 6 (ref 5.0–8.0)

## 2015-09-25 LAB — CBC
HCT: 37.3 % (ref 36.0–46.0)
Hemoglobin: 12.6 g/dL (ref 12.0–15.0)
MCH: 25.4 pg — ABNORMAL LOW (ref 26.0–34.0)
MCHC: 33.8 g/dL (ref 30.0–36.0)
MCV: 75.2 fL — ABNORMAL LOW (ref 78.0–100.0)
Platelets: 290 10*3/uL (ref 150–400)
RBC: 4.96 MIL/uL (ref 3.87–5.11)
RDW: 14.4 % (ref 11.5–15.5)
WBC: 12 10*3/uL — ABNORMAL HIGH (ref 4.0–10.5)

## 2015-09-25 LAB — COMPREHENSIVE METABOLIC PANEL
ALT: 12 U/L — ABNORMAL LOW (ref 14–54)
AST: 15 U/L (ref 15–41)
Albumin: 3.9 g/dL (ref 3.5–5.0)
Alkaline Phosphatase: 52 U/L (ref 38–126)
Anion gap: 9 (ref 5–15)
BUN: 10 mg/dL (ref 6–20)
CO2: 25 mmol/L (ref 22–32)
Calcium: 9.2 mg/dL (ref 8.9–10.3)
Chloride: 103 mmol/L (ref 101–111)
Creatinine, Ser: 0.94 mg/dL (ref 0.44–1.00)
GFR calc Af Amer: >60 mL/min (ref >60)
GFR calc non Af Amer: >60 mL/min (ref >60)
Glucose, Bld: 114 mg/dL — ABNORMAL HIGH (ref 65–99)
Potassium: 3.2 mmol/L — ABNORMAL LOW (ref 3.5–5.1)
Sodium: 137 mmol/L (ref 135–145)
Total Bilirubin: 0.7 mg/dL (ref 0.3–1.2)
Total Protein: 7.6 g/dL (ref 6.5–8.1)

## 2015-09-25 LAB — POCT PREGNANCY, URINE: Preg Test, Ur: NEGATIVE

## 2015-09-25 LAB — URINE MICROSCOPIC-ADD ON

## 2015-09-25 MED ORDER — PHENAZOPYRIDINE HCL 100 MG PO TABS
200.0000 mg | ORAL_TABLET | Freq: Once | ORAL | Status: AC
  Administered 2015-09-25: 200 mg via ORAL
  Filled 2015-09-25: qty 2

## 2015-09-25 MED ORDER — METOCLOPRAMIDE HCL 5 MG/ML IJ SOLN
10.0000 mg | Freq: Once | INTRAMUSCULAR | Status: AC
  Administered 2015-09-25: 10 mg via INTRAVENOUS
  Filled 2015-09-25: qty 2

## 2015-09-25 MED ORDER — DIPHENHYDRAMINE HCL 50 MG/ML IJ SOLN
25.0000 mg | Freq: Once | INTRAMUSCULAR | Status: AC
  Administered 2015-09-25: 25 mg via INTRAVENOUS
  Filled 2015-09-25: qty 1

## 2015-09-25 MED ORDER — SODIUM CHLORIDE 0.9 % IV BOLUS (SEPSIS)
1000.0000 mL | Freq: Once | INTRAVENOUS | Status: AC
  Administered 2015-09-25: 1000 mL via INTRAVENOUS

## 2015-09-25 MED ORDER — NITROFURANTOIN MONOHYD MACRO 100 MG PO CAPS: 100.0000 mg | ORAL_CAPSULE | Freq: Two times a day (BID) | ORAL | Status: DC

## 2015-09-25 MED ORDER — KETOROLAC TROMETHAMINE 60 MG/2ML IM SOLN
60.0000 mg | Freq: Once | INTRAMUSCULAR | Status: AC
  Administered 2015-09-25: 60 mg via INTRAMUSCULAR
  Filled 2015-09-25: qty 2

## 2015-09-25 MED ORDER — DEXTROSE 5 % IV SOLN
1.0000 g | Freq: Once | INTRAVENOUS | Status: AC
  Administered 2015-09-25: 1 g via INTRAVENOUS
  Filled 2015-09-25: qty 10

## 2015-09-25 MED ORDER — PHENAZOPYRIDINE HCL 200 MG PO TABS: 200.0000 mg | ORAL_TABLET | Freq: Once | ORAL | Status: DC

## 2015-09-25 MED ORDER — PHENAZOPYRIDINE HCL 200 MG PO TABS: 200.0000 mg | ORAL_TABLET | Freq: Three times a day (TID) | ORAL | Status: DC

## 2015-09-25 MED ORDER — DEXAMETHASONE SODIUM PHOSPHATE 10 MG/ML IJ SOLN
10.0000 mg | Freq: Once | INTRAMUSCULAR | Status: AC
  Administered 2015-09-25: 10 mg via INTRAVENOUS
  Filled 2015-09-25: qty 1

## 2015-09-25 NOTE — MAU Provider Note (Signed)
History     CSN: [id number]  Arrival date and time: 09/25/15 1527   First Provider Initiated Contact with Patient 09/25/15 1649      Chief Complaint  Patient presents with  . Pelvic Pain  . Back Pain  . Dysuria   HPI  Pt is not pregnant [id number] who presents with pelvic and back pain.  Pt c/ of frequent urination voiding small amounts for about 1 week..  Pt was seen earlier today at [facility] urgent Care Pt has hx of endometriosis and has seen Dr. Harolyn Rutherford and also Dr. Carmela Rima for endometriosis and preservation of fertility.  Pt has had left salpingo ooporectomy by Dr. Harolyn Rutherford and laparoscopic right ovarian cystectomy,myomectomy,lysis of adhesions excision of endometriotic implants with chromopertubation by dr. Carmela Rima.  Pt is on Norethindone daily and has irregular bleeding.  Pt is bleeding at this time. Pain is located lower abdomen - more on right side radiating to back. Pt states she has had chills and fever and nausea. Pt has a frontal headache with photophobia- has not taken anything for the headache - started this afternoon. Pt states she has hx of kidney infection about 6 months ago- has not had urological evaluation. Pt was seen 09/09/2015 - dx with BV and treated with Flagyl- other testing was negative Pt last had IC 3 weeks ago-  Pt took Aleve yesterday without relief of pain- aleve usually helps with cramping.  Past Medical History  Diagnosis Date  . Anxiety   . Sickle cell trait ([place])   . Endometriosis   . History of trichomoniasis   . History of pelvic inflammatory disease   . Cyst of right ovary   . History of ovarian cyst     12-25-2013  s/p removal left ovarian endometrioma    Past Surgical History  Procedure Laterality Date  . Laparoscopy N/A 12/25/2013    Procedure: OPERATIVE LAPAROSCOPY;  Surgeon: Osborne Oman, MD;  Location: [facility];  Service: Gynecology;  Laterality: N/A;  . Salpingoophorectomy Left 12/25/2013    Procedure: SALPINGO OOPHORECTOMY;   Surgeon: Osborne Oman, MD;  Location: [facility];  Service: Gynecology;  Laterality: Left;  . Laparoscopic ovarian cystectomy Right 04/24/2015    Procedure: LAPAROSCOPIC, RIGHT OVARIAN CYSTECTOMY,  GEL PORT ASSISTED MYOMECTOMY;  Surgeon: Governor Specking, MD;  Location: [place];  Service: Gynecology;  Laterality: Right;  . Laparoscopic lysis of adhesions  04/24/2015    Procedure: LAPAROSCOPIC LYSIS OF ADHESIONS AND EXCISION ENDOMETRIOTIC IMPLANTS;  Surgeon: Governor Specking, MD;  Location: [facility];  Service: Gynecology;;  . Chromopertubation  04/24/2015    Procedure: CHROMOPERTUBATION;  Surgeon: Governor Specking, MD;  Location: [place];  Service: Gynecology;;    Family History  Problem Relation Age of Onset  . Aneurysm Mother 38    died of aneurysm  . Cancer Mother 62    breast, twice  . Hypertension Father   . Depression Father   . Heart disease Father     valve disease  . Other Father     DDD  . Cancer Maternal Aunt     breast cancer  . Diabetes Neg Hx   . Stroke Neg Hx   . Other Brother     DDD  . Sarcoidosis Brother     Social History  Substance Use Topics  . Smoking status: Never Smoker   . Smokeless tobacco: Never Used  . Alcohol Use: Yes     Comment: occasionally    Allergies: No  Known Allergies  Prescriptions prior to admission  Medication Sig Dispense Refill Last Dose  . ibuprofen (ADVIL,MOTRIN) 800 MG tablet Take 1 tablet (800 mg total) by mouth every 8 (eight) hours as needed. (Patient not taking: Reported on 09/09/2015) 60 tablet 1 Not Taking  . metroNIDAZOLE (FLAGYL) 500 MG tablet Take 1 tablet (500 mg total) by mouth 2 (two) times daily. 14 tablet 0   . naproxen (NAPROSYN) 500 MG tablet Take 1 tablet (500 mg total) by mouth 2 (two) times daily with a meal. As needed for pain 60 tablet 7   . norethindrone (AYGESTIN) 5 MG tablet Take 2.5 mg by mouth daily.   Taking  . ondansetron (ZOFRAN) 4 MG tablet Take 1  tablet (4 mg total) by mouth every 8 (eight) hours as needed for nausea or vomiting. (Patient not taking: Reported on 09/09/2015) 20 tablet 0 Not Taking  . traMADol (ULTRAM) 50 MG tablet Take 50 mg by mouth every 6 (six) hours as needed.   Not Taking    Review of Systems  Constitutional: Positive for fever and chills.  Gastrointestinal: Positive for nausea and abdominal pain. Negative for vomiting, diarrhea and constipation.  Genitourinary: Positive for dysuria and frequency.  Musculoskeletal: Positive for back pain.  Neurological: Positive for headaches.   Physical Exam   Blood pressure 136/67, pulse 86, temperature 98.8 F (37.1 C), temperature source Oral, resp. rate 18, last menstrual period 09/14/2015.  Physical Exam  MAU Course  Procedures Results for orders placed or performed during the hospital encounter of 09/25/15 (from the past 24 hour(s))  Urinalysis, Routine w reflex microscopic (not at [facility])     Status: Abnormal   Collection Time: 09/25/15  4:10 PM  Result Value Ref Range   Color, Urine YELLOW YELLOW   APPearance CLOUDY (A) CLEAR   Specific Gravity, Urine 1.015 1.005 - 1.030   pH 6.0 5.0 - 8.0   Glucose, UA NEGATIVE NEGATIVE mg/dL   Hgb urine dipstick LARGE (A) NEGATIVE   Bilirubin Urine NEGATIVE NEGATIVE   Ketones, ur 15 (A) NEGATIVE mg/dL   Protein, ur 100 (A) NEGATIVE mg/dL   Nitrite POSITIVE (A) NEGATIVE   Leukocytes, UA MODERATE (A) NEGATIVE  Urine microscopic-add on     Status: Abnormal   Collection Time: 09/25/15  4:10 PM  Result Value Ref Range   Squamous Epithelial / LPF 0-5 (A) NONE SEEN   WBC, UA 6-30 0 - 5 WBC/hpf   RBC / HPF 6-30 0 - 5 RBC/hpf   Bacteria, UA FEW (A) NONE SEEN  Pregnancy, urine POC     Status: None   Collection Time: 09/25/15  4:57 PM  Result Value Ref Range   Preg Test, Ur NEGATIVE NEGATIVE  CBC     Status: Abnormal   Collection Time: 09/25/15  5:06 PM  Result Value Ref Range   WBC 12.0 (H) 4.0 - 10.5 K/uL   RBC 4.96 3.87 -  5.11 MIL/uL   Hemoglobin 12.6 12.0 - 15.0 g/dL   HCT 37.3 36.0 - 46.0 %   MCV 75.2 (L) 78.0 - 100.0 fL   MCH 25.4 (L) 26.0 - 34.0 pg   MCHC 33.8 30.0 - 36.0 g/dL   RDW 14.4 11.5 - 15.5 %   Platelets 290 150 - 400 K/uL  Comprehensive metabolic panel     Status: Abnormal   Collection Time: 09/25/15  5:06 PM  Result Value Ref Range   Sodium 137 135 - 145 mmol/L   Potassium 3.2 (L) 3.5 -  5.1 mmol/L   Chloride 103 101 - 111 mmol/L   CO2 25 22 - 32 mmol/L   Glucose, Bld 114 (H) 65 - 99 mg/dL   BUN 10 6 - 20 mg/dL   Creatinine, Ser 0.94 0.44 - 1.00 mg/dL   Calcium 9.2 8.9 - 10.3 mg/dL   Total Protein 7.6 6.5 - 8.1 g/dL   Albumin 3.9 3.5 - 5.0 g/dL   AST 15 15 - 41 U/L   ALT 12 (L) 14 - 54 U/L   Alkaline Phosphatase 52 38 - 126 U/L   Total Bilirubin 0.7 0.3 - 1.2 mg/dL   GFR calc non Af Amer >60 >60 mL/min   GFR calc Af Amer >60 >60 mL/min   Anion gap 9 5 - 15  urine culture pending Toradol 60mg  IM Dr. Nehemiah Settle consulted- will give IVF with Rocephin 1 gm abd pain resolved - headache got better then returned Reglan 10mg  IV, Decadron 10mg  IV and Benadryl 25mg  IV given Pt's headache gone and pt ready for discharge\Pyridium 200mg  PO given in MAU Assessment and Plan  Abdominal pain Hx of endometriosis UTI- urine culture pending; Macrobid Rx and Pyridium F/u with Dr. Gladis Riffle 09/25/2015, 4:50 PM

## 2015-09-25 NOTE — MAU Note (Signed)
Pt went to Urgent Care today, was advised to go to an ED.  Pt C/O pelvic pain for the past week, back started hurting today, also lower abdomen.  Frequent urination, dysuria.  Temp has been 100.9 @ home & at Urgent Care.  Has HA.

## 2015-09-27 LAB — URINE CULTURE
Culture: >=100000
Special Requests: NORMAL

## 2015-09-30 ENCOUNTER — Encounter: Payer: Self-pay | Admitting: Medical

## 2015-09-30 ENCOUNTER — Ambulatory Visit (INDEPENDENT_AMBULATORY_CARE_PROVIDER_SITE_OTHER): Payer: 59 | Admitting: Medical

## 2015-09-30 ENCOUNTER — Inpatient Hospital Stay: Payer: 59 | Admitting: Medical

## 2015-09-30 VITALS — BP 104/68 | HR 60 | Wt 233.0 lb

## 2015-09-30 DIAGNOSIS — F411 Generalized anxiety disorder: Secondary | ICD-10-CM | POA: Insufficient documentation

## 2015-09-30 DIAGNOSIS — R51 Headache: Secondary | ICD-10-CM | POA: Diagnosis not present

## 2015-09-30 DIAGNOSIS — N3090 Cystitis, unspecified without hematuria: Secondary | ICD-10-CM | POA: Diagnosis not present

## 2015-09-30 DIAGNOSIS — R102 Pelvic and perineal pain: Secondary | ICD-10-CM

## 2015-09-30 DIAGNOSIS — N949 Unspecified condition associated with female genital organs and menstrual cycle: Secondary | ICD-10-CM | POA: Diagnosis not present

## 2015-09-30 DIAGNOSIS — N979 Female infertility, unspecified: Secondary | ICD-10-CM | POA: Diagnosis not present

## 2015-09-30 DIAGNOSIS — G4700 Insomnia, unspecified: Secondary | ICD-10-CM | POA: Diagnosis not present

## 2015-09-30 DIAGNOSIS — R519 Headache, unspecified: Secondary | ICD-10-CM | POA: Insufficient documentation

## 2015-09-30 DIAGNOSIS — G8929 Other chronic pain: Secondary | ICD-10-CM | POA: Diagnosis not present

## 2015-09-30 LAB — POCT URINALYSIS DIPSTICK
Bilirubin, UA: NEGATIVE
Blood, UA: NEGATIVE
Glucose, UA: NEGATIVE
Ketones, UA: NEGATIVE
Leukocytes, UA: NEGATIVE
Nitrite, UA: NEGATIVE
Protein, UA: NEGATIVE
Spec Grav, UA: 1.025
Urobilinogen, UA: NEGATIVE
pH, UA: 6

## 2015-09-30 MED ORDER — AMITRIPTYLINE HCL 10 MG PO TABS: 10.0000 mg | ORAL_TABLET | Freq: Every day | ORAL | Status: DC

## 2015-09-30 MED ORDER — SULFAMETHOXAZOLE-TRIMETHOPRIM 800-160 MG PO TABS: 1.0000 | ORAL_TABLET | Freq: Two times a day (BID) | ORAL | Status: DC

## 2015-09-30 NOTE — Progress Notes (Signed)
Subjective: Chief Complaint  Patient presents with  . Follow-up    for bladder infection. said that she is still having medication and extremly nauseous. said that the medication is making her nauseous. said her frequesncy hasnt changed. someone was in restroom will get sample when they exit   Here for several concerns.  Was seen by Urgent Care recently for UTI, was sent to the ED.   Was put on Macrobid.   Been taking this BID x 4 days,  Has 1 more day left, and although improved, still has urinary frequency, pelvic pain, back pain and headache.  Denies vaginal discharge, no concern for STD.  In general she c/o lately having headaches, problems sleeping due to stress.   She is seeing gynecology for fertility problems, worries about this, has chronic pelvic pain and was advised by gyn to have surgery for endometriosis but doesn't want to do this.   She is tired of being in pain all the time.   She hasn't been sleeping well, worries about her health, worries about her job.   Has job [employer] but doesn't enjoy the work.  Is looking for other work.  Gets frequent headaches lasting for hours to days, usually frontal, but sometimes upper back, gets photophobia, phonophobia, but no paraesthesias, no vision or hearing changes otherwise.   No other aggravating or relieving factors. No other complaint.  Past Medical History  Diagnosis Date  . Anxiety   . Sickle cell trait ([place])   . Endometriosis   . History of trichomoniasis   . History of pelvic inflammatory disease   . Cyst of right ovary   . History of ovarian cyst     12-25-2013  s/p removal left ovarian endometrioma   ROS as in subjective   Objective: BP 104/68 mmHg  Pulse 60  Wt 233 lb (105.688 kg)  LMP 09/14/2015 (Exact Date)  General appearance: alert, WD/WN Psych: crying at times, seems down at times HEENT: normocephalic, sclerae anicteric, TMs pearly, nares patent, no discharge or erythema, pharynx normal Oral cavity: MMM, no  lesions Neck: supple, no lymphadenopathy, no thyromegaly, no masses Heart: RRR, normal S1, S2, no murmurs Lungs: CTA bilaterally, no wheezes, rhonchi, or rales Abdomen: +bs, soft, mild generalized lower abdominal tenderness, otherwise non tender, non distended, no masses, no hepatomegaly, no splenomegaly Pulses: 2+ symmetric, upper and lower extremities, normal cap refill Ext: no edema Neuro: CN2-12 intact, non focal exam   Assessment: Encounter Diagnoses  Name Primary?  . Bladder infection Yes  . Frequent headaches   . Generalized anxiety disorder   . Insomnia   . Chronic pelvic pain in female   . Female fertility problem     Plan: Bladder infection - finish Macrobid then use 5-7 days of Bactrim, hydrate well, and plan f/u in 2 wk Frequent headaches - begin trial of Amitriptyline.  discussed risks/benefits of medication, preventative measures, and headaches problem combination of migraines vs stress related headaches Anxiety - advised she c/t counseling, counseled on ways to cope with anxiety, worry insomnia - discussed sleep hygiene, begin trial of Amitriptyline Female fertility problems - managed by gyn F/u in 2-3 wk

## 2016-02-21 IMAGING — CT CT ABD-PELV W/ CM
1 of 3 series · 14 of 32 positions shown, 19 images · IV contrast (OMNIPAQUE)
Comparison: Ultrasound 01/30/2015, CT 12/05/2013

CLINICAL DATA: Peritoneal abscess. Laparoscopic ovarian cystectomy
and myomectomy.

EXAM:
CT ABDOMEN AND PELVIS WITH CONTRAST
TECHNIQUE: Multidetector CT imaging of the abdomen and pelvis was performed
using the standard protocol following bolus administration of
intravenous contrast.
CONTRAST:  100mL OMNIPAQUE IOHEXOL 300 MG/ML  SOLN

[Series 3: routine abdomen/pelvis with · axial · 0.87mm/px · z∈[-56,+299]mm · 14 of 83 slices shown, 19 images]
[im 6/83  soft-tissue]
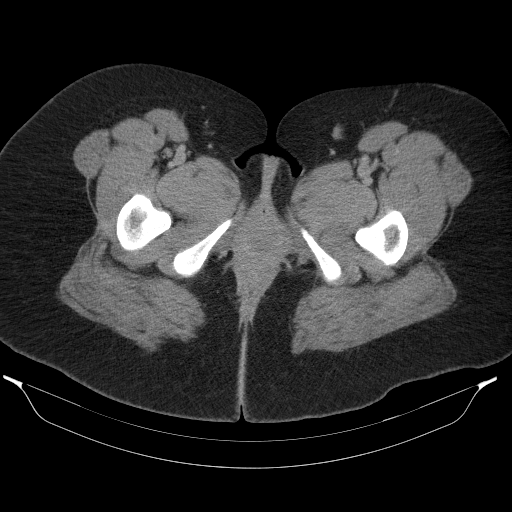
[im 6/83  bone]
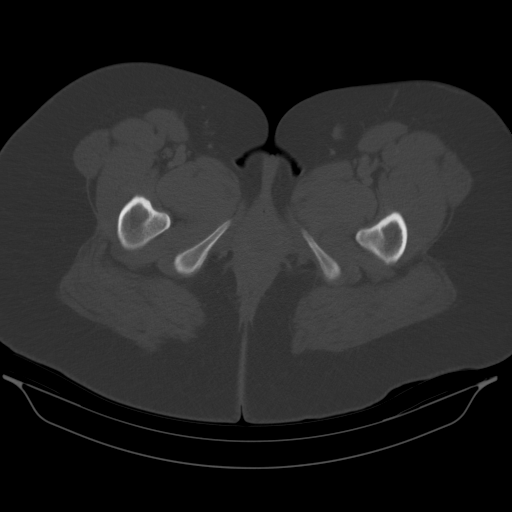
[im 11/83  soft-tissue]
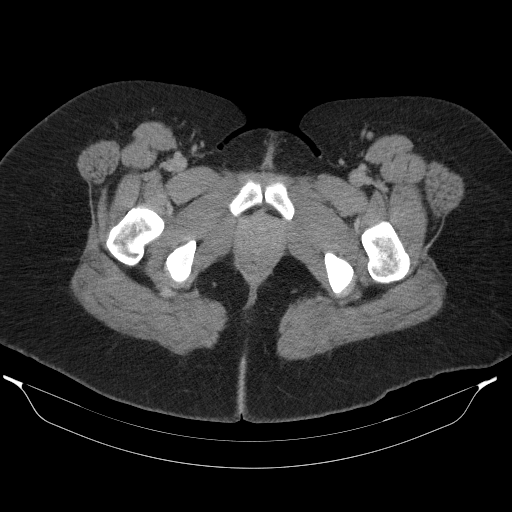
[im 16/83  soft-tissue]
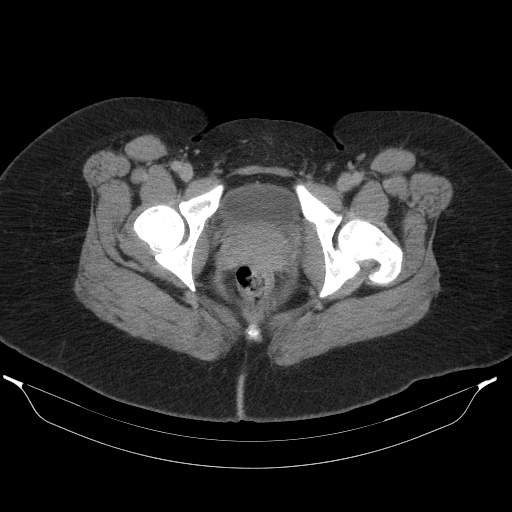
[im 26/83  soft-tissue]
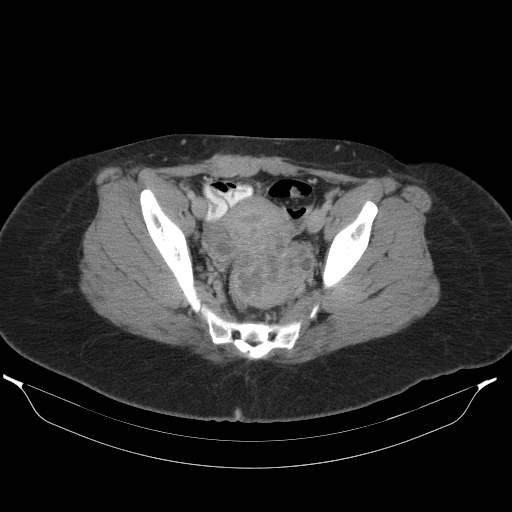
[im 31/83  soft-tissue]
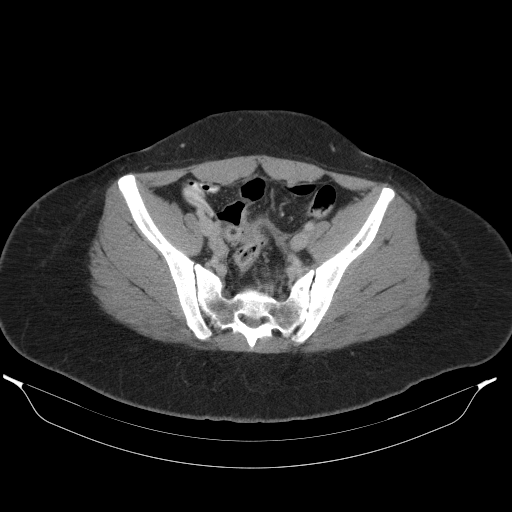
[im 36/83  soft-tissue]
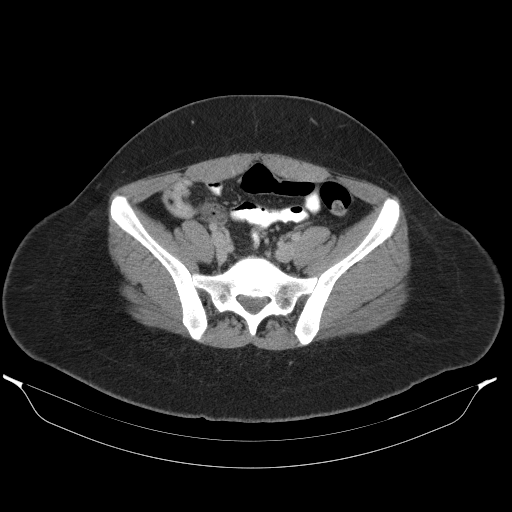
[im 42/83  soft-tissue]
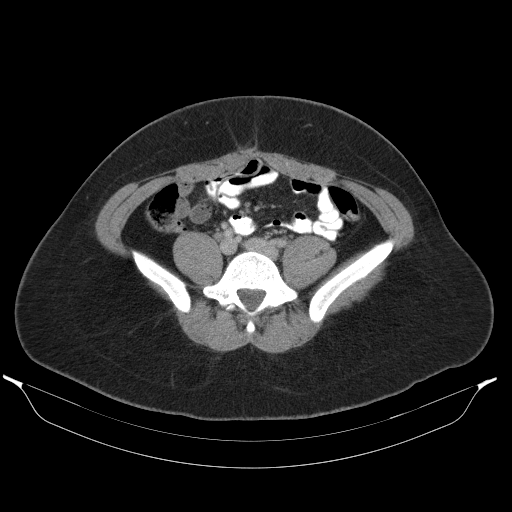
[im 47/83  soft-tissue]
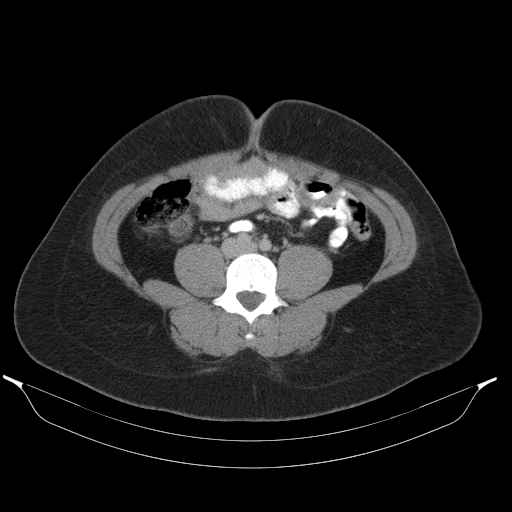
[im 52/83  soft-tissue]
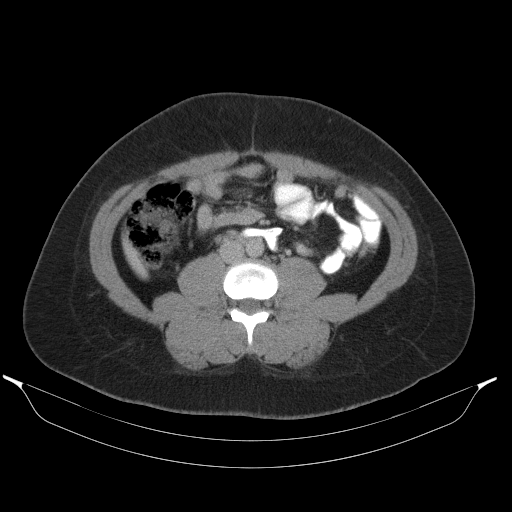
[im 52/83  bone]
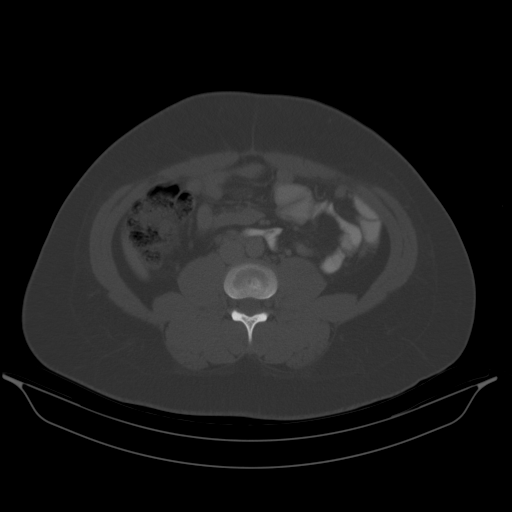
[im 57/83  soft-tissue]
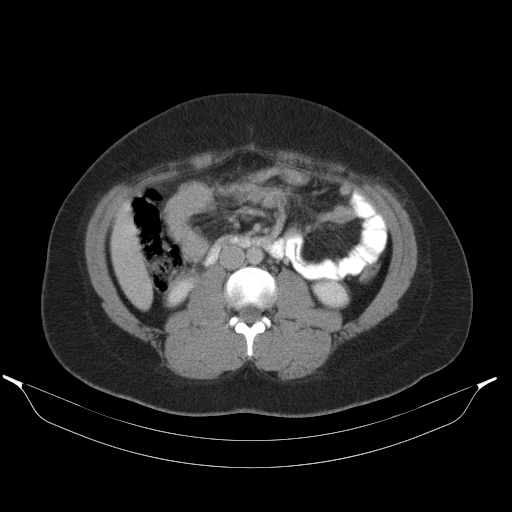
[im 62/83  lung]
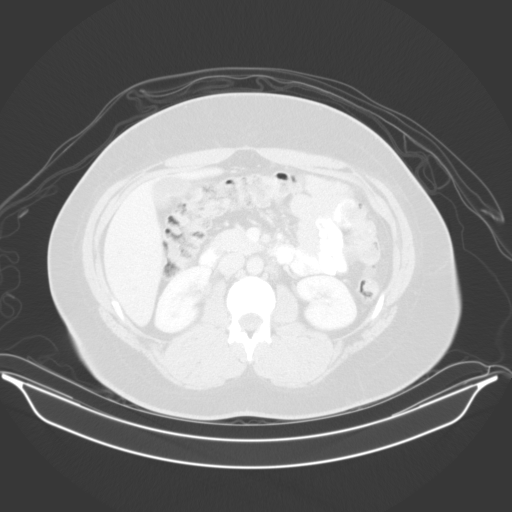
[im 67/83  soft-tissue]
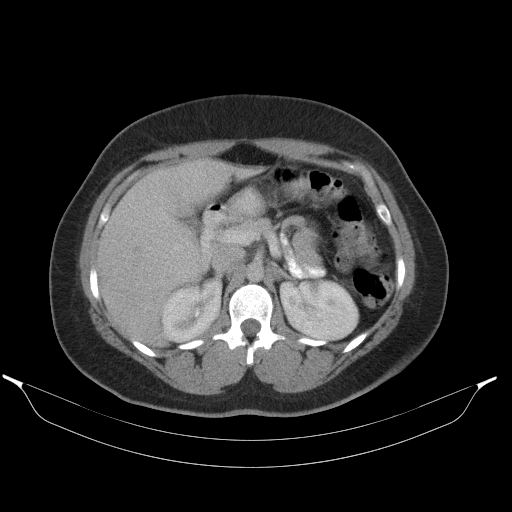
[im 67/83  lung]
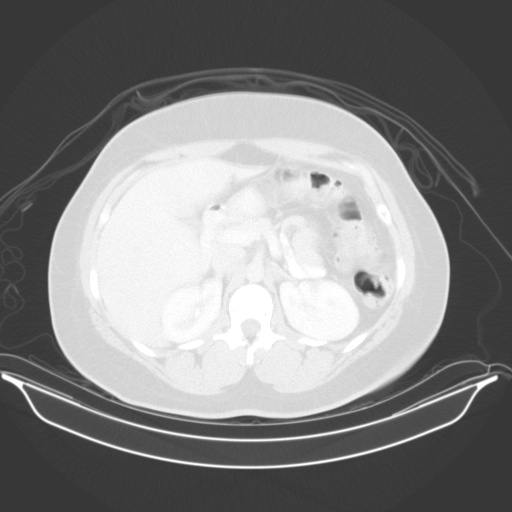
[im 72/83  soft-tissue]
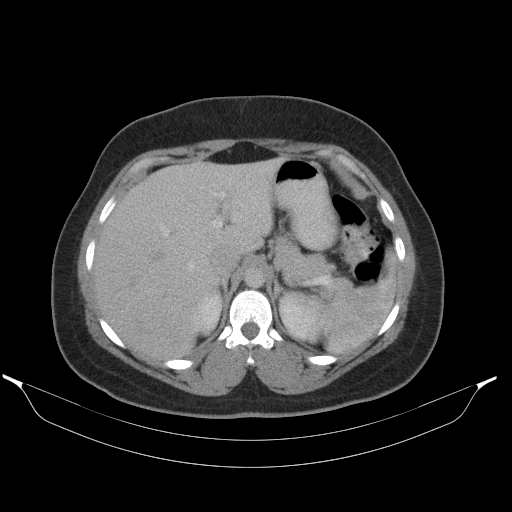
[im 72/83  lung]
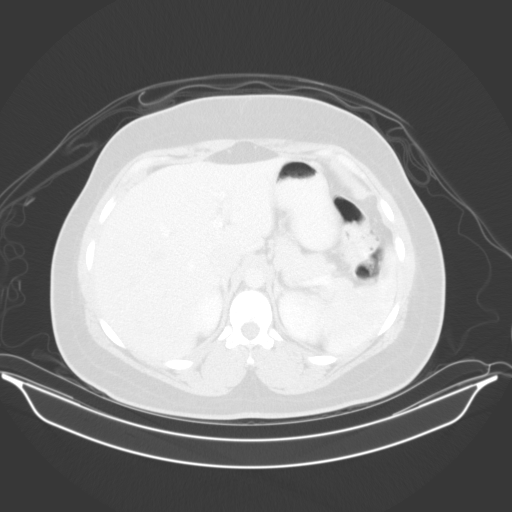
[im 77/83  soft-tissue]
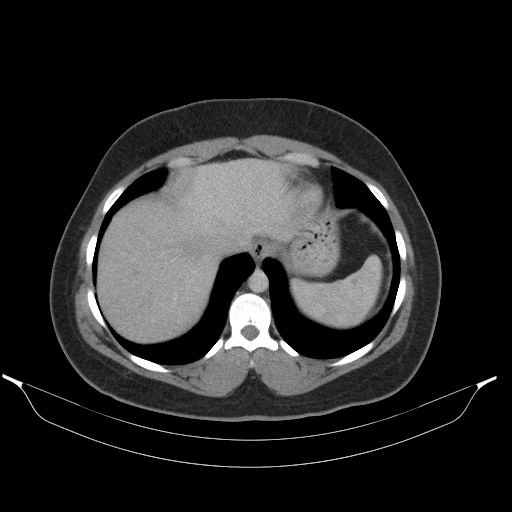
[im 77/83  lung]
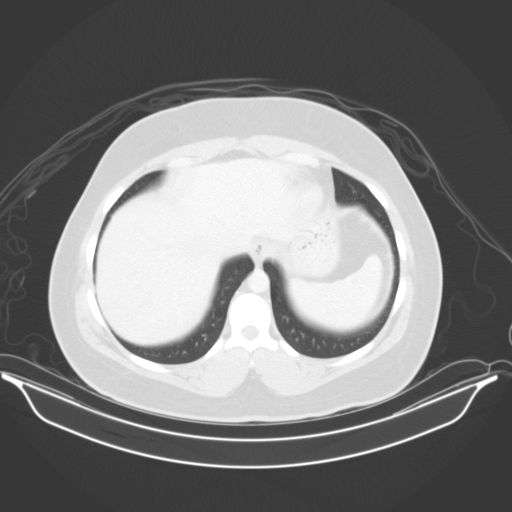

[14 of 32 positions shown; findings below may reference images not displayed]

FINDINGS: Lower chest: Lung bases are clear.

Hepatobiliary: No focal hepatic lesion. No biliary duct dilatation.
Gallbladder is normal. Common bile duct is normal.

Pancreas: Pancreas is normal. No ductal dilatation. No pancreatic
inflammation.

Spleen: Normal spleen

Adrenals/urinary tract: Adrenal glands are normal. No enhancing
cortical lesion. No filling defects within systems ureters. Ureters
appear normal. Bladder is normal.

Stomach/Bowel: Stomach, small bowel, appendix, and cecum are normal.
The colon and rectosigmoid colon are normal.

Vascular/Lymphatic: Abdominal aorta is normal caliber. There is no
retroperitoneal or periportal lymphadenopathy. No pelvic
lymphadenopathy.

Reproductive: Mass extending along the posterior wall of the uterus
measuring 7.5 by 4.5 cm (image 58, series 3). This mass appears
contiguous with the myometrium through central bridging tissue
measuring 2 cm on image 58, series 3. This is most consistent with a
pedunculated leiomyoma exophytic to the posterior wall. This mass
has heterogeneous in enhancement.

The RIGHT ovary appears normal measuring 3.9 by 2.3 x 3.7 cm. The
LEFT ovary is surgically absent.

There is subcutaneous stranding along the midline superior pubic
symphysis consists with prior abdominal surgery. No evidence of
abscess tin the pelvis or peritoneal space.

Musculoskeletal: No aggressive osseous lesion.

Other: No free fluid.
IMPRESSION: 1. No evidence of pelvic abscess.
2. Large pedunculated leiomyoma extending from the posterior wall of
the uterus.
3. Normal RIGHT ovary.  LEFT ovary surgically absent.

## 2016-06-02 ENCOUNTER — Encounter: Payer: Self-pay | Admitting: Family Medicine

## 2016-06-02 ENCOUNTER — Ambulatory Visit (INDEPENDENT_AMBULATORY_CARE_PROVIDER_SITE_OTHER): Payer: 59 | Admitting: Family Medicine

## 2016-06-02 VITALS — BP 122/72 | HR 92 | Ht 65.0 in | Wt 236.6 lb

## 2016-06-02 DIAGNOSIS — N898 Other specified noninflammatory disorders of vagina: Secondary | ICD-10-CM | POA: Diagnosis not present

## 2016-06-02 DIAGNOSIS — Z202 Contact with and (suspected) exposure to infections with a predominantly sexual mode of transmission: Secondary | ICD-10-CM | POA: Diagnosis not present

## 2016-06-02 LAB — POCT WET PREP (WET MOUNT)
Clue Cells Wet Prep Whiff POC: NEGATIVE
Trichomonas Wet Prep HPF POC: ABSENT

## 2016-06-02 NOTE — Patient Instructions (Addendum)
I did not see any evidence of yeast, BV or other STD on today's exam. Please call us if the discharge worsens (and let us know if creamy and worsening odor, vs thick white with worsening itching). We will be in touch through MyChart with the blood test results.     Feel free to check with your insurance to see if they will pay for the 3rd Gardisil (to complete your series).  If covered, we would be happy to give you the 3rd and final shot.  We are checking for IgG for both HSV types 1 and 2  Herpes Simplex Test There are two common types of herpes simplex virus (HSV). These are classified as Type 1 (HSV1) or Type 2 (HSV2). Type 1 often causes cold sores on or around the mouth and sometimes on or around the eyes. Type 2 is commonly known as a sexually transmitted infection that causes sores in and around the genitals. Both types of herpes simplex can cause sores in different areas. There are two types of herpes simplex tests. These include:  Culture. This consists of collecting and testing a sample of fluid with a cotton swab from an open sore. This can only be done during an active infection (outbreak). Culture tests take several days to complete but are very accurate.  HSV blood tests. This test is not as accurate as a culture. However, HSV blood tests are faster than cultures, often providing a test result within one day.  HSV antibody test. This checks for the presence of antibodies against HSV in your blood. Antibodies are proteins your body makes to help fight infection.  HSV antigen test. This checks for the presence of the HSV germ (antigen) in your blood. Your health care provider may recommend you have a HSV test if:  He or she believes you have a HSV infection.  You have a weakened immune system (immunocompromised) and you have sores around your mouth or genitals that look like HSV eruptions.  You have a fever of unknown origin (FUO).  You are pregnant, have herpes, and are  expecting to deliver a baby vaginally in the next 6-8 weeks. RESULTS It is your responsibility to obtain your test results. Ask the lab or department performing the test when and how you will get your results. Contact your health care provider to discuss any questions you have about your results. Range of Normal Values Ranges for normal values may vary among different labs and hospitals. You should always check with your health care provider after having lab work or other tests done to discuss whether your values are considered within normal limits. Normal findings include:  No HSV antigen or antibodies present in your blood.  No HSV antigen present in cultured fluid. Meaning of Results Outside Normal Ranges The following test results may indicate that you have an active HSV infection:  Positive culture for HSV1 or HSV2.  Presence of HSV1 or HSV2 antigens in your blood.  Presence of certain HSV1 or HSV2 antibodies (IgM) in your blood. Discuss your test results with your health care provider. He or she will use the results to make a diagnosis and determine a treatment plan that is right for you.   This information is not intended to replace advice given to you by your health care provider. Make sure you discuss any questions you have with your health care provider.   Document Released: 10/22/2004 Document Revised: 10/10/2014 Document Reviewed: 02/04/2014 Elsevier Interactive Patient Education [employer].

## 2016-06-02 NOTE — Progress Notes (Signed)
Chief Complaint  Patient presents with  . Exposure to STD    white creamy discharge that is sometimes itchy and has a fishy odor. Has some pelvic pain as well x 2 weeks. Partner called her yesterday and told her that he tested postitive for genital herpes and she would like to be checked-has no sores today.   . Flu Vaccine    declined vaccine.   Patient presents with complaint of creamy discharge for 1-1.5 weeks. Slight fishy odor. She has h/o BV and yeast many times in the past. Symptoms aren't as bad as in the past Some mild itching, not daily.  She just had a filling today at the dentist (and lip is still numb)--no antibiotics  She then reports that her main concern is that for STD testing, and questions regarding herpes.  She has been seeing infertility doctor. They sent her partner to a doctor for sperm check, and was told about +herpes blood test. No known outbreaks in partner or patient. She feels like he is saying she gave it to him, which she denies, and is very concerned about this. She would like to be tested. She denies any cold sores or history of any outbreaks anywhere.  She has endometriosis, with some intermittent pelvic pain. She reports being told she has a fibroid, and ovarian cyst. Denies pelvic pain currently, or urinary complaints.  Review of chart shows that her last STD check was 09/2015--neg RPR, negative Hep B Surface Ag, negative Hep C Ab, negative HIV.   Wet prep +for BV at that time.  Only had 2 gardisil, now > 55 yo  PMH, PSH, SH reviewed H/o trich noted in [place] in chart as well as what was discussed above.  Outpatient Encounter Prescriptions as of 06/02/2016  Medication Sig Note  . b complex vitamins tablet Take 1 tablet by mouth daily.   . norethindrone (AYGESTIN) 5 MG tablet Take 2.5 mg by mouth daily.   . [DISCONTINUED] amitriptyline (ELAVIL) 10 MG tablet Take 1 tablet (10 mg total) by mouth at bedtime.   Marland Kitchen ibuprofen (ADVIL,MOTRIN) 800 MG tablet Take 1  tablet (800 mg total) by mouth every 8 (eight) hours as needed. (Patient not taking: Reported on 06/02/2016)   . naproxen (NAPROSYN) 500 MG tablet Take 1 tablet (500 mg total) by mouth 2 (two) times daily with a meal. As needed for pain (Patient not taking: Reported on 06/02/2016) 09/30/2015: Takes prn  . [DISCONTINUED] metroNIDAZOLE (FLAGYL) 500 MG tablet Take 1 tablet (500 mg total) by mouth 2 (two) times daily. (Patient not taking: Reported on 09/30/2015)   . [DISCONTINUED] ondansetron (ZOFRAN) 4 MG tablet Take 1 tablet (4 mg total) by mouth every 8 (eight) hours as needed for nausea or vomiting. (Patient not taking: Reported on 09/09/2015)   . [DISCONTINUED] phenazopyridine (PYRIDIUM) 200 MG tablet Take 1 tablet (200 mg total) by mouth 3 (three) times daily. (Patient not taking: Reported on 09/30/2015)   . [DISCONTINUED] sulfamethoxazole-trimethoprim (BACTRIM DS,SEPTRA DS) 800-160 MG tablet Take 1 tablet by mouth 2 (two) times daily.   . [DISCONTINUED] traMADol (ULTRAM) 50 MG tablet Take 50 mg by mouth every 6 (six) hours as needed. Reported on 09/30/2015    No facility-administered encounter medications on file as of 06/02/2016.    No Known Allergies  ROS: no fever, chills, urinary complaints, pelvic pain, skin lesions/sores.  See HPI.  PHYSICAL EXAM: BP 122/72 (BP Location: Left Arm, Patient Position: Sitting, Cuff Size: Normal)   Pulse 92  Ht 5\' 5"  (1.651 m)   Wt 236 lb 9.6 oz (107.3 kg)   LMP 05/12/2016   BMI 39.37 kg/m   Well developed, pleasant female in no distress Some decreased motion and slight swelling of upper lip, affecting her speech (from dental work earlier today). Abdomen: Very mild suprapubic tenderness, no mass, rebound or guarding (chronic, unchanged per pt) External genitalia is normal without lesions BUS and vagina normal, cervix is normal without lesions.  There is a small amount of thing white vaginal discharge, no odor.  Wet prep: some rods, no clue cells, no  PMN's, no trich, no yeast.  Negative whiff test  ASSESSMENT/PLAN:  Exposure to STD - Plan: HIV antibody, GC/Chlamydia Probe Amp, RPR, HSV 1 antibody, IgG, HSV 2 antibody, IgG  Vaginal discharge - Plan: POCT Wet Prep [facility])  No evidence of BV or yeast.  Will contact us if worsening/persisting discharge symptoms.  She is due for cycle soon, so may just be physiologic, and she is anxious over STD's related to worries over HSV exposure and her partner.  STD testing today HIV, RPR, GC/chlamydia,  (thinks she had Hep B vaccines, ag neg in December ,not repeating) HSV 1 and 2 studies (IgG)--discussed in detail meaning of the HSV tests (for what to expect with her results, and how to interpret boyfriend's--not necessarily meaning that he cheated, that exposure could have been a long time ago).  (Prefers vaginal gel rather than oral flagyl if needed for BV in future.  Risks/side effects of flagyl reviewed, in case rx needed if worsening odor/discharge)   All questions answered.  Spent at least 30 minutes, more than 1/2 spent counseling.    Feel free to check with your insurance to see if they will pay for the 3rd Gardisil (to complete your series).  If covered, we would be happy to give you the 3rd and final shot.

## 2016-06-03 LAB — HIV ANTIBODY (ROUTINE TESTING W REFLEX): HIV 1&2 Ab, 4th Generation: NONREACTIVE

## 2016-06-03 LAB — RPR

## 2016-06-03 LAB — GC/CHLAMYDIA PROBE AMP
CT Probe RNA: NOT DETECTED
GC Probe RNA: NOT DETECTED

## 2016-06-03 LAB — HSV 1 ANTIBODY, IGG: HSV 1 Glycoprotein G Ab, IgG: 38.9 Index — ABNORMAL HIGH (ref <0.90)

## 2016-06-03 LAB — HSV 2 ANTIBODY, IGG: HSV 2 Glycoprotein G Ab, IgG: <0.9 Index (ref <0.90)

## 2016-06-07 ENCOUNTER — Telehealth: Payer: Self-pay | Admitting: Medical

## 2016-06-07 NOTE — Telephone Encounter (Signed)
Pt requesting refill on med that Audelia Acton gave her for excessive sweating. Pt thinks the name of the med is Hypercare and she uses this as needed.

## 2016-08-30 ENCOUNTER — Encounter: Payer: Self-pay | Admitting: Medical

## 2016-08-30 ENCOUNTER — Ambulatory Visit (INDEPENDENT_AMBULATORY_CARE_PROVIDER_SITE_OTHER): Payer: 59 | Admitting: Medical

## 2016-08-30 VITALS — BP 128/74 | HR 88 | Temp 98.5°F | Wt 249.0 lb

## 2016-08-30 DIAGNOSIS — J069 Acute upper respiratory infection, unspecified: Secondary | ICD-10-CM | POA: Diagnosis not present

## 2016-08-30 DIAGNOSIS — R894 Abnormal immunological findings in specimens from other organs, systems and tissues: Secondary | ICD-10-CM | POA: Diagnosis not present

## 2016-08-30 DIAGNOSIS — N809 Endometriosis, unspecified: Secondary | ICD-10-CM | POA: Diagnosis not present

## 2016-08-30 MED ORDER — NAPROXEN 500 MG PO TABS: 500.0000 mg | ORAL_TABLET | Freq: Two times a day (BID) | ORAL | 0 refills | Status: DC

## 2016-08-30 MED ORDER — AMOXICILLIN 875 MG PO TABS: 875.0000 mg | ORAL_TABLET | Freq: Two times a day (BID) | ORAL | 0 refills | Status: DC

## 2016-08-30 NOTE — Progress Notes (Signed)
Subjective: Chief Complaint  Patient presents with  . coughing , headaches    coughing headaches, runny nose    Here for cough, congestion, sore throat, off and on fever since Thanksgiving.  Ears feel clogged.   Fever has been subjective.  No NVD, no wheezing or SOB.   Using hot tea and tylenol.   Has several sick contacts.  No recent travel  No other aggravating or relieving factors.   She has questions about HSV test from last visit.  No hx/o cold sore or genital herpes.   From time to time will get a mouth ulcer in side the mouth.  No other complaint  Past Medical History:  Diagnosis Date  . Anxiety   . Cyst of right ovary   . Endometriosis   . History of ovarian cyst    12-25-2013  s/p removal left ovarian endometrioma  . History of pelvic inflammatory disease   . History of trichomoniasis   . Sickle cell trait [facility])     Current Outpatient Prescriptions on File Prior to Visit  Medication Sig Dispense Refill  . ibuprofen (ADVIL,MOTRIN) 800 MG tablet Take 1 tablet (800 mg total) by mouth every 8 (eight) hours as needed. (Patient not taking: Reported on 08/30/2016) 60 tablet 1  . norethindrone (AYGESTIN) 5 MG tablet Take 2.5 mg by mouth daily.     No current facility-administered medications on file prior to visit.    ROS as in subjective   Objective BP 128/74   Pulse 88   Temp 98.5 F (36.9 C)   Wt 249 lb (112.9 kg)   SpO2 97%   BMI 41.44 kg/m   General appearance: alert, no distress, WD/WN, mildly ill appearing HEENT: normocephalic, sclerae anicteric, conjunctiva pink and moist, TMs pearly, nares patent, no discharge or erythema, pharynx normal, tonsils unremarkable Oral cavity: MMM, no lesions, no ulcers inside mouth or lips Neck: supple, no lymphadenopathy, no thyromegaly, no masses Lungs: CTA bilaterally, no wheezes, rhonchi, or rales      Assessment: Encounter Diagnoses  Name Primary?  . Acute upper respiratory infection Yes  . Herpes simplex antibody  positive   . Endometriosis      Plan: URI - advised rest, hydration, and begin OTC dayquil/Nyquil. If not much improved in 2-3 days, then can begin amoxicillin, but currently exam and symptoms suggest viral URI  Reviewed her HSV 1+ IgG lab but she has no hx/o clinical outbreaks.  Discussed lab result, symptoms that would suggest clinical manifestation of herpes, prevention, treamtment  F/u prn.

## 2016-10-21 ENCOUNTER — Telehealth: Payer: Self-pay | Admitting: Medical

## 2016-10-21 ENCOUNTER — Other Ambulatory Visit: Payer: Self-pay | Admitting: Medical

## 2016-10-21 DIAGNOSIS — N809 Endometriosis, unspecified: Secondary | ICD-10-CM

## 2016-10-21 NOTE — Telephone Encounter (Signed)
Lets get her in for physical

## 2016-10-25 NOTE — Telephone Encounter (Signed)
Called pt and left message for her to call back to schedule CPE

## 2016-10-27 ENCOUNTER — Ambulatory Visit (INDEPENDENT_AMBULATORY_CARE_PROVIDER_SITE_OTHER): Payer: 59 | Admitting: Medical

## 2016-10-27 ENCOUNTER — Encounter: Payer: Self-pay | Admitting: Medical

## 2016-10-27 ENCOUNTER — Other Ambulatory Visit (HOSPITAL_COMMUNITY)
Admission: RE | Admit: 2016-10-27 | Discharge: 2016-10-27 | Disposition: A | Discharge status: Home / Self Care | Payer: 59 | Source: Ambulatory Visit | Attending: Medical | Admitting: Medical

## 2016-10-27 VITALS — BP 130/74 | HR 86 | Ht 65.5 in | Wt 245.6 lb

## 2016-10-27 DIAGNOSIS — R4589 Other symptoms and signs involving emotional state: Secondary | ICD-10-CM | POA: Insufficient documentation

## 2016-10-27 DIAGNOSIS — E669 Obesity, unspecified: Secondary | ICD-10-CM | POA: Diagnosis not present

## 2016-10-27 DIAGNOSIS — N898 Other specified noninflammatory disorders of vagina: Secondary | ICD-10-CM | POA: Diagnosis not present

## 2016-10-27 DIAGNOSIS — Z0000 Encounter for general adult medical examination without abnormal findings: Secondary | ICD-10-CM | POA: Diagnosis not present

## 2016-10-27 DIAGNOSIS — Z3041 Encounter for surveillance of contraceptive pills: Secondary | ICD-10-CM | POA: Diagnosis not present

## 2016-10-27 DIAGNOSIS — Z113 Encounter for screening for infections with a predominantly sexual mode of transmission: Secondary | ICD-10-CM | POA: Diagnosis present

## 2016-10-27 DIAGNOSIS — Z2821 Immunization not carried out because of patient refusal: Secondary | ICD-10-CM | POA: Diagnosis not present

## 2016-10-27 DIAGNOSIS — Z01419 Encounter for gynecological examination (general) (routine) without abnormal findings: Secondary | ICD-10-CM | POA: Insufficient documentation

## 2016-10-27 DIAGNOSIS — F329 Major depressive disorder, single episode, unspecified: Secondary | ICD-10-CM

## 2016-10-27 DIAGNOSIS — Z1151 Encounter for screening for human papillomavirus (HPV): Secondary | ICD-10-CM | POA: Insufficient documentation

## 2016-10-27 DIAGNOSIS — L299 Pruritus, unspecified: Secondary | ICD-10-CM

## 2016-10-27 DIAGNOSIS — Z23 Encounter for immunization: Secondary | ICD-10-CM | POA: Diagnosis not present

## 2016-10-27 DIAGNOSIS — N809 Endometriosis, unspecified: Secondary | ICD-10-CM

## 2016-10-27 DIAGNOSIS — F4310 Post-traumatic stress disorder, unspecified: Secondary | ICD-10-CM | POA: Diagnosis not present

## 2016-10-27 DIAGNOSIS — Z124 Encounter for screening for malignant neoplasm of cervix: Secondary | ICD-10-CM | POA: Diagnosis not present

## 2016-10-27 DIAGNOSIS — B86 Scabies: Secondary | ICD-10-CM

## 2016-10-27 LAB — POCT WET PREP (WET MOUNT)
KOH Wet Prep POC: NEGATIVE
WBC, Wet Prep HPF POC: NEGATIVE

## 2016-10-27 LAB — COMPREHENSIVE METABOLIC PANEL
ALT: 14 U/L (ref 6–29)
AST: 18 U/L (ref 10–30)
Albumin: 4.1 g/dL (ref 3.6–5.1)
Alkaline Phosphatase: 50 U/L (ref 33–115)
BUN: 8 mg/dL (ref 7–25)
CO2: 22 mmol/L (ref 20–31)
Calcium: 9.6 mg/dL (ref 8.6–10.2)
Chloride: 105 mmol/L (ref 98–110)
Creat: 0.86 mg/dL (ref 0.50–1.10)
Glucose, Bld: 67 mg/dL (ref 65–99)
Potassium: 4 mmol/L (ref 3.5–5.3)
Sodium: 139 mmol/L (ref 135–146)
Total Bilirubin: 0.4 mg/dL (ref 0.2–1.2)
Total Protein: 6.8 g/dL (ref 6.1–8.1)

## 2016-10-27 LAB — POCT URINE PREGNANCY: Preg Test, Ur: NEGATIVE

## 2016-10-27 LAB — POCT URINALYSIS DIPSTICK
Bilirubin, UA: NEGATIVE
Blood, UA: NEGATIVE
Glucose, UA: NEGATIVE
Ketones, UA: NEGATIVE
Leukocytes, UA: NEGATIVE
Nitrite, UA: NEGATIVE
Protein, UA: NEGATIVE
Spec Grav, UA: 1.025
Urobilinogen, UA: NEGATIVE
pH, UA: 6

## 2016-10-27 LAB — TSH: TSH: 1.43 mIU/L

## 2016-10-27 LAB — CBC
HCT: 40.7 % (ref 35.0–45.0)
Hemoglobin: 13.2 g/dL (ref 11.7–15.5)
MCH: 25.3 pg — ABNORMAL LOW (ref 27.0–33.0)
MCHC: 32.4 g/dL (ref 32.0–36.0)
MCV: 78 fL — ABNORMAL LOW (ref 80.0–100.0)
MPV: 10.8 fL (ref 7.5–12.5)
Platelets: 340 10*3/uL (ref 140–400)
RBC: 5.22 MIL/uL — ABNORMAL HIGH (ref 3.80–5.10)
RDW: 14 % (ref 11.0–15.0)
WBC: 9.6 10*3/uL (ref 4.0–10.5)

## 2016-10-27 MED ORDER — PERMETHRIN 5 % EX CREA: 1.0000 "application " | TOPICAL_CREAM | Freq: Once | CUTANEOUS | 0 refills | Status: AC

## 2016-10-27 MED ORDER — NORETHINDRONE ACETATE 5 MG PO TABS: 2.5000 mg | ORAL_TABLET | Freq: Every day | ORAL | 11 refills | Status: DC

## 2016-10-27 MED ORDER — BUPROPION HCL ER (XL) 150 MG PO TB24: 150.0000 mg | ORAL_TABLET | Freq: Every day | ORAL | 2 refills | Status: DC

## 2016-10-27 MED ORDER — METRONIDAZOLE 500 MG PO TABS: 500.0000 mg | ORAL_TABLET | Freq: Three times a day (TID) | ORAL | 0 refills | Status: DC

## 2016-10-27 MED ORDER — NAPROXEN 500 MG PO TABS: 500.0000 mg | ORAL_TABLET | Freq: Two times a day (BID) | ORAL | 0 refills | Status: DC

## 2016-10-27 MED ORDER — FLUCONAZOLE 150 MG PO TABS: ORAL_TABLET | ORAL | 1 refills | Status: DC

## 2016-10-27 NOTE — Progress Notes (Signed)
Subjective:   HPI  Frances Hill is a 31 y.o. female who presents for a complete physical.  Medical care team includes:  Dr. Kerin Perna, fertility and specialty gynecology  [facility] for pelvic floor therapy  Massage therapy  Sees eye doctor and dentist  Chana Bode [facility], PA-C here for primary care  Sees therapist for past 6+ months   Concerns: Lately having few weeks of vaginal discharge and odor.  Does have new sexual partner, using condoms.   Wants to c/t same birth control.   Sees Dr. Kerin Perna for surgery/cyst/fertility issues, but wants pap today.  Not sure of name of OCP she is taking, but last filled by Dr. Kerin Perna.    Exercising 3 times per weeks, cardio, some weights.  Diet - healthy.     Has concerns about occasional mouth sore.   Has had intermittent ulcers in mouth rarely. Last boyfriend had hx/o HSV1 but no longer with him.   Has bumps in pubic region and thighs.  Has had people at her house recently/niece that had scabies.     Gets boils in pubic area from shaving from time to time.  Seeing therapist for months for depressed mood, PTSD.  He recommend she come in and be put on anti-depressant.  Reviewed their medical, surgical, family, social, medication, and allergy history and updated chart as appropriate.  Past Medical History:  Diagnosis Date  . Anxiety   . Cyst of right ovary   . Endometriosis   . History of ovarian cyst    12-25-2013  s/p removal left ovarian endometrioma  . History of pelvic inflammatory disease   . History of trichomoniasis   . Sickle cell trait [facility])     Past Surgical History:  Procedure Laterality Date  . CHROMOPERTUBATION  04/24/2015   Procedure: CHROMOPERTUBATION;  Surgeon: Governor Specking, MD;  Location: [facility];  Service: Gynecology;;  . LAPAROSCOPIC LYSIS OF ADHESIONS  04/24/2015   Procedure: LAPAROSCOPIC LYSIS OF ADHESIONS AND EXCISION ENDOMETRIOTIC IMPLANTS;  Surgeon: Governor Specking, MD;  Location: [place];  Service: Gynecology;;  . LAPAROSCOPIC OVARIAN CYSTECTOMY Right 04/24/2015   Procedure: LAPAROSCOPIC, RIGHT OVARIAN CYSTECTOMY,  GEL PORT ASSISTED MYOMECTOMY;  Surgeon: Governor Specking, MD;  Location: [place];  Service: Gynecology;  Laterality: Right;  . LAPAROSCOPY N/A 12/25/2013   Procedure: OPERATIVE LAPAROSCOPY;  Surgeon: Osborne Oman, MD;  Location: [facility];  Service: Gynecology;  Laterality: N/A;  . SALPINGOOPHORECTOMY Left 12/25/2013   Procedure: SALPINGO OOPHORECTOMY;  Surgeon: Osborne Oman, MD;  Location: [facility];  Service: Gynecology;  Laterality: Left;    Social History   Social History  . Marital status: Single    Spouse name: N/A  . Number of children: N/A  . Years of education: N/A   Occupational History  . Not on file.   Social History Main Topics  . Smoking status: Never Smoker  . Smokeless tobacco: Never Used  . Alcohol use Yes     Comment: occasionally  . Drug use: No  . Sexual activity: Yes    Birth control/ protection: None   Other Topics Concern  . Not on file   Social History Narrative   [employer], lives alone, no children, Education officer, museum at [employer], exercise - 3-4 times per week, walks 1-2 miles, weights at [employer], other cardio, diet is good, improved from 2014    Family History  Problem Relation Age of Onset  . Aneurysm Mother 84  died of aneurysm, brain  . Cancer Mother 60    breast, twice  . Hypertension Father   . Depression Father   . Heart disease Father     valve disease  . Other Father     DDD  . Other Brother     DDD  . Sarcoidosis Brother   . Cancer Maternal Aunt     breast cancer  . Diabetes Neg Hx   . Stroke Neg Hx      Current Outpatient Prescriptions:  .  naproxen (NAPROSYN) 500 MG tablet, Take 1 tablet (500 mg total) by mouth 2 (two) times daily with a meal. As needed for pain, Disp: 60 tablet, Rfl: 0 .  norethindrone  (AYGESTIN) 5 MG tablet, Take 0.5 tablets (2.5 mg total) by mouth daily. CANCEL, Disp: 28 tablet, Rfl: 11 .  buPROPion (WELLBUTRIN XL) 150 MG 24 hr tablet, Take 1 tablet (150 mg total) by mouth daily., Disp: 30 tablet, Rfl: 2 .  fluconazole (DIFLUCAN) 150 MG tablet, Once weekly, Disp: 3 tablet, Rfl: 1 .  metroNIDAZOLE (FLAGYL) 500 MG tablet, Take 1 tablet (500 mg total) by mouth 3 (three) times daily., Disp: 21 tablet, Rfl: 0 .  permethrin (ACTICIN) 5 % cream, Apply 1 application topically once., Disp: 60 g, Rfl: 0  No Known Allergies   Review of Systems Constitutional: -fever, -chills, -sweats, -unexpected weight change, -decreased appetite, -fatigue Allergy: -sneezing, +itching, -congestion Dermatology: -changing moles, --rash, -lumps ENT: -runny nose, -ear pain, -sore throat, -hoarseness, -sinus pain, -teeth pain, - ringing in ears, -hearing loss, -nosebleeds Cardiology: -chest pain, -palpitations, -swelling, -difficulty breathing when lying flat, -waking up short of breath Respiratory: -cough, -shortness of breath, -difficulty breathing with exercise or exertion, -wheezing, -coughing up blood Gastroenterology: -abdominal pain, -nausea, -vomiting, -diarrhea, -constipation, -blood in stool, -changes in bowel movement, -difficulty swallowing or eating Hematology: -bleeding, -bruising  Musculoskeletal: -joint aches, -muscle aches, -joint swelling, -back pain, -neck pain, -cramping, -changes in gait Ophthalmology: denies vision changes, eye redness, itching, discharge Urology: -burning with urination, -difficulty urinating, -blood in urine, -urinary frequency, -urgency, -incontinence Neurology: -headache, -weakness, -tingling, -numbness, -memory loss, -falls, -dizziness Psychology: +depressed mood, -agitation, +sleep problems      Objective:   Physical Exam  BP 130/74   Pulse 86   Ht 5' 5.5" (1.664 m)   Wt 245 lb 9.6 oz (111.4 kg)   LMP 10/07/2016   SpO2 98%   BMI 40.25 kg/m   Wt  Readings from Last 3 Encounters:  10/27/16 245 lb 9.6 oz (111.4 kg)  08/30/16 249 lb (112.9 kg)  06/02/16 236 lb 9.6 oz (107.3 kg)   General appearance:pleasant, alert, no distress, WD/WN, obese, AA female  Skin: few raised papular irritated lesions in pubic region and right upper thigh likely scabies.  otherwise unremarkable, no worrisome lesions, left wrist distally with tattoo of cross with EKG with heart HEENT: normocephalic, conjunctiva/corneas normal, sclerae anicteric, PERRLA, EOMi, nares patent, no discharge or erythema, pharynx normal  Oral cavity: MMM, tongue normal, teeth normal appearing  Neck: supple, no lymphadenopathy, no thyromegaly, no masses, normal ROM, no bruits  Chest: non tender, normal shape and expansion  Heart: RRR, normal S1, S2, no murmurs  Lungs: CTA bilaterally, no wheezes, rhonchi, or rales  Abdomen: +bs, soft, surgical scars noted, non tender, non distended, no masses, no hepatomegaly, no splenomegaly, no bruits  Back: low back tattoo, non tender, normal ROM, no scoliosis  Musculoskeletal: upper extremities non tender, no obvious deformity, normal ROM throughout, lower extremities  non tender, no obvious deformity, normal ROM throughout  Extremities: no edema, no cyanosis, no clubbing  Pulses: 2+ symmetric, upper and lower extremities, normal cap refill  Neurological: alert, oriented x 3, CN2-12 intact, strength normal upper extremities and lower extremities, sensation normal throughout, DTRs 2+ throughout, no cerebellar signs, gait normal  Psychiatric: normal affect, behavior normal, pleasant  Breast: nontender, no masses or lumps, no skin changes, no nipple discharge or inversion, no axillary lymphadenopathy  Gyn: Normal external genitalia without lesions, vagina with normal mucosa, cervix without lesions, no cervical motion tenderness, white abnormal vaginal discharge. Uterus and adnexa not enlarged, nontender, no masses. Swabs taken. Exam  chaperoned by nurse.  Rectal: deferred   Assessment and Plan :    Encounter Diagnoses  Name Primary?  . Routine general medical examination at a health care facility Yes  . Endometriosis   . Vaginal discharge   . Itching   . Screen for STD (sexually transmitted disease)   . Obesity with serious comorbidity, unspecified classification, unspecified obesity type   . Need for Tdap vaccination   . Influenza vaccination declined   . Encounter for surveillance of contraceptive pills   . Depressed mood   . PTSD (post-traumatic stress disorder)   . Screening for cervical cancer   . Scabies    Physical exam - discussed healthy lifestyle, diet, exercise, preventative care, vaccinations, and addressed their concerns.    Routine labs today  STD, vaginal discharge - labs today, begin metronidazole and diflucan for yeast and BV, await other testing  We discussed her desire to remain on hormonal contraception.  Discussed treatment options, types of OCPs, various other methods available.  Discussed non hormonal contraception as well.  We discussed risks of OCPs including headache, nausea, breast tenderness, irregular bleeding or spotting, possible risks of blood clots, heart attack, stroke.     Discussed advantages of OCPs including possible decreased premenstrual symptoms, improving cramps, regulating cycles, decreasing heavy flow.  Discussed proper use of medication.  She understands the benefits, risks, and wishes to c/t OCPs. Urine pregnancy negative.  Dr. Sanjuana Mae prescribes her OCP, norethindrone  Discussed safe sex, STD prevention, condom use.     Counseled on the Tdap (tetanus, diptheria, and acellular pertussis) vaccine.  Vaccine information sheet given. Tdap vaccine given after consent obtained.  Declines flu shot  Depressed mood, PTSD, c/t counseling and begin trial of Wellbutrin.  Has failed zoloft and citalopram prior.  Obesity - work on lifestyle changes through healthy diet,  exercise  Scabies - begin permethrin cream as discussed, wash clothes and bed sheets on hot cycle, avoid re-exposure, can repeat Permethrin in 1 week if needed  Follow-up pending labs.     Garna was seen today for physical.  Diagnoses and all orders for this visit:  Routine general medical examination at a health care facility -     Urinalysis Dipstick -     Comprehensive metabolic panel -     CBC -     TSH -     Hemoglobin A1c -     HIV antibody -     RPR -     GC/Chlamydia Probe Amp -     POCT urine pregnancy  Endometriosis -     naproxen (NAPROSYN) 500 MG tablet; Take 1 tablet (500 mg total) by mouth 2 (two) times daily with a meal. As needed for pain  Vaginal discharge -     HIV antibody -     RPR -  GC/Chlamydia Probe Amp -     POCT Wet Prep [facility]) -     Cytology - PAP  Itching  Screen for STD (sexually transmitted disease) -     HIV antibody -     RPR -     GC/Chlamydia Probe Amp -     Cytology - PAP  Obesity with serious comorbidity, unspecified classification, unspecified obesity type  Need for Tdap vaccination -     Tdap vaccine greater than or equal to 7yo IM  Influenza vaccination declined  Encounter for surveillance of contraceptive pills -     POCT urine pregnancy  Depressed mood  PTSD (post-traumatic stress disorder)  Screening for cervical cancer -     Cytology - PAP  Scabies  Other orders -     metroNIDAZOLE (FLAGYL) 500 MG tablet; Take 1 tablet (500 mg total) by mouth 3 (three) times daily. -     fluconazole (DIFLUCAN) 150 MG tablet; Once weekly -     permethrin (ACTICIN) 5 % cream; Apply 1 application topically once. -     Discontinue: norethindrone (AYGESTIN) 5 MG tablet; Take 0.5 tablets (2.5 mg total) by mouth daily. -     norethindrone (AYGESTIN) 5 MG tablet; Take 0.5 tablets (2.5 mg total) by mouth daily. CANCEL -     buPROPion (WELLBUTRIN XL) 150 MG 24 hr tablet; Take 1 tablet (150 mg total) by mouth daily.

## 2016-10-28 LAB — HEMOGLOBIN A1C
Hgb A1c MFr Bld: 5.3 % (ref <5.7)
Mean Plasma Glucose: 105 mg/dL

## 2016-10-28 LAB — RPR

## 2016-10-28 LAB — HIV ANTIBODY (ROUTINE TESTING W REFLEX): HIV 1&2 Ab, 4th Generation: NONREACTIVE

## 2016-11-01 LAB — CYTOLOGY - PAP
Chlamydia: NEGATIVE
Diagnosis: NEGATIVE
HPV: NOT DETECTED
Neisseria Gonorrhea: NEGATIVE

## 2016-11-17 DIAGNOSIS — N951 Menopausal and female climacteric states: Secondary | ICD-10-CM | POA: Diagnosis not present

## 2016-11-18 DIAGNOSIS — N951 Menopausal and female climacteric states: Secondary | ICD-10-CM | POA: Diagnosis not present

## 2016-11-18 DIAGNOSIS — E7800 Pure hypercholesterolemia, unspecified: Secondary | ICD-10-CM | POA: Diagnosis not present

## 2016-11-18 DIAGNOSIS — E559 Vitamin D deficiency, unspecified: Secondary | ICD-10-CM | POA: Diagnosis not present

## 2016-11-18 DIAGNOSIS — E538 Deficiency of other specified B group vitamins: Secondary | ICD-10-CM | POA: Diagnosis not present

## 2016-11-25 ENCOUNTER — Encounter: Payer: Self-pay | Admitting: Medical

## 2016-11-25 ENCOUNTER — Ambulatory Visit (INDEPENDENT_AMBULATORY_CARE_PROVIDER_SITE_OTHER): Payer: 59 | Admitting: Medical

## 2016-11-25 VITALS — BP 110/72 | HR 60 | Temp 99.1°F | Wt 243.8 lb

## 2016-11-25 DIAGNOSIS — R110 Nausea: Secondary | ICD-10-CM | POA: Diagnosis not present

## 2016-11-25 DIAGNOSIS — B9789 Other viral agents as the cause of diseases classified elsewhere: Secondary | ICD-10-CM | POA: Diagnosis not present

## 2016-11-25 DIAGNOSIS — J069 Acute upper respiratory infection, unspecified: Secondary | ICD-10-CM

## 2016-11-25 DIAGNOSIS — R6883 Chills (without fever): Secondary | ICD-10-CM | POA: Diagnosis not present

## 2016-11-25 DIAGNOSIS — R6889 Other general symptoms and signs: Secondary | ICD-10-CM

## 2016-11-25 LAB — POC INFLUENZA A&B (BINAX/QUICKVUE)
Influenza A, POC: NEGATIVE
Influenza B, POC: NEGATIVE

## 2016-11-25 NOTE — Progress Notes (Signed)
Subjective:  Frances Hill is a 31 y.o. female who presents for respiratory illness.  She notes 3 days hx/o feeling bad, chills, hot at times, sinus pressure, nausea, headache, fever up to 100.  No body aches, but fatigued.   No sore throat, no ear pain, no cough, no runny nose.  No vomiting, no diarrhea.  No other GI or GU c/o, no MSK c/o.  Using some Aleve, neti pot.  Has had some sick contacts with cold symptoms, niece and nephew.  No other aggravating or relieving factors. No other complaint.    Past Medical History:  Diagnosis Date  . Anxiety   . Cyst of right ovary   . Endometriosis   . History of ovarian cyst    12-25-2013  s/p removal left ovarian endometrioma  . History of pelvic inflammatory disease   . History of trichomoniasis   . Sickle cell trait ([place])    Current Outpatient Prescriptions on File Prior to Visit  Medication Sig Dispense Refill  . naproxen (NAPROSYN) 500 MG tablet Take 1 tablet (500 mg total) by mouth 2 (two) times daily with a meal. As needed for pain 60 tablet 0  . norethindrone (AYGESTIN) 5 MG tablet Take 0.5 tablets (2.5 mg total) by mouth daily. CANCEL 28 tablet 11  . buPROPion (WELLBUTRIN XL) 150 MG 24 hr tablet Take 1 tablet (150 mg total) by mouth daily. (Patient not taking: Reported on 11/25/2016) 30 tablet 2   No current facility-administered medications on file prior to visit.      ROS as in subjective   Objective: BP 110/72   Pulse 60   Temp 99.1 F (37.3 C)   Wt 243 lb 12.8 oz (110.6 kg)   SpO2 98%   BMI 39.95 kg/m   General appearance: Alert, WD/WN, no distress, mildly ill appearing                             Skin: warm, no rash                           Head: no sinus tenderness                            Eyes: conjunctiva normal, corneas clear, PERRLA                            Ears: flat TMs, external ear canals normal                          Nose: septum midline, turbinates swollen, with erythema and no discharge        Mouth/throat: MMM, tongue normal, mild pharyngeal erythema                           Neck: supple, no adenopathy, no thyromegaly, non tender                          Heart: RRR, normal S1, S2, no murmurs                         Lungs: CTA bilaterally, no wheezes, rales, or rhonchi      Assessment  Encounter Diagnoses  Name Primary?  . Viral URI Yes  . Nausea   . Chills   . Flu-like symptoms       Plan: Discussed symptoms, concerns, flu test negagtive, exam and symptoms suggest viral URI  Specific home care recommendations today include:  Only take over-the-counter (OTC) or prescription medicines for pain, discomfort, or fever as directed by your caregiver.    Decongestant: You may use OTC Guaifenesin (Mucinex plain) for congestion.  You may use Pseudoephedrine (Sudafed) only if you don't have blood pressure problems or a diagnosis of hypertension.  Cough suppression: If you have cough from drainage, you may use over-the-counter Dextromethorphan (Delsym) as directed on the label  Sore throat remedies:  You may use salt water gargles, warm fluids such as coffee or hot tea, or honey/tea/lemon mixture to sooth sore throat pain.  You may use OTC sore throat remedies such as Cepacol lozenges or Chloraseptic spray for sore throat pain.  Runny nose and sneezing remedies: You may use OTC antihistamine such as Zyrtec or Benadryl, but caution as these can cause drowsiness.    Pain/fever relief: You may use over-the-counter Tylenol for pain or fever  Drink extra fluids. Fluids help thin the mucus so your sinuses can drain more easily.   Applying either moist heat or ice packs to the sinus areas may help relieve discomfort.  Use saline nasal sprays to help moisten your sinuses. The sprays can be found at your local drugstore.   Patient was advised to call or return if worse or not improving in the next few days.    Patient voiced understanding of diagnosis, recommendations, and  treatment plan.

## 2016-11-28 ENCOUNTER — Telehealth: Payer: Self-pay | Admitting: Medical

## 2016-11-28 ENCOUNTER — Other Ambulatory Visit: Payer: Self-pay | Admitting: Medical

## 2016-11-28 MED ORDER — AMOXICILLIN 875 MG PO TABS: 875.0000 mg | ORAL_TABLET | Freq: Two times a day (BID) | ORAL | 0 refills | Status: DC

## 2016-11-28 NOTE — Telephone Encounter (Signed)
antibiotic sent for lingering respiratory/sinus infection

## 2016-11-28 NOTE — Telephone Encounter (Signed)
Pt called and stated she is still sick. She states she is still running a fever and still has the headache that feels like pressure. She is requesting something be sent in for her. Pt uses [facility] and can be reached at [phone].

## 2016-11-28 NOTE — Telephone Encounter (Signed)
Pt informed

## 2016-12-23 DIAGNOSIS — E559 Vitamin D deficiency, unspecified: Secondary | ICD-10-CM | POA: Diagnosis not present

## 2016-12-23 DIAGNOSIS — E7800 Pure hypercholesterolemia, unspecified: Secondary | ICD-10-CM | POA: Diagnosis not present

## 2016-12-23 DIAGNOSIS — E538 Deficiency of other specified B group vitamins: Secondary | ICD-10-CM | POA: Diagnosis not present

## 2016-12-28 DIAGNOSIS — N809 Endometriosis, unspecified: Secondary | ICD-10-CM | POA: Diagnosis not present

## 2017-01-02 DIAGNOSIS — E7800 Pure hypercholesterolemia, unspecified: Secondary | ICD-10-CM | POA: Diagnosis not present

## 2017-01-02 DIAGNOSIS — E559 Vitamin D deficiency, unspecified: Secondary | ICD-10-CM | POA: Diagnosis not present

## 2017-01-02 DIAGNOSIS — E538 Deficiency of other specified B group vitamins: Secondary | ICD-10-CM | POA: Diagnosis not present

## 2017-01-03 DIAGNOSIS — N809 Endometriosis, unspecified: Secondary | ICD-10-CM | POA: Diagnosis not present

## 2017-01-03 DIAGNOSIS — R102 Pelvic and perineal pain: Secondary | ICD-10-CM | POA: Diagnosis not present

## 2017-01-03 DIAGNOSIS — M545 Low back pain: Secondary | ICD-10-CM | POA: Diagnosis not present

## 2017-01-04 ENCOUNTER — Encounter: Payer: Self-pay | Admitting: Family Medicine

## 2017-01-04 ENCOUNTER — Ambulatory Visit (INDEPENDENT_AMBULATORY_CARE_PROVIDER_SITE_OTHER): Payer: 59 | Admitting: Family Medicine

## 2017-01-04 VITALS — BP 130/80 | HR 84 | Temp 98.8°F | Ht 65.5 in | Wt 242.2 lb

## 2017-01-04 DIAGNOSIS — R112 Nausea with vomiting, unspecified: Secondary | ICD-10-CM

## 2017-01-04 DIAGNOSIS — R197 Diarrhea, unspecified: Secondary | ICD-10-CM

## 2017-01-04 DIAGNOSIS — R109 Unspecified abdominal pain: Secondary | ICD-10-CM | POA: Diagnosis not present

## 2017-01-04 DIAGNOSIS — K529 Noninfective gastroenteritis and colitis, unspecified: Secondary | ICD-10-CM | POA: Diagnosis not present

## 2017-01-04 DIAGNOSIS — A09 Infectious gastroenteritis and colitis, unspecified: Secondary | ICD-10-CM

## 2017-01-04 LAB — POCT URINE PREGNANCY: Preg Test, Ur: NEGATIVE

## 2017-01-04 LAB — POCT URINALYSIS DIPSTICK
Bilirubin, UA: NEGATIVE
Blood, UA: NEGATIVE
Glucose, UA: NEGATIVE
Ketones, UA: NEGATIVE
Leukocytes, UA: NEGATIVE
Nitrite, UA: NEGATIVE
Spec Grav, UA: 1.025 (ref 1.030–1.035)
Urobilinogen, UA: NEGATIVE (ref ?–2.0)
pH, UA: 5.5 (ref 5.0–8.0)

## 2017-01-04 MED ORDER — ONDANSETRON 4 MG PO TBDP: 4.0000 mg | ORAL_TABLET | Freq: Three times a day (TID) | ORAL | 0 refills | Status: DC | PRN

## 2017-01-04 NOTE — Patient Instructions (Addendum)
Be sure to drink plenty of fluids. Stay away from dairy for at least 5 days. You may use Imodium as needed for diarrhea. You can use the prescription medication to help with nausea and vomiting.   Take the naproxen only if you are able to eat food; use tylenol for pain/fever if unable to eat much.  Return or go to ER if you are unable to keep fluids down, getting dehydrated (feeling dizzy/faint), worsening abdominal/pelvic pain, discharge, blood in the stool (or tarry looking stools) or blood in emesis, or if the emesis looks like coffee grounds.   Viral Gastroenteritis, Adult Viral gastroenteritis is also known as the stomach flu. This condition is caused by various viruses. These viruses can be passed from person to person very easily (are very contagious). This condition may affect your stomach, small intestine, and large intestine. It can cause sudden watery diarrhea, fever, and vomiting. Diarrhea and vomiting can make you feel weak and cause you to become dehydrated. You may not be able to keep fluids down. Dehydration can make you tired and thirsty, cause you to have a dry mouth, and decrease how often you urinate. Older adults and people with other diseases or a weak immune system are at higher risk for dehydration. It is important to replace the fluids that you lose from diarrhea and vomiting. If you become severely dehydrated, you may need to get fluids through an IV tube. What are the causes? Gastroenteritis is caused by various viruses, including rotavirus and norovirus. Norovirus is the most common cause in adults. You can get sick by eating food, drinking water, or touching a surface contaminated with one of these viruses. You can also get sick from sharing utensils or other personal items with an infected person. What increases the risk? This condition is more likely to develop in people:  Who have a weak defense system (immune system).  Who live with one or more children who are  younger than 58 years old.  Who live in a nursing home.  Who go on cruise ships. What are the signs or symptoms? Symptoms of this condition start suddenly 1-2 days after exposure to a virus. Symptoms may last a few days or as long as a week. The most common symptoms are watery diarrhea and vomiting. Other symptoms include:  Fever.  Headache.  Fatigue.  Pain in the abdomen.  Chills.  Weakness.  Nausea.  Muscle aches.  Loss of appetite. How is this diagnosed? This condition is diagnosed with a medical history and physical exam. You may also have a stool test to check for viruses or other infections. How is this treated? This condition typically goes away on its own. The focus of treatment is to restore lost fluids (rehydration). Your health care provider may recommend that you take an oral rehydration solution (ORS) to replace important salts and minerals (electrolytes) in your body. Severe cases of this condition may require giving fluids through an IV tube. Treatment may also include medicine to help with your symptoms. Follow these instructions at home: Follow instructions from your health care provider about how to care for yourself at home. Eating and drinking  Follow these recommendations as told by your health care provider:  Take an ORS. This is a drink that is sold at pharmacies and retail stores.  Drink clear fluids in small amounts as you are able. Clear fluids include water, ice chips, diluted fruit juice, and low-calorie sports drinks.  Eat bland, easy-to-digest foods in small amounts  as you are able. These foods include bananas, applesauce, rice, lean meats, toast, and crackers.  Avoid fluids that contain a lot of sugar or caffeine, such as energy drinks, sports drinks, and soda.  Avoid alcohol.  Avoid spicy or fatty foods. General instructions    Drink enough fluid to keep your urine clear or pale yellow.  Wash your hands often. If soap and water are not  available, use hand sanitizer.  Make sure that all people in your household wash their hands well and often.  Take over-the-counter and prescription medicines only as told by your health care provider.  Rest at home while you recover.  Watch your condition for any changes.  Take a warm bath to relieve any burning or pain from frequent diarrhea episodes.  Keep all follow-up visits as told by your health care provider. This is important. Contact a health care provider if:  You cannot keep fluids down.  Your symptoms get worse.  You have new symptoms.  You feel light-headed or dizzy.  You have muscle cramps. Get help right away if:  You have chest pain.  You feel extremely weak or you faint.  You see blood in your vomit.  Your vomit looks like coffee grounds.  You have bloody or black stools or stools that look like tar.  You have a severe headache, a stiff neck, or both.  You have a rash.  You have severe pain, cramping, or bloating in your abdomen.  You have trouble breathing or you are breathing very quickly.  Your heart is beating very quickly.  Your skin feels cold and clammy.  You feel confused.  You have pain when you urinate.  You have signs of dehydration, such as:  Dark urine, very little urine, or no urine.  Cracked lips.  Dry mouth.  Sunken eyes.  Sleepiness.  Weakness. This information is not intended to replace advice given to you by your health care provider. Make sure you discuss any questions you have with your health care provider. Document Released: 09/19/2005 Document Revised: 03/02/2016 Document Reviewed: 05/26/2015 Elsevier Interactive Patient Education  2017 [employer].

## 2017-01-04 NOTE — Progress Notes (Signed)
Chief Complaint  Patient presents with  . Emesis    and diarrhea x 2 days. Has not vomited today but has had diarrhea. Sweats and HA. After she eats she gets nauseous. Just had a smoothie today.    She is complaining of lower abdominal pain, pelvic pain, vomiting and diarrhea. Felt warm and chillsed this morning, didn't check temperature.  No blood in emesis or stool.  2 days ago she started with nausea, yesterday she started vomiting, for a total of twice yesterday. Nausea persists.  Diarrhea started later yesterday, and continues today. She is having bloating and cramping in her lower abdomen, and pelvic pain--started about 3 days ago.  She took 3 naproxen yesterday without any improvement in abd/pelvic pain.  Denies vaginal discharge, dysuria.   h/o endometriosis, menses are usually painful, but this is worse than usual.  She was around her nephew this weekend who was having diarrhea.  Had some dairy yesterday. No recent antibiotics, travel, spoiled or undercooked meats.  +unprotected intercourse since last period.  She has been off her OCP's x 2-3 months.  Monogamous relationship, denies risk for STD.   PMH, PSH, SH reviewed. Meds--naproxen 500mg  x 3 doses yesterday, none today.  No Known Allergies  ROS:  Subjective fevers; Denies headache, dizziness, bleeding, bruising, rash. No chest pain, shortness of breath, bleeding, bruising, rash. +N/V/D/abdominal pain.  See HPI.  Hasn't taken any naproxen yet today.  Felt hot/cold this morning when she woke up.   PHYSICAL EXAM:  BP 130/80 (BP Location: Left Arm, Patient Position: Sitting, Cuff Size: Normal)   Pulse 84   Temp 98.8 F (37.1 C) (Tympanic)   Ht 5' 5.5" (1.664 m)   Wt 242 lb 3.2 oz (109.9 kg)   LMP 12/07/2016 (Approximate)   BMI 39.69 kg/m   Pleasant female, shaking her leg a lot, seeming uncomfortable--she reports related to nausea. HEENT: PERRL, EOMI, conjunctiva and sclera are clear. OP clear, moist mucus  membranes Neck: no lymphadenopathy or mass Heart: regular rate and rhythm Lungs: clear bilaterally Back: no CVA tenderness Abdomen: active bowel sounds.  Mild diffuse tenderness along lower abdomen, mostly central (suprapubic) and slightly to the left. No mass, rebound or guarding Extremities: no edema Skin: normal turgor, no rash  ASSESSMENT/PLAN:  Acute gastroenteritis  Abdominal pain, unspecified abdominal location - Plan: POCT Urinalysis Dipstick, POCT urine pregnancy  Nausea and vomiting, intractability of vomiting not specified, unspecified vomiting type - Plan: ondansetron (ZOFRAN ODT) 4 MG disintegrating tablet  Diarrhea of presumed infectious origin   Risks/side effects of meds reviewed.   Be sure to drink plenty of fluids. Stay away from dairy for at least 5 days. You may use Imodium as needed for diarrhea. You can use the prescription medication to help with nausea and vomiting.   Take the naproxen only if you are able to eat food; use tylenol for pain/fever if unable to eat much.  Return or go to ER if you are unable to keep fluids down, getting dehydrated (feeling dizzy/faint), worsening abdominal/pelvic pain, discharge, blood in the stool (or tarry looking stools) or blood in emesis, or if the emesis looks like coffee grounds.

## 2017-01-12 DIAGNOSIS — M545 Low back pain: Secondary | ICD-10-CM | POA: Diagnosis not present

## 2017-01-12 DIAGNOSIS — R102 Pelvic and perineal pain: Secondary | ICD-10-CM | POA: Diagnosis not present

## 2017-01-12 DIAGNOSIS — N809 Endometriosis, unspecified: Secondary | ICD-10-CM | POA: Diagnosis not present

## 2017-02-14 DIAGNOSIS — M545 Low back pain: Secondary | ICD-10-CM | POA: Diagnosis not present

## 2017-02-14 DIAGNOSIS — N809 Endometriosis, unspecified: Secondary | ICD-10-CM | POA: Diagnosis not present

## 2017-02-14 DIAGNOSIS — R102 Pelvic and perineal pain: Secondary | ICD-10-CM | POA: Diagnosis not present

## 2017-02-21 DIAGNOSIS — N809 Endometriosis, unspecified: Secondary | ICD-10-CM | POA: Diagnosis not present

## 2017-02-21 DIAGNOSIS — R102 Pelvic and perineal pain: Secondary | ICD-10-CM | POA: Diagnosis not present

## 2017-02-21 DIAGNOSIS — M545 Low back pain: Secondary | ICD-10-CM | POA: Diagnosis not present

## 2017-02-28 DIAGNOSIS — R102 Pelvic and perineal pain: Secondary | ICD-10-CM | POA: Diagnosis not present

## 2017-02-28 DIAGNOSIS — M545 Low back pain: Secondary | ICD-10-CM | POA: Diagnosis not present

## 2017-02-28 DIAGNOSIS — N809 Endometriosis, unspecified: Secondary | ICD-10-CM | POA: Diagnosis not present

## 2017-04-02 DIAGNOSIS — J302 Other seasonal allergic rhinitis: Secondary | ICD-10-CM | POA: Diagnosis not present

## 2017-04-02 DIAGNOSIS — J019 Acute sinusitis, unspecified: Secondary | ICD-10-CM | POA: Diagnosis not present

## 2017-04-12 ENCOUNTER — Ambulatory Visit: Payer: 59 | Admitting: Medical

## 2017-04-12 DIAGNOSIS — J019 Acute sinusitis, unspecified: Secondary | ICD-10-CM | POA: Diagnosis not present

## 2017-04-14 ENCOUNTER — Encounter: Payer: Self-pay | Admitting: Medical

## 2017-05-09 ENCOUNTER — Encounter: Payer: Self-pay | Admitting: Medical

## 2017-05-09 ENCOUNTER — Ambulatory Visit (INDEPENDENT_AMBULATORY_CARE_PROVIDER_SITE_OTHER): Payer: 59 | Admitting: Medical

## 2017-05-09 VITALS — BP 118/68 | HR 76 | Wt 250.4 lb

## 2017-05-09 DIAGNOSIS — N898 Other specified noninflammatory disorders of vagina: Secondary | ICD-10-CM

## 2017-05-09 DIAGNOSIS — R102 Pelvic and perineal pain: Secondary | ICD-10-CM | POA: Diagnosis not present

## 2017-05-09 DIAGNOSIS — Z90721 Acquired absence of ovaries, unilateral: Secondary | ICD-10-CM

## 2017-05-09 DIAGNOSIS — D259 Leiomyoma of uterus, unspecified: Secondary | ICD-10-CM

## 2017-05-09 DIAGNOSIS — R350 Frequency of micturition: Secondary | ICD-10-CM | POA: Diagnosis not present

## 2017-05-09 LAB — POCT WET PREP (WET MOUNT): Clue Cells Wet Prep Whiff POC: POSITIVE

## 2017-05-09 LAB — POCT URINE PREGNANCY: Preg Test, Ur: NEGATIVE

## 2017-05-09 MED ORDER — FLUCONAZOLE 150 MG PO TABS: ORAL_TABLET | ORAL | 0 refills | Status: DC

## 2017-05-09 MED ORDER — OXYBUTYNIN CHLORIDE 5 MG PO TABS: 5.0000 mg | ORAL_TABLET | Freq: Two times a day (BID) | ORAL | 0 refills | Status: DC

## 2017-05-09 NOTE — Addendum Note (Signed)
Addended by: Tyrone Apple on: 05/09/2017 03:13 PM   Modules accepted: Orders

## 2017-05-09 NOTE — Progress Notes (Signed)
Subjective: Chief Complaint  Patient presents with  . having pain in side    having side pain , having discharge x 35month    Here for pelvic pain.  having some sharp pains in right lower abdomen, having pains throbbing pains in vagina x last few months.   Not constant, is intermittent.  Has pain with intercourse, not just introitus.    Periods have been heavy of late.  Last period had to constantly change the pads.    Periods are regular but heavy.  Same partner for a while.  On OCP, but not good about taking all the time.     Having some vaginal itching, but has hx/o chronic yeast infections.   Has a little discharge, whitish.    Needs referral back to fertility doctor, Dr. Abigail Butts for f/u.  Never been pregnant  Also notes increased urination particularly at night, but no blood in urine, no burning.  Does get slight urge incontinence at times.    No prior treatment for OAB  She had gynecological surgery 2015 and 2016.  Past Medical History:  Diagnosis Date  . Anxiety   . Cyst of right ovary   . Endometriosis   . History of ovarian cyst    12-25-2013  s/p removal left ovarian endometrioma  . History of pelvic inflammatory disease   . History of trichomoniasis   . Sickle cell trait ([place])    Current Outpatient Prescriptions on File Prior to Visit  Medication Sig Dispense Refill  . naproxen (NAPROSYN) 500 MG tablet Take 1 tablet (500 mg total) by mouth 2 (two) times daily with a meal. As needed for pain 60 tablet 0  . norethindrone (AYGESTIN) 5 MG tablet Take 0.5 tablets (2.5 mg total) by mouth daily. CANCEL 28 tablet 11  . ondansetron (ZOFRAN ODT) 4 MG disintegrating tablet Take 1 tablet (4 mg total) by mouth every 8 (eight) hours as needed for nausea or vomiting. (Patient not taking: Reported on 05/09/2017) 15 tablet 0   No current facility-administered medications on file prior to visit.     Past Surgical History:  Procedure Laterality Date  . CHROMOPERTUBATION  04/24/2015    Procedure: CHROMOPERTUBATION;  Surgeon: Governor Specking, MD;  Location: [facility];  Service: Gynecology;;  . LAPAROSCOPIC LYSIS OF ADHESIONS  04/24/2015   Procedure: LAPAROSCOPIC LYSIS OF ADHESIONS AND EXCISION ENDOMETRIOTIC IMPLANTS;  Surgeon: Governor Specking, MD;  Location: [facility];  Service: Gynecology;;  . LAPAROSCOPIC OVARIAN CYSTECTOMY Right 04/24/2015   Procedure: LAPAROSCOPIC, RIGHT OVARIAN CYSTECTOMY,  GEL PORT ASSISTED MYOMECTOMY;  Surgeon: Governor Specking, MD;  Location: [facility];  Service: Gynecology;  Laterality: Right;  . LAPAROSCOPY N/A 12/25/2013   Procedure: OPERATIVE LAPAROSCOPY;  Surgeon: Osborne Oman, MD;  Location: [facility];  Service: Gynecology;  Laterality: N/A;  . SALPINGOOPHORECTOMY Left 12/25/2013   Procedure: SALPINGO OOPHORECTOMY;  Surgeon: Osborne Oman, MD;  Location: [facility];  Service: Gynecology;  Laterality: Left;   ROS as in subjective   Objective: BP 118/68   Pulse 76   Wt 250 lb 6.4 oz (113.6 kg)   SpO2 98%   BMI 41.04 kg/m   Gen: wd, wn, nad Abdomen: nontender, no mass, no organomegaly Gyn: Normal external genitalia without lesions, vagina with normal mucosa, cervix without lesions, no cervical motion tenderness, no abnormal vaginal discharge.  Uterus and adnexa not enlarged, nontender, no masses.  Swabs taken.  Exam chaperoned by nurse.   Assessment: Encounter Diagnoses  Name  Primary?  . Pelvic pain Yes  . Vaginal discharge   . Uterine leiomyoma, unspecified location   . History of left oophorectomy   . Urinary frequency     Plan: Begin diflucan.  Will call with other results.  referral back to gyn/fertility specialist Chronic pelvic pian - Discussed possible causes of pelvic pain, consider pelvic US if labs normal. Begin trial of oxybutynin for OAB symptoms.  Azeneth was seen today for having pain in side.  Diagnoses and all orders for this visit:  Pelvic pain -     POCT  Wet Prep (Wet [place]) -     GC/Chlamydia Probe Amp -     POCT urine pregnancy  Vaginal discharge -     POCT Wet Prep (Wet [place]) -     GC/Chlamydia Probe Amp  Uterine leiomyoma, unspecified location -     POCT Wet Prep ([place]et [place]) -     GC/Chlamydia Probe Amp  History of left oophorectomy -     POCT Wet Prep ([place]) -     GC/Chlamydia Probe Amp  Urinary frequency  Other orders -     fluconazole (DIFLUCAN) 150 MG tablet; 1 tablet now, may repeat in 1 week -     oxybutynin (DITROPAN) 5 MG tablet; Take 1 tablet (5 mg total) by mouth 2 (two) times daily.

## 2017-05-10 LAB — GC/CHLAMYDIA PROBE AMP
CT Probe RNA: NOT DETECTED
GC Probe RNA: NOT DETECTED

## 2017-05-11 DIAGNOSIS — N760 Acute vaginitis: Secondary | ICD-10-CM | POA: Diagnosis not present

## 2017-05-11 LAB — URINE CULTURE

## 2017-05-22 ENCOUNTER — Emergency Department (HOSPITAL_COMMUNITY)
Admission: EM | Admit: 2017-05-22 | Discharge: 2017-05-23 | Disposition: A | Discharge status: Home / Self Care | Payer: 59 | Attending: Emergency Medicine | Admitting: Emergency Medicine

## 2017-05-22 DIAGNOSIS — R1033 Periumbilical pain: Secondary | ICD-10-CM | POA: Diagnosis not present

## 2017-05-22 DIAGNOSIS — R1031 Right lower quadrant pain: Secondary | ICD-10-CM | POA: Diagnosis not present

## 2017-05-22 DIAGNOSIS — Z79899 Other long term (current) drug therapy: Secondary | ICD-10-CM | POA: Diagnosis not present

## 2017-05-22 DIAGNOSIS — N801 Endometriosis of ovary: Secondary | ICD-10-CM | POA: Diagnosis not present

## 2017-05-22 DIAGNOSIS — N80129 Deep endometriosis of ovary, unspecified ovary: Secondary | ICD-10-CM

## 2017-05-22 DIAGNOSIS — D573 Sickle-cell trait: Secondary | ICD-10-CM | POA: Diagnosis not present

## 2017-05-22 DIAGNOSIS — R1030 Lower abdominal pain, unspecified: Secondary | ICD-10-CM

## 2017-05-22 LAB — COMPREHENSIVE METABOLIC PANEL
ALT: 14 U/L (ref 14–54)
AST: 18 U/L (ref 15–41)
Albumin: 3.1 g/dL — ABNORMAL LOW (ref 3.5–5.0)
Alkaline Phosphatase: 41 U/L (ref 38–126)
Anion gap: 5 (ref 5–15)
BUN: 12 mg/dL (ref 6–20)
CO2: 26 mmol/L (ref 22–32)
Calcium: 8.6 mg/dL — ABNORMAL LOW (ref 8.9–10.3)
Chloride: 110 mmol/L (ref 101–111)
Creatinine, Ser: 0.89 mg/dL (ref 0.44–1.00)
GFR calc Af Amer: >60 mL/min (ref >60)
GFR calc non Af Amer: >60 mL/min (ref >60)
Glucose, Bld: 93 mg/dL (ref 65–99)
Potassium: 3.8 mmol/L (ref 3.5–5.1)
Sodium: 141 mmol/L (ref 135–145)
Total Bilirubin: 0.4 mg/dL (ref 0.3–1.2)
Total Protein: 5.6 g/dL — ABNORMAL LOW (ref 6.5–8.1)

## 2017-05-22 LAB — URINALYSIS, ROUTINE W REFLEX MICROSCOPIC
Bilirubin Urine: NEGATIVE
Glucose, UA: NEGATIVE mg/dL
Ketones, ur: NEGATIVE mg/dL
Nitrite: NEGATIVE
Protein, ur: NEGATIVE mg/dL
Specific Gravity, Urine: 1.014 (ref 1.005–1.030)
pH: 5 (ref 5.0–8.0)

## 2017-05-22 LAB — WET PREP, GENITAL
Clue Cells Wet Prep HPF POC: NONE SEEN
Sperm: NONE SEEN
Trich, Wet Prep: NONE SEEN
Yeast Wet Prep HPF POC: NONE SEEN

## 2017-05-22 LAB — CBC
HCT: 36.8 % (ref 36.0–46.0)
Hemoglobin: 12.2 g/dL (ref 12.0–15.0)
MCH: 25.1 pg — ABNORMAL LOW (ref 26.0–34.0)
MCHC: 33.2 g/dL (ref 30.0–36.0)
MCV: 75.6 fL — ABNORMAL LOW (ref 78.0–100.0)
Platelets: 322 10*3/uL (ref 150–400)
RBC: 4.87 MIL/uL (ref 3.87–5.11)
RDW: 14 % (ref 11.5–15.5)
WBC: 7.7 10*3/uL (ref 4.0–10.5)

## 2017-05-22 LAB — LIPASE, BLOOD: Lipase: 21 U/L (ref 11–51)

## 2017-05-22 LAB — I-STAT BETA HCG BLOOD, ED (MC, WL, AP ONLY): I-stat hCG, quantitative: <5 m[IU]/mL (ref <5)

## 2017-05-22 MED ORDER — OXYCODONE-ACETAMINOPHEN 5-325 MG PO TABS
2.0000 | ORAL_TABLET | Freq: Once | ORAL | Status: AC
  Administered 2017-05-22: 2 via ORAL
  Filled 2017-05-22: qty 2

## 2017-05-22 NOTE — ED Provider Notes (Signed)
[place] DEPT Provider Note   CSN: [id number] Arrival date & time: 05/22/17  1726     History   Chief Complaint Chief Complaint  Patient presents with  . Abdominal Pain    HPI Frances Hill is a 31 y.o. female with a hx of anxiety, Endometriosis, ovarian cyst requiring removal of left ovary, PID presents to the Emergency Department complaining of gradual, persistent, progressively worsening right lower quadrant abdominal and pelvic pain onset intermittent over the last several months but significantly worse this morning and persistent. Patient reports the pain is a 8/10 and described as sharp/stabbing.  Patient reports she has been menstruating for the last 3 days and her abdominal cramping is always worse with menstruation however this pain is different. Patient reports she is followed by Dr. Harolyn Rutherford with the women[facility] clinic for her endometriosis. Patient denies fevers or chills, vomiting, diarrhea, weakness, dizziness, syncope. She reports some associated nausea. She reports she sexually active with one female partner. She has a history of STD but has not had one in more than a year.  She has taken tramadol without relief.   The history is provided by the patient and medical records. No language interpreter was used.    Past Medical History:  Diagnosis Date  . Anxiety   . Cyst of right ovary   . Endometriosis   . History of ovarian cyst    12-25-2013  s/p removal left ovarian endometrioma  . History of pelvic inflammatory disease   . History of trichomoniasis   . Sickle cell trait [facility])     Patient Active Problem List   Diagnosis Date Noted  . Routine general medical examination at a health care facility 10/27/2016  . Vaginal discharge 10/27/2016  . Itching 10/27/2016  . Screen for STD (sexually transmitted disease) 10/27/2016  . Obesity with serious comorbidity 10/27/2016  . Influenza vaccination declined 10/27/2016  . Encounter for surveillance of contraceptive  pills 10/27/2016  . Depressed mood 10/27/2016  . PTSD (post-traumatic stress disorder) 10/27/2016  . Screening for cervical cancer 10/27/2016  . Bladder infection 09/30/2015  . Frequent headaches 09/30/2015  . Generalized anxiety disorder 09/30/2015  . Insomnia 09/30/2015  . Chronic pelvic pain in female 09/30/2015  . Female fertility problem 09/30/2015  . Endometriosis of ovary 04/24/2015  . Dyspareunia 04/22/2015  . Venereal disease contact 04/22/2015  . Missed period 04/22/2015  . Cyst of right ovary 04/22/2015  . Endometriosis 01/14/2014  . Ovarian cyst 12/11/2013    Past Surgical History:  Procedure Laterality Date  . CHROMOPERTUBATION  04/24/2015   Procedure: CHROMOPERTUBATION;  Surgeon: Governor Specking, MD;  Location: [facility];  Service: Gynecology;;  . LAPAROSCOPIC LYSIS OF ADHESIONS  04/24/2015   Procedure: LAPAROSCOPIC LYSIS OF ADHESIONS AND EXCISION ENDOMETRIOTIC IMPLANTS;  Surgeon: Governor Specking, MD;  Location: [place];  Service: Gynecology;;  . LAPAROSCOPIC OVARIAN CYSTECTOMY Right 04/24/2015   Procedure: LAPAROSCOPIC, RIGHT OVARIAN CYSTECTOMY,  GEL PORT ASSISTED MYOMECTOMY;  Surgeon: Governor Specking, MD;  Location: [place];  Service: Gynecology;  Laterality: Right;  . LAPAROSCOPY N/A 12/25/2013   Procedure: OPERATIVE LAPAROSCOPY;  Surgeon: Osborne Oman, MD;  Location: [facility];  Service: Gynecology;  Laterality: N/A;  . SALPINGOOPHORECTOMY Left 12/25/2013   Procedure: SALPINGO OOPHORECTOMY;  Surgeon: Osborne Oman, MD;  Location: [facility];  Service: Gynecology;  Laterality: Left;    OB History    Gravida Para Term Preterm AB Living   0 0 0 0 0 0  SAB TAB Ectopic Multiple Live Births   0 0 0 0         Home Medications    Prior to Admission medications   Medication Sig Start Date End Date Taking? Authorizing Provider  fluconazole (DIFLUCAN) 150 MG tablet 1 tablet now, may repeat in 1 week 05/09/17  Yes  Tysinger, Camelia Eng, PA-C  fluticasone (FLONASE) 50 MCG/ACT nasal spray Place 1 spray into both nostrils daily. 04/02/17  Yes [provider]  naproxen (NAPROSYN) 500 MG tablet Take 1 tablet (500 mg total) by mouth 2 (two) times daily with a meal. As needed for pain 10/27/16  Yes Tysinger, Camelia Eng, PA-C  norethindrone (AYGESTIN) 5 MG tablet Take 0.5 tablets (2.5 mg total) by mouth daily. CANCEL 10/27/16  Yes Tysinger, Camelia Eng, PA-C  traMADol (ULTRAM) 50 MG tablet Take 50 mg by mouth 4 (four) times daily as needed for pain. 03/04/17  Yes [provider]  HYDROcodone-acetaminophen (NORCO/VICODIN) 5-325 MG tablet Take 1-2 tablets by mouth every 4 (four) hours as needed. 05/23/17   Wasyl Dornfeld, Jarrett Soho, PA-C  ondansetron (ZOFRAN ODT) 8 MG disintegrating tablet 8mg  ODT q4 hours prn nausea 05/23/17   Manasa Spease, Jarrett Soho, PA-C  oxybutynin (DITROPAN) 5 MG tablet Take 1 tablet (5 mg total) by mouth 2 (two) times daily. 05/09/17   Tysinger, Camelia Eng, PA-C    Family History Family History  Problem Relation Age of Onset  . Aneurysm Mother 67       died of aneurysm, brain  . Cancer Mother 27       breast, twice  . Hypertension Father   . Depression Father   . Heart disease Father        valve disease  . Other Father        DDD  . Other Brother        DDD  . Sarcoidosis Brother   . Cancer Maternal Aunt        breast cancer  . Diabetes Neg Hx   . Stroke Neg Hx     Social History Social History  Substance Use Topics  . Smoking status: Never Smoker  . Smokeless tobacco: Never Used  . Alcohol use Yes     Comment: occasionally     Allergies   Patient has no known allergies.   Review of Systems Review of Systems  Constitutional: Negative for appetite change, diaphoresis, fatigue, fever and unexpected weight change.  HENT: Negative for mouth sores.   Eyes: Negative for visual disturbance.  Respiratory: Negative for cough, chest tightness, shortness of breath and wheezing.     Cardiovascular: Negative for chest pain.  Gastrointestinal: Positive for abdominal pain and nausea. Negative for constipation, diarrhea and vomiting.  Endocrine: Negative for polydipsia, polyphagia and polyuria.  Genitourinary: Negative for dysuria, frequency, hematuria and urgency.  Musculoskeletal: Negative for back pain and neck stiffness.  Skin: Negative for rash.  Allergic/Immunologic: Negative for immunocompromised state.  Neurological: Negative for syncope, light-headedness and headaches.  Hematological: Does not bruise/bleed easily.  Psychiatric/Behavioral: Negative for sleep disturbance. The patient is not nervous/anxious.   All other systems reviewed and are negative.    Physical Exam Updated Vital Signs BP 118/64 (BP Location: Right Arm)   Pulse 82   Resp 14   LMP 05/17/2017   SpO2 100%   Physical Exam  Constitutional: She appears well-developed and well-nourished. No distress.  Awake, alert, nontoxic appearance  HENT:  Head: Normocephalic and atraumatic.  Mouth/Throat: Oropharynx is clear and moist. No oropharyngeal  exudate.  Eyes: Conjunctivae are normal. No scleral icterus.  Neck: Normal range of motion. Neck supple.  Cardiovascular: Normal rate, regular rhythm, normal heart sounds and intact distal pulses.   No murmur heard. Pulmonary/Chest: Effort normal and breath sounds normal. No respiratory distress. She has no wheezes.  Equal chest expansion  Abdominal: Soft. Bowel sounds are normal. She exhibits no mass. There is tenderness in the right lower quadrant and suprapubic area. There is no rigidity, no rebound, no guarding, no CVA tenderness, no tenderness at McBurney's point and negative Murphy's sign. Hernia confirmed negative in the right inguinal area and confirmed negative in the left inguinal area.  Genitourinary: Uterus normal. No labial fusion. There is no rash, tenderness or lesion on the right labia. There is no rash, tenderness or lesion on the left  labia. Uterus is not deviated, not enlarged, not fixed and not tender. Cervix exhibits no motion tenderness, no discharge and no friability. Right adnexum displays tenderness. Right adnexum displays no mass and no fullness. Left adnexum displays no mass, no tenderness and no fullness. No erythema, tenderness or bleeding in the vagina. No foreign body in the vagina. No signs of injury Owens Shark, then) around the vagina. Vaginal discharge (brown, thin) found.  Genitourinary Comments: Chaperone present  Musculoskeletal: Normal range of motion. She exhibits no edema.  Lymphadenopathy:       Right: No inguinal adenopathy present.       Left: No inguinal adenopathy present.  Neurological: She is alert.  Speech is clear and goal oriented Moves extremities without ataxia  Skin: Skin is warm and dry. She is not diaphoretic. No erythema.  Psychiatric: She has a normal mood and affect.  Nursing note and vitals reviewed.    ED Treatments / Results  Labs (all labs ordered are listed, but only abnormal results are displayed) Labs Reviewed  WET PREP, GENITAL - Abnormal; Notable for the following:       Result Value   WBC, Wet Prep HPF POC FEW (*)    All other components within normal limits  COMPREHENSIVE METABOLIC PANEL - Abnormal; Notable for the following:    Calcium 8.6 (*)    Total Protein 5.6 (*)    Albumin 3.1 (*)    All other components within normal limits  CBC - Abnormal; Notable for the following:    MCV 75.6 (*)    MCH 25.1 (*)    All other components within normal limits  URINALYSIS, ROUTINE W REFLEX MICROSCOPIC - Abnormal; Notable for the following:    APPearance HAZY (*)    Hgb urine dipstick MODERATE (*)    Leukocytes, UA TRACE (*)    Bacteria, UA RARE (*)    Squamous Epithelial / LPF 6-30 (*)    All other components within normal limits  LIPASE, BLOOD  I-STAT BETA HCG BLOOD, ED ([facility], [facility], [facility] ONLY)  GC/CHLAMYDIA PROBE AMP () NOT AT [facility]    Radiology US Transvaginal  Non-ob  Result Date: 05/23/2017 CLINICAL DATA:  Right-sided pelvic pain.  History of endometriosis. EXAM: TRANSABDOMINAL AND TRANSVAGINAL ULTRASOUND OF PELVIS DOPPLER ULTRASOUND OF OVARIES TECHNIQUE: Both transabdominal and transvaginal ultrasound examinations of the pelvis were performed. Transabdominal technique was performed for global imaging of the pelvis including uterus, ovaries, adnexal regions, and pelvic cul-de-sac. It was necessary to proceed with endovaginal exam following the transabdominal exam to visualize the right ovary. Color and duplex Doppler ultrasound was utilized to evaluate blood flow to the ovaries. COMPARISON:  06/10/2015 CT, pelvic ultrasound  01/30/2015 FINDINGS: Uterus Measurements: 8.3 x 3.7 x 5.4 cm. Pedunculated posterior left-sided fibroid measuring 4.9 x 4.9 x 5.2 cm. Endometrium Thickness: 11.7 mm.  No focal abnormality visualized. Right ovary Measurements: 5.2 x 3.6 x 4 cm. Homogeneously hypoechoic complex cysts of the right ovary consistent with an endometrioma measuring 2.5 x 2.5 x 2.6 cm. Left ovary Surgically absent. Pulsed Doppler evaluation of the right ovary demonstrates normal low-resistance arterial and venous waveforms. Other findings No abnormal free fluid. IMPRESSION: 1. Complex right ovarian 2.5 x 2.5 x 2.6 cm homogeneous, hypoechoic cyst consistent with an endometrioma. No torsion. 2. Pedunculated left-sided posterior uterine fibroid measuring 4.9 x 4.9 x 5.2 cm without interval enlargement since prior CT (previously estimated at 7.5 x 4.5 cm 2 orthogonal planes). Electronically Signed   By: Ashley Royalty M.D.   On: 05/23/2017 00:54   US Pelvis Complete  Result Date: 05/23/2017 CLINICAL DATA:  Right-sided pelvic pain.  History of endometriosis. EXAM: TRANSABDOMINAL AND TRANSVAGINAL ULTRASOUND OF PELVIS DOPPLER ULTRASOUND OF OVARIES TECHNIQUE: Both transabdominal and transvaginal ultrasound examinations of the pelvis were performed. Transabdominal technique was  performed for global imaging of the pelvis including uterus, ovaries, adnexal regions, and pelvic cul-de-sac. It was necessary to proceed with endovaginal exam following the transabdominal exam to visualize the right ovary. Color and duplex Doppler ultrasound was utilized to evaluate blood flow to the ovaries. COMPARISON:  06/10/2015 CT, pelvic ultrasound 01/30/2015 FINDINGS: Uterus Measurements: 8.3 x 3.7 x 5.4 cm. Pedunculated posterior left-sided fibroid measuring 4.9 x 4.9 x 5.2 cm. Endometrium Thickness: 11.7 mm.  No focal abnormality visualized. Right ovary Measurements: 5.2 x 3.6 x 4 cm. Homogeneously hypoechoic complex cysts of the right ovary consistent with an endometrioma measuring 2.5 x 2.5 x 2.6 cm. Left ovary Surgically absent. Pulsed Doppler evaluation of the right ovary demonstrates normal low-resistance arterial and venous waveforms. Other findings No abnormal free fluid. IMPRESSION: 1. Complex right ovarian 2.5 x 2.5 x 2.6 cm homogeneous, hypoechoic cyst consistent with an endometrioma. No torsion. 2. Pedunculated left-sided posterior uterine fibroid measuring 4.9 x 4.9 x 5.2 cm without interval enlargement since prior CT (previously estimated at 7.5 x 4.5 cm 2 orthogonal planes). Electronically Signed   By: Ashley Royalty M.D.   On: 05/23/2017 00:54   [place] Art/ven Flow Abd Pelv Doppler  Result Date: 05/23/2017 CLINICAL DATA:  Right-sided pelvic pain.  History of endometriosis. EXAM: TRANSABDOMINAL AND TRANSVAGINAL ULTRASOUND OF PELVIS DOPPLER ULTRASOUND OF OVARIES TECHNIQUE: Both transabdominal and transvaginal ultrasound examinations of the pelvis were performed. Transabdominal technique was performed for global imaging of the pelvis including uterus, ovaries, adnexal regions, and pelvic cul-de-sac. It was necessary to proceed with endovaginal exam following the transabdominal exam to visualize the right ovary. Color and duplex Doppler ultrasound was utilized to evaluate blood flow to the ovaries.  COMPARISON:  06/10/2015 CT, pelvic ultrasound 01/30/2015 FINDINGS: Uterus Measurements: 8.3 x 3.7 x 5.4 cm. Pedunculated posterior left-sided fibroid measuring 4.9 x 4.9 x 5.2 cm. Endometrium Thickness: 11.7 mm.  No focal abnormality visualized. Right ovary Measurements: 5.2 x 3.6 x 4 cm. Homogeneously hypoechoic complex cysts of the right ovary consistent with an endometrioma measuring 2.5 x 2.5 x 2.6 cm. Left ovary Surgically absent. Pulsed Doppler evaluation of the right ovary demonstrates normal low-resistance arterial and venous waveforms. Other findings No abnormal free fluid. IMPRESSION: 1. Complex right ovarian 2.5 x 2.5 x 2.6 cm homogeneous, hypoechoic cyst consistent with an endometrioma. No torsion. 2. Pedunculated left-sided posterior uterine fibroid measuring 4.9 x  4.9 x 5.2 cm without interval enlargement since prior CT (previously estimated at 7.5 x 4.5 cm 2 orthogonal planes). Electronically Signed   By: Ashley Royalty M.D.   On: 05/23/2017 00:54    Procedures Procedures (including critical care time)  Medications Ordered in ED Medications  oxyCODONE (Oxy IR/ROXICODONE) immediate release tablet 5 mg (not administered)  oxyCODONE-acetaminophen (PERCOCET/ROXICET) 5-325 MG per tablet 2 tablet (2 tablets Oral Given 05/22/17 2307)     Initial Impression / Assessment and Plan / ED Course  I have reviewed the triage vital signs and the nursing notes.  Pertinent labs & imaging results that were available during my care of the patient were reviewed by me and considered in my medical decision making (see chart for details).     She presents with lower abdominal pain. She is a history of endometriosis and has had her left ovary removed for same. She reports sharp and stabbing pain in the right lower quadrant/right pelvic area. Patient without cervical motion tenderness or clinical evidence of PID. She reports low risk for STD. Urinalysis without evidence of urinary tract infection. Labs are  reassuring.  Patient with right adnexal tenderness. Ultrasound shows complex right ovarian cyst consistent with endometrioma. Patient has a history of this. Also with left-sided pedunculated uterine fibroid. Patient's pain is well controlled here in the emergency department. No emesis. She is afebrile. Patient initially with hypertension however this has resolved spontaneously. Patient was given a copy of her labs and ultrasound. She will need to follow-up with her OB/GYN for discussion of management for her endometrioma. There is no evidence of ovarian torsion today. Patient will be given short course of pain control. She states understanding and is in agreement with the plan. Discussed reasons to return immediately to the emergency department.  Final Clinical Impressions(s) / ED Diagnoses   Final diagnoses:  Lower abdominal pain  Endometrioma of ovary    New Prescriptions New Prescriptions   HYDROCODONE-ACETAMINOPHEN (NORCO/VICODIN) 5-325 MG TABLET    Take 1-2 tablets by mouth every 4 (four) hours as needed.   ONDANSETRON (ZOFRAN ODT) 8 MG DISINTEGRATING TABLET    8mg  ODT q4 hours prn nausea     Agapito Games 05/23/17 8527    Sherwood Gambler, MD 05/24/17 2324

## 2017-05-22 NOTE — ED Triage Notes (Signed)
Pt states she has had lower abdominal pain x several weeks that has progressively worsened. Denies vaginal or urinary symptoms. Alert and oriented.

## 2017-05-23 ENCOUNTER — Emergency Department (HOSPITAL_COMMUNITY): Payer: 59

## 2017-05-23 LAB — GC/CHLAMYDIA PROBE AMP (~~LOC~~) NOT AT ARMC
Chlamydia: NEGATIVE
Neisseria Gonorrhea: NEGATIVE

## 2017-05-23 MED ORDER — HYDROCODONE-ACETAMINOPHEN 5-325 MG PO TABS: 1.0000 | ORAL_TABLET | ORAL | 0 refills | Status: DC | PRN

## 2017-05-23 MED ORDER — OXYCODONE HCL 5 MG PO TABS
5.0000 mg | ORAL_TABLET | Freq: Once | ORAL | Status: AC
  Administered 2017-05-23: 5 mg via ORAL
  Filled 2017-05-23: qty 1

## 2017-05-23 MED ORDER — ONDANSETRON 8 MG PO TBDP: ORAL_TABLET | ORAL | 0 refills | Status: DC

## 2017-05-23 NOTE — Discharge Instructions (Signed)
1. Medications: zofran, vicodin, usual home medications 2. Treatment: rest, drink plenty of fluids, advance diet slowly 3. Follow Up: Please followup with your primary doctor in 2 days for discussion of your diagnoses and further evaluation after today's visit; if you do not have a primary care doctor use the resource guide provided to find one; Please return to the ER for persistent vomiting, high fevers or worsening symptoms  

## 2017-05-24 ENCOUNTER — Other Ambulatory Visit: Payer: Self-pay | Admitting: Medical

## 2017-05-24 DIAGNOSIS — N809 Endometriosis, unspecified: Secondary | ICD-10-CM

## 2017-05-24 MED ORDER — NAPROXEN 500 MG PO TABS: 500.0000 mg | ORAL_TABLET | Freq: Two times a day (BID) | ORAL | 0 refills | Status: DC

## 2017-06-06 ENCOUNTER — Other Ambulatory Visit: Payer: Self-pay | Admitting: Medical

## 2017-06-19 ENCOUNTER — Ambulatory Visit: Payer: 59 | Admitting: Obstetrics & Gynecology

## 2017-09-12 ENCOUNTER — Ambulatory Visit: Payer: 59 | Admitting: Medical

## 2017-09-12 ENCOUNTER — Encounter: Payer: Self-pay | Admitting: Medical

## 2017-09-12 VITALS — BP 132/80 | HR 67 | Wt 253.6 lb

## 2017-09-12 DIAGNOSIS — Z113 Encounter for screening for infections with a predominantly sexual mode of transmission: Secondary | ICD-10-CM

## 2017-09-12 DIAGNOSIS — N809 Endometriosis, unspecified: Secondary | ICD-10-CM

## 2017-09-12 DIAGNOSIS — D259 Leiomyoma of uterus, unspecified: Secondary | ICD-10-CM | POA: Diagnosis not present

## 2017-09-12 DIAGNOSIS — N83201 Unspecified ovarian cyst, right side: Secondary | ICD-10-CM

## 2017-09-12 DIAGNOSIS — R102 Pelvic and perineal pain: Secondary | ICD-10-CM | POA: Diagnosis not present

## 2017-09-12 MED ORDER — NAPROXEN 500 MG PO TABS: 500.0000 mg | ORAL_TABLET | Freq: Two times a day (BID) | ORAL | 0 refills | Status: DC

## 2017-09-12 NOTE — Progress Notes (Signed)
Subjective: Chief Complaint  Patient presents with  . spottting    had blood after sex yesterday started yesterday    Here for c/o sharp vaginal pains, bleeding vaginally.  She notes having intercourse yesterday with her new partner she has been dating 2-3 months.  Prior to yesterday, last intercourse was 4-5 months ago with prior partner of a year.   She denies vaginal discharge.  She does have hx/o endometriosis, fibroids and ovarian cyst and prior surgery for cyst and fibroids.   Periods are regular but usually heavy first few days of the period.   LMP 1.5 weeks ago.   However, after intercourse yesterday had several episodes of sharp pelvic pains lasting for seconds at a time.  She also noticed blood on tissue after wiping yesterday and this morning.  The sexual activity yesterday was not rough or dry.    No fever.  Not currently on birth control for the past several months.   Last OCP was Norethindrone.  No other aggravating or relieving factors. No other complaint.  Past Surgical History:  Procedure Laterality Date  . CHROMOPERTUBATION  04/24/2015   Procedure: CHROMOPERTUBATION;  Surgeon: Governor Specking, MD;  Location: [facility];  Service: Gynecology;;  . LAPAROSCOPIC LYSIS OF ADHESIONS  04/24/2015   Procedure: LAPAROSCOPIC LYSIS OF ADHESIONS AND EXCISION ENDOMETRIOTIC IMPLANTS;  Surgeon: Governor Specking, MD;  Location: [place];  Service: Gynecology;;  . LAPAROSCOPIC OVARIAN CYSTECTOMY Right 04/24/2015   Procedure: LAPAROSCOPIC, RIGHT OVARIAN CYSTECTOMY,  GEL PORT ASSISTED MYOMECTOMY;  Surgeon: Governor Specking, MD;  Location: [place];  Service: Gynecology;  Laterality: Right;  . LAPAROSCOPY N/A 12/25/2013   Procedure: OPERATIVE LAPAROSCOPY;  Surgeon: Osborne Oman, MD;  Location: [facility];  Service: Gynecology;  Laterality: N/A;  . SALPINGOOPHORECTOMY Left 12/25/2013   Procedure: SALPINGO OOPHORECTOMY;  Surgeon: Osborne Oman, MD;   Location: [facility];  Service: Gynecology;  Laterality: Left;   Current Outpatient Medications on File Prior to Visit  Medication Sig Dispense Refill  . fluticasone (FLONASE) 50 MCG/ACT nasal spray Place 1 spray into both nostrils daily.    . fluconazole (DIFLUCAN) 150 MG tablet 1 tablet now, may repeat in 1 week 2 tablet 0  . norethindrone (AYGESTIN) 5 MG tablet Take 0.5 tablets (2.5 mg total) by mouth daily. CANCEL 28 tablet 11   No current facility-administered medications on file prior to visit.    ROS as in subjective   Objective: BP 132/80   Pulse 67   Wt 253 lb 9.6 oz (115 kg)   SpO2 97%   BMI 41.56 kg/m   Gen: wd, wn, nad Gyn: Normal external genitalia without lesions, vagina with normal mucosa, cervix without lesions, no cervical motion tenderness, there is moderate bleeding from os.  Uterus and adnexa not enlarged, tender left adnexal region,  no masses. Swabs taken.  Exam chaperoned by nurse.     Assessment; Encounter Diagnoses  Name Primary?  . Pelvic pain Yes  . Screen for STD (sexually transmitted disease)   . Uterine leiomyoma, unspecified location   . Cyst of right ovary   . Endometriosis     Plan: Discussed symptoms, findings.  Labs today, referral back to gynecology.  I suspect her current symptoms are related to ovarian cyst and fibroid history.  Will call with lab results.   In the meantime, use naprosyn below for  Bleeding and pain.   Frances Hill was seen today for spottting.  Diagnoses and all orders for  this visit:  Pelvic pain -     HIV antibody -     RPR -     C. trachomatis/N. gonorrhoeae RNA -     Ambulatory referral to Gynecology  Screen for STD (sexually transmitted disease) -     HIV antibody -     RPR -     C. trachomatis/N. gonorrhoeae RNA -     Ambulatory referral to Gynecology  Uterine leiomyoma, unspecified location -     Ambulatory referral to Gynecology  Cyst of right ovary -     Ambulatory referral to  Gynecology  Endometriosis -     naproxen (NAPROSYN) 500 MG tablet; Take 1 tablet (500 mg total) by mouth 2 (two) times daily with a meal. As needed for pain -     Ambulatory referral to Gynecology

## 2017-09-13 ENCOUNTER — Inpatient Hospital Stay (HOSPITAL_COMMUNITY)
Admission: AD | Admit: 2017-09-13 | Discharge: 2017-09-13 | Disposition: A | Discharge status: Home / Self Care | Payer: 59 | Source: Ambulatory Visit | Attending: Family Medicine | Admitting: Family Medicine

## 2017-09-13 ENCOUNTER — Encounter (HOSPITAL_COMMUNITY): Payer: Self-pay

## 2017-09-13 ENCOUNTER — Other Ambulatory Visit: Payer: Self-pay

## 2017-09-13 DIAGNOSIS — R102 Pelvic and perineal pain: Secondary | ICD-10-CM

## 2017-09-13 DIAGNOSIS — N939 Abnormal uterine and vaginal bleeding, unspecified: Secondary | ICD-10-CM | POA: Insufficient documentation

## 2017-09-13 DIAGNOSIS — G8929 Other chronic pain: Secondary | ICD-10-CM

## 2017-09-13 DIAGNOSIS — D219 Benign neoplasm of connective and other soft tissue, unspecified: Secondary | ICD-10-CM

## 2017-09-13 DIAGNOSIS — N809 Endometriosis, unspecified: Secondary | ICD-10-CM

## 2017-09-13 DIAGNOSIS — F17210 Nicotine dependence, cigarettes, uncomplicated: Secondary | ICD-10-CM | POA: Diagnosis not present

## 2017-09-13 DIAGNOSIS — Z3202 Encounter for pregnancy test, result negative: Secondary | ICD-10-CM | POA: Insufficient documentation

## 2017-09-13 DIAGNOSIS — N83201 Unspecified ovarian cyst, right side: Secondary | ICD-10-CM

## 2017-09-13 DIAGNOSIS — N80109 Endometriosis of ovary, unspecified side, unspecified depth: Secondary | ICD-10-CM

## 2017-09-13 DIAGNOSIS — D259 Leiomyoma of uterus, unspecified: Secondary | ICD-10-CM | POA: Diagnosis not present

## 2017-09-13 DIAGNOSIS — N801 Endometriosis of ovary: Secondary | ICD-10-CM

## 2017-09-13 HISTORY — DX: Benign neoplasm of connective and other soft tissue, unspecified: D21.9

## 2017-09-13 LAB — C. TRACHOMATIS/N. GONORRHOEAE RNA
C. trachomatis RNA, TMA: NOT DETECTED
N. gonorrhoeae RNA, TMA: NOT DETECTED

## 2017-09-13 LAB — URINALYSIS, ROUTINE W REFLEX MICROSCOPIC
Bilirubin Urine: NEGATIVE
Glucose, UA: NEGATIVE mg/dL
Ketones, ur: NEGATIVE mg/dL
Leukocytes, UA: NEGATIVE
Nitrite: NEGATIVE
Protein, ur: NEGATIVE mg/dL
Specific Gravity, Urine: 1.015 (ref 1.005–1.030)
pH: 7 (ref 5.0–8.0)

## 2017-09-13 LAB — POCT PREGNANCY, URINE: Preg Test, Ur: NEGATIVE

## 2017-09-13 LAB — RPR: RPR Ser Ql: NONREACTIVE

## 2017-09-13 LAB — HIV ANTIBODY (ROUTINE TESTING W REFLEX): HIV 1&2 Ab, 4th Generation: NONREACTIVE

## 2017-09-13 MED ORDER — TRAMADOL HCL 50 MG PO TABS: 50.0000 mg | ORAL_TABLET | Freq: Four times a day (QID) | ORAL | 0 refills | Status: DC | PRN

## 2017-09-13 MED ORDER — IBUPROFEN 600 MG PO TABS: 600.0000 mg | ORAL_TABLET | Freq: Four times a day (QID) | ORAL | 0 refills | Status: DC | PRN

## 2017-09-13 NOTE — Discharge Instructions (Signed)
Uterine Fibroids Uterine fibroids are tissue masses (tumors). They are also called leiomyomas. They can develop inside of a woman's womb (uterus). They can grow very large. Fibroids are not cancerous (benign). Most fibroids do not require medical treatment. Follow these instructions at home:  Keep all follow-up visits as told by your doctor. This is important.  Take medicines only as told by your doctor. ? If you were prescribed a hormone treatment, take the hormone medicines exactly as told. ? Do not take aspirin. It can cause bleeding.  Ask your doctor about taking iron pills and increasing the amount of dark green, leafy vegetables in your diet. These actions can help to boost your blood iron levels.  Pay close attention to your period. Tell your doctor about any changes, such as: ? Increased blood flow. This may require you to use more pads or tampons than usual per month. ? A change in the number of days that your period lasts per month. ? A change in symptoms that come with your period, such as back pain or cramping in your belly area (abdomen). Contact a doctor if:  You have pain in your back or the area between your hip bones (pelvic area) that is not controlled by medicines.  You have pain in your abdomen that is not controlled with medicines.  You have an increase in bleeding between and during periods.  You soak tampons or pads in a half hour or less.  You feel lightheaded.  You feel extra tired.  You feel weak. Get help right away if:  You pass out (faint).  You have a sudden increase in pelvic pain. This information is not intended to replace advice given to you by your health care provider. Make sure you discuss any questions you have with your health care provider. Document Released: 10/22/2010 Document Revised: 05/20/2016 Document Reviewed: 03/18/2014 Elsevier Interactive Patient Education  2018 [employer]  

## 2017-09-13 NOTE — MAU Note (Signed)
Sharp stabbing vaginal pain for a couple of months- intermittent Lower back pain for a couple months- intermittent Cyst on ovary Some bleeding since yesterday, period 2 weeks ago Hand and foot swelling

## 2017-09-13 NOTE — MAU Provider Note (Signed)
History     CSN: [id number]  Arrival date and time: 10/15/13 0847   First Provider Initiated Contact with Patient 10/15/13 1017      Chief Complaint  Patient presents with  . Abdominal Pain   HPI  Patient is a 30 y.o. G0P0 with PMH of endometriosis, fibroids, and ovarian cysts who presents with vaginal/uterine pain that has persisted for a few months. She describes the pain as s "sharp, shooting" pain that occurs daily, and describes a "heaviness" in her lower abdomen. She also complains of lower back pain that she believes is related to her endometriosis. Additionally, over the past few months she reports pain with urination and increased frequency of urination, going frequently during the day and 3-4x per night. She denies any change in color or odor of her urine. For the past two days she has has noticed "bright red" blood from her vagina. Her LMP was 1.5-2 weeks ago and she denies having spotting between periods in the past. She also complains of occasional tingling/swelling in her hands and feet that occurs approximately once a week and lasts 20-30 minutes. She was seen by her PCP yesterday. A pelvic exam was done and tests for G/C, HIV, and syphilis were negative.    Past Medical History  Diagnosis Date  . Fibroid     Past Surgical History  Procedure Laterality Date  . Breast surgery      left side had cyst, non cancerous  . Oophorectomy      unsure of which side, had cyst and needed ovary removed    Family History  Problem Relation Age of Onset  . Cancer Sister 33    breast  . Cancer Paternal Grandmother     breast    History  Substance Use Topics  . Smoking status: Current Every Day Smoker -- 0.50 packs/day for 25 years    Types: Cigarettes  . Smokeless tobacco: Never Used  . Alcohol Use: No    Allergies: No Known Allergies  No prescriptions prior to admission    ROS Physical Exam   Blood pressure 150/67, pulse 81, temperature 98.5 F (36.9 C),  temperature source Oral, resp. rate 18, height '5\' 4"'$  (1.626 m), weight 79.198 kg (174 lb 9.6 oz), last menstrual period 06/21/2013.  Physical Exam Gen: Well-appearing, no acute distress Cardio: Regular rate and rhythm, no murmurs Lungs: Clear to auscultation Abd: normal bowel sounds present, abdomen soft, mild tenderness to palpation in the RLQ Ext: negative for swelling Psych: Patient tearful when describing symptoms, states worry that something is "very wrong" Pelvic exam deferred as this was done yesterday by PCP office MAU Course  Procedures  MDM A UA was collected given pt's urinary symptoms and was reassuring. Pregnancy test was negative.    Assessment and Plan  Patient's symptoms of abdominal heaviness, uterine/vaginal pain, irregular bleeding, and urinary symptoms are likely caused by her known diagnosis of fibroids. A UTI or pyelonephritis is unlikely given the reassuring UA. PID is unlikely given negative G/C and lack of cervical motion tenderness on pelvic exam performed yesterday.  Patient has an ultrasound appointment and gyn follow-up appointment on Monday morning for further evaluation of pelvic anatomy and symptoms.  Senaida Lange, MS3  I have seen this patient and agree with the above student's note.  I was present and participated in the assessment and physical exam.  After obtaining the pt health history I discussed treatment of fibroids with pt as these are likely the cause of her  pelvic/back pain. The pt asked to see another provider as she felt her needs were not being met and that her pain is more than the fibroid.  Report to Noni Saupe, NP, who assumes care of the pt.    LEFTWICH-KIRBY, LISA Certified Nurse-Midwife  I evaluated this patient after the patient voiced unhappiness with care in the MAU. When I entered the room, the patient was fully dressed, sitting on the bed crying. States she is worried about her symptoms and wants to feel like she is being heard  and taken care off. I apologized and emphasized that I would do all that I could to make sure these were met today. I offered the patient a very quick appointment for a pelvic US this Friday at 0900, with a follow up add on appointment in our office on Monday 12/17. The patient was very appreciative and thanked me for helping her. An RX for ibuprofen and tramadol was sent home with the patient for pain management. She was instructed to return to MAU if needed, if her symptoms worsened.  Noni Saupe I, NP 09/14/2017 10:22 AM

## 2017-09-15 ENCOUNTER — Ambulatory Visit (HOSPITAL_COMMUNITY)
Admission: RE | Admit: 2017-09-15 | Discharge: 2017-09-15 | Disposition: A | Discharge status: Home / Self Care | Payer: 59 | Source: Ambulatory Visit | Attending: Obstetrics and Gynecology | Admitting: Obstetrics and Gynecology

## 2017-09-15 ENCOUNTER — Other Ambulatory Visit (HOSPITAL_COMMUNITY): Payer: Self-pay | Admitting: Obstetrics and Gynecology

## 2017-09-15 DIAGNOSIS — D259 Leiomyoma of uterus, unspecified: Secondary | ICD-10-CM | POA: Insufficient documentation

## 2017-09-15 DIAGNOSIS — R102 Pelvic and perineal pain: Secondary | ICD-10-CM | POA: Diagnosis present

## 2017-09-15 DIAGNOSIS — N80109 Endometriosis of ovary, unspecified side, unspecified depth: Secondary | ICD-10-CM

## 2017-09-15 DIAGNOSIS — N801 Endometriosis of ovary: Secondary | ICD-10-CM | POA: Diagnosis present

## 2017-09-15 DIAGNOSIS — G8929 Other chronic pain: Secondary | ICD-10-CM

## 2017-09-15 DIAGNOSIS — N809 Endometriosis, unspecified: Secondary | ICD-10-CM

## 2017-09-15 DIAGNOSIS — Z90721 Acquired absence of ovaries, unilateral: Secondary | ICD-10-CM | POA: Insufficient documentation

## 2017-09-15 DIAGNOSIS — N83201 Unspecified ovarian cyst, right side: Secondary | ICD-10-CM

## 2017-09-18 ENCOUNTER — Ambulatory Visit (INDEPENDENT_AMBULATORY_CARE_PROVIDER_SITE_OTHER): Payer: 59 | Admitting: Obstetrics and Gynecology

## 2017-09-18 ENCOUNTER — Encounter: Payer: Self-pay | Admitting: Obstetrics and Gynecology

## 2017-09-18 VITALS — BP 149/69 | HR 68 | Ht 67.0 in | Wt 255.6 lb

## 2017-09-18 DIAGNOSIS — R102 Pelvic and perineal pain: Secondary | ICD-10-CM

## 2017-09-18 DIAGNOSIS — Z113 Encounter for screening for infections with a predominantly sexual mode of transmission: Secondary | ICD-10-CM

## 2017-09-18 NOTE — Progress Notes (Signed)
GYNECOLOGY OFFICE VISIT NOTE  History:  31 y.o. G0P0000 here today for f/u from MAU. She is here for occasional right sided pain and intermittent stabbing pain in vagina. Also with one month of 3 days of intermenstrual bleeding. Some discharge, denies other concerns.   Past Medical History:  Diagnosis Date  . Anxiety   . Cyst of right ovary   . Endometriosis   . Fibroids   . History of ovarian cyst    12-25-2013  s/p removal left ovarian endometrioma  . History of pelvic inflammatory disease   . History of trichomoniasis   . Sickle cell trait [facility])     Past Surgical History:  Procedure Laterality Date  . CHROMOPERTUBATION  04/24/2015   Procedure: CHROMOPERTUBATION;  Surgeon: Governor Specking, MD;  Location: [facility];  Service: Gynecology;;  . LAPAROSCOPIC LYSIS OF ADHESIONS  04/24/2015   Procedure: LAPAROSCOPIC LYSIS OF ADHESIONS AND EXCISION ENDOMETRIOTIC IMPLANTS;  Surgeon: Governor Specking, MD;  Location: [place];  Service: Gynecology;;  . LAPAROSCOPIC OVARIAN CYSTECTOMY Right 04/24/2015   Procedure: LAPAROSCOPIC, RIGHT OVARIAN CYSTECTOMY,  GEL PORT ASSISTED MYOMECTOMY;  Surgeon: Governor Specking, MD;  Location: [place];  Service: Gynecology;  Laterality: Right;  . LAPAROSCOPY N/A 12/25/2013   Procedure: OPERATIVE LAPAROSCOPY;  Surgeon: Osborne Oman, MD;  Location: [facility];  Service: Gynecology;  Laterality: N/A;  . SALPINGOOPHORECTOMY Left 12/25/2013   Procedure: SALPINGO OOPHORECTOMY;  Surgeon: Osborne Oman, MD;  Location: [facility];  Service: Gynecology;  Laterality: Left;     Current Outpatient Medications:  .  fluticasone (FLONASE) 50 MCG/ACT nasal spray, Place 1 spray into both nostrils daily as needed for rhinitis. , Disp: , Rfl:  .  ibuprofen (ADVIL,MOTRIN) 600 MG tablet, Take 1 tablet (600 mg total) by mouth every 6 (six) hours as needed., Disp: 30 tablet, Rfl: 0 .  traMADol (ULTRAM) 50 MG tablet, Take 1 tablet  (50 mg total) by mouth every 6 (six) hours as needed., Disp: 15 tablet, Rfl: 0 .  valACYclovir (VALTREX) 500 MG tablet, Take 1 tablet by mouth 2 (two) times daily as needed (cold sore). , Disp: , Rfl:   The following portions of the patient's history were reviewed and updated as appropriate: allergies, current medications, past family history, past medical history, past social history, past surgical history and problem list.   Health Maintenance:  Last pap: normal 10/2016 Last mammogram: n/a  Review of Systems:  Pertinent items noted in HPI and remainder of comprehensive ROS otherwise negative.   Objective:  Physical Exam BP (!) 149/69   Pulse 68   Ht 5\' 7"  (1.702 m)   Wt 255 lb 9.6 oz (115.9 kg)   LMP 08/30/2017 (Approximate)   BMI 40.03 kg/m  CONSTITUTIONAL: Well-developed, well-nourished female in no acute distress.  HENT:  Normocephalic, atraumatic. External right and left ear normal. Oropharynx is clear and moist EYES: Conjunctivae and EOM are normal. Pupils are equal, round, and reactive to light. No scleral icterus.  NECK: Normal range of motion, supple, no masses SKIN: Skin is warm and dry. No rash noted. Not diaphoretic. No erythema. No pallor. NEUROLOGIC: Alert and oriented to person, place, and time. Normal reflexes, muscle tone coordination. No cranial nerve deficit noted. PSYCHIATRIC: Normal mood and affect. Normal behavior. Normal judgment and thought content. CARDIOVASCULAR: Normal heart rate noted RESPIRATORY: Effort normal, no problems with respiration noted ABDOMEN: Soft, no distention noted.   PELVIC: normal appearing external female genitalia, nulliparous normal appearing  cervix, vagina with white discharge, uterus frozen in pelvis, non-mobile, generalized tenderness with bimanual, no palpable lesions noted in adnexa but patient uncomfortable with exam MUSCULOSKELETAL: Normal range of motion. No edema noted.  Labs and Imaging US Pelvis Transvanginal Non-ob (tv  Only)  Result Date: 09/15/2017 CLINICAL DATA:  31 year old female with uterine fibroids, endometriosis and chronic sharp right lower quadrant pain. Prior myomectomy and left salpingo-oophorectomy. LMP 08/30/2017 EXAM: ULTRASOUND PELVIS TRANSVAGINAL TECHNIQUE: Transvaginal ultrasound examination of the pelvis was performed including evaluation of the uterus, ovaries, adnexal regions, and pelvic cul-de-sac. COMPARISON:  05/23/2017 pelvic sonogram. FINDINGS: Uterus Measurements: 10.3 x 5.1 x 5.2 cm. Enlarged myomatous uterus with fibroids as follows: - right uterine body intramural 1.5 x 1.3 x 1.2 cm fibroid, not previously described - pedunculated 5.0 x 3.4 x 4.8 cm fibroid posterior to the left uterine body, previously 4.9 x 4.4 x 5.1 cm, not appreciably changed Endometrium Thickness: 12 mm. No endometrial cavity fluid or focal endometrial mass. Right ovary Measurements: 6.4 x 5.8 x 6.1 cm. Hypoechoic 5.3 x 4.8 x 5.4 cm right ovarian mass with uniform low level internal echoes and no internal vascularity, compatible with the right ovarian endometrioma, increased from 2.5 x 2.5 x 2.6 cm. No additional right ovarian or right adnexal masses. Left ovary Surgically absent.  No left adnexal mass. Other findings:  No abnormal free fluid IMPRESSION: 1. Interval growth of right ovarian endometrioma, now 5.4 cm. Continued sonographic surveillance warranted if not surgically resected. 2. Left oophorectomy.  No left adnexal mass. 3. Stable enlarged myomatous uterus with dominant pedunculated 5.0 cm posterior uterine fibroid. 4. Bilayer endometrial thickness 12 mm.  No focal endometrial mass. Electronically Signed   By: Ilona Sorrel M.D.   On: 09/15/2017 13:15    Assessment & Plan:   1. Vaginal pain - likely endometrial flare - had frank conversation with patient regarding goals, she would like future fertility and has seen REI in past who reviewed options for treatment for endometriosis with her but she has not pursued  any treatment (did not want to do lupron or CHCs rec by REI). Reviewed that there are options for treatment but they all involve suppressing ovulation and with her goal to have children at some point, she should consult with REI regarding options. Reviewed that we can remove endometrioma from right ovary, but given that it has increased in size, surgery may necessitate removal of entire ovary, which would be contra-indicated to her goals of future fertility. Strongly recommended she review options with REI regarding options. She is hesitant as she does not have a partner at this point and is not currently ready to have children, however given her significant history for endometriosis, recommend discussion of fertility options with her prior to any other management of endometriosis. She is agreeable, has seen Dr. Kerin Perna in the past, gave names of other REI in town  2. Intermenstrual bleeding - likely 2/2 endometriosis To REI  3. Routine  Declines STI screen Pap UTD  4. Discharge BV/yeast sent  Routine preventative health maintenance measures emphasized. Please refer to After Visit Summary for other counseling recommendations.     Feliz Beam, M.D. Attending [place], [facility] for [employer], [place]

## 2017-09-18 NOTE — Patient Instructions (Addendum)
Options for Infertility Dr. Kerin Perna - [facility]: [facility]: [phone]

## 2017-09-18 NOTE — Progress Notes (Signed)
Pt.advised of spotting between periods, bright red. Also states having stabbing in vagina & pain with endometriosis.

## 2017-09-19 LAB — CERVICOVAGINAL ANCILLARY ONLY
Bacterial vaginitis: POSITIVE — AB
Candida vaginitis: NEGATIVE
Trichomonas: NEGATIVE

## 2017-09-21 ENCOUNTER — Other Ambulatory Visit: Payer: Self-pay | Admitting: Obstetrics and Gynecology

## 2017-09-21 MED ORDER — METRONIDAZOLE 500 MG PO TABS: 500.0000 mg | ORAL_TABLET | Freq: Two times a day (BID) | ORAL | 0 refills | Status: DC

## 2017-09-21 NOTE — Progress Notes (Signed)
Flagyl sent for BV.

## 2017-10-18 DIAGNOSIS — E2839 Other primary ovarian failure: Secondary | ICD-10-CM | POA: Diagnosis not present

## 2017-10-18 DIAGNOSIS — D251 Intramural leiomyoma of uterus: Secondary | ICD-10-CM | POA: Diagnosis not present

## 2017-10-18 DIAGNOSIS — N801 Endometriosis of ovary: Secondary | ICD-10-CM | POA: Diagnosis not present

## 2017-10-18 DIAGNOSIS — N83291 Other ovarian cyst, right side: Secondary | ICD-10-CM | POA: Diagnosis not present

## 2017-10-18 DIAGNOSIS — D259 Leiomyoma of uterus, unspecified: Secondary | ICD-10-CM | POA: Diagnosis not present

## 2017-11-01 DIAGNOSIS — J101 Influenza due to other identified influenza virus with other respiratory manifestations: Secondary | ICD-10-CM | POA: Diagnosis not present

## 2017-12-04 NOTE — Progress Notes (Deleted)
    Saw Dr. Rosana Hoes at [facility] for [facility] [place], in December. Known endometriosis [place] 12/14: IMPRESSION: 1. Interval growth of right ovarian endometrioma, now 5.4 cm. Continued sonographic surveillance warranted if not surgically resected. 2. Left oophorectomy.  No left adnexal mass. 3. Stable enlarged myomatous uterus with dominant pedunculated 5.0 cm posterior uterine fibroid. 4. Bilayer endometrial thickness 12 mm.  No focal endometrial mass.

## 2017-12-05 ENCOUNTER — Ambulatory Visit: Payer: 59 | Admitting: Family Medicine

## 2017-12-05 DIAGNOSIS — Z3200 Encounter for pregnancy test, result unknown: Secondary | ICD-10-CM | POA: Diagnosis not present

## 2017-12-05 DIAGNOSIS — Z3201 Encounter for pregnancy test, result positive: Secondary | ICD-10-CM | POA: Diagnosis not present

## 2017-12-07 ENCOUNTER — Encounter: Payer: Self-pay | Admitting: Family Medicine

## 2017-12-07 DIAGNOSIS — Z3201 Encounter for pregnancy test, result positive: Secondary | ICD-10-CM | POA: Diagnosis not present

## 2017-12-07 DIAGNOSIS — Z3200 Encounter for pregnancy test, result unknown: Secondary | ICD-10-CM | POA: Diagnosis not present

## 2017-12-13 DIAGNOSIS — N760 Acute vaginitis: Secondary | ICD-10-CM | POA: Diagnosis not present

## 2017-12-15 ENCOUNTER — Inpatient Hospital Stay (HOSPITAL_COMMUNITY): Payer: 59

## 2017-12-15 ENCOUNTER — Inpatient Hospital Stay (HOSPITAL_COMMUNITY)
Admission: AD | Admit: 2017-12-15 | Discharge: 2017-12-15 | Disposition: A | Discharge status: Home / Self Care | Payer: 59 | Source: Ambulatory Visit | Attending: Obstetrics & Gynecology | Admitting: Obstetrics & Gynecology

## 2017-12-15 ENCOUNTER — Other Ambulatory Visit: Payer: Self-pay

## 2017-12-15 ENCOUNTER — Encounter (HOSPITAL_COMMUNITY): Payer: Self-pay

## 2017-12-15 DIAGNOSIS — O99011 Anemia complicating pregnancy, first trimester: Secondary | ICD-10-CM | POA: Diagnosis not present

## 2017-12-15 DIAGNOSIS — O3680X0 Pregnancy with inconclusive fetal viability, not applicable or unspecified: Secondary | ICD-10-CM | POA: Insufficient documentation

## 2017-12-15 DIAGNOSIS — R109 Unspecified abdominal pain: Secondary | ICD-10-CM

## 2017-12-15 DIAGNOSIS — O200 Threatened abortion: Secondary | ICD-10-CM | POA: Diagnosis not present

## 2017-12-15 DIAGNOSIS — D259 Leiomyoma of uterus, unspecified: Secondary | ICD-10-CM | POA: Insufficient documentation

## 2017-12-15 DIAGNOSIS — O26899 Other specified pregnancy related conditions, unspecified trimester: Secondary | ICD-10-CM

## 2017-12-15 DIAGNOSIS — Z3201 Encounter for pregnancy test, result positive: Secondary | ICD-10-CM | POA: Diagnosis not present

## 2017-12-15 DIAGNOSIS — Z3490 Encounter for supervision of normal pregnancy, unspecified, unspecified trimester: Secondary | ICD-10-CM

## 2017-12-15 DIAGNOSIS — Z90721 Acquired absence of ovaries, unilateral: Secondary | ICD-10-CM | POA: Diagnosis not present

## 2017-12-15 DIAGNOSIS — R1031 Right lower quadrant pain: Secondary | ICD-10-CM | POA: Diagnosis present

## 2017-12-15 DIAGNOSIS — Z8619 Personal history of other infectious and parasitic diseases: Secondary | ICD-10-CM | POA: Insufficient documentation

## 2017-12-15 DIAGNOSIS — N83201 Unspecified ovarian cyst, right side: Secondary | ICD-10-CM | POA: Insufficient documentation

## 2017-12-15 DIAGNOSIS — D571 Sickle-cell disease without crisis: Secondary | ICD-10-CM | POA: Insufficient documentation

## 2017-12-15 DIAGNOSIS — Z3A01 Less than 8 weeks gestation of pregnancy: Secondary | ICD-10-CM | POA: Diagnosis not present

## 2017-12-15 DIAGNOSIS — Z3A00 Weeks of gestation of pregnancy not specified: Secondary | ICD-10-CM | POA: Diagnosis not present

## 2017-12-15 DIAGNOSIS — Z9889 Other specified postprocedural states: Secondary | ICD-10-CM | POA: Diagnosis not present

## 2017-12-15 DIAGNOSIS — O3411 Maternal care for benign tumor of corpus uteri, first trimester: Secondary | ICD-10-CM | POA: Insufficient documentation

## 2017-12-15 DIAGNOSIS — Z3200 Encounter for pregnancy test, result unknown: Secondary | ICD-10-CM | POA: Diagnosis not present

## 2017-12-15 DIAGNOSIS — O26891 Other specified pregnancy related conditions, first trimester: Secondary | ICD-10-CM | POA: Diagnosis not present

## 2017-12-15 LAB — CBC
HCT: 38.2 % (ref 36.0–46.0)
Hemoglobin: 13 g/dL (ref 12.0–15.0)
MCH: 25.7 pg — ABNORMAL LOW (ref 26.0–34.0)
MCHC: 34 g/dL (ref 30.0–36.0)
MCV: 75.6 fL — ABNORMAL LOW (ref 78.0–100.0)
Platelets: 304 10*3/uL (ref 150–400)
RBC: 5.05 MIL/uL (ref 3.87–5.11)
RDW: 14 % (ref 11.5–15.5)
WBC: 7.9 10*3/uL (ref 4.0–10.5)

## 2017-12-15 LAB — URINALYSIS, ROUTINE W REFLEX MICROSCOPIC
Bilirubin Urine: NEGATIVE
Glucose, UA: NEGATIVE mg/dL
Hgb urine dipstick: NEGATIVE
Ketones, ur: NEGATIVE mg/dL
Nitrite: NEGATIVE
Protein, ur: NEGATIVE mg/dL
Specific Gravity, Urine: 1.016 (ref 1.005–1.030)
pH: 5 (ref 5.0–8.0)

## 2017-12-15 LAB — POCT PREGNANCY, URINE: Preg Test, Ur: POSITIVE — AB

## 2017-12-15 LAB — ABO/RH: ABO/RH(D): O POS

## 2017-12-15 LAB — HCG, QUANTITATIVE, PREGNANCY: hCG, Beta Chain, Quant, S: 962 m[IU]/mL — ABNORMAL HIGH (ref <5)

## 2017-12-15 NOTE — MAU Provider Note (Signed)
History     CSN: [id number]  Arrival date and time: 12/15/17 1728   First Provider Initiated Contact with Patient 12/15/17 1915      Chief Complaint  Patient presents with  . Abdominal Pain   HPI Frances Hill is a 32 y.o. G1P0000 at [redacted]w[redacted]d by LMP who presents with abdominal pain. Reports abdominal pain worse in RLQ for that last 2 weeks. Describes as pulling/pinching pain. Rates pain 6/10. Has not treated pain. Denies vaginal bleeding.  Being followed in Dr. Charlett Lango office. Was referred to him by Dr. Rosana Hoes for fertility issues. When she went to his office for her initial work up she was found to be pregnant. States she was told today that her HCG aren't rising appropriately and they didn't see anything on ultrasound today. Was told that she would need f/u labs & ultrasound on Monday. Patient presents tonight insistent on second opinion. States she doesn't wish to return to [facility] office & wants a work up done here; states she "just wants an answer".  OB History    Gravida Para Term Preterm AB Living   1 0 0 0 0 0   SAB TAB Ectopic Multiple Live Births   0 0 0 0        Past Medical History:  Diagnosis Date  . Anxiety   . Cyst of right ovary   . Endometriosis   . Fibroids   . History of ovarian cyst    12-25-2013  s/p removal left ovarian endometrioma  . History of pelvic inflammatory disease   . History of trichomoniasis   . Sickle cell trait [facility])     Past Surgical History:  Procedure Laterality Date  . CHROMOPERTUBATION  04/24/2015   Procedure: CHROMOPERTUBATION;  Surgeon: Governor Specking, MD;  Location[facility];  Service: Gynecology;;  . LAPAROSCOPIC LYSIS OF ADHESIONS  04/24/2015   Procedure: LAPAROSCOPIC LYSIS OF ADHESIONS AND EXCISION ENDOMETRIOTIC IMPLANTS;  Surgeon: Governor Specking, MD;  Location: [place];  Service: Gynecology;;  . LAPAROSCOPIC OVARIAN CYSTECTOMY Right 04/24/2015   Procedure: LAPAROSCOPIC, RIGHT  OVARIAN CYSTECTOMY,  GEL PORT ASSISTED MYOMECTOMY;  Surgeon: Governor Specking, MD;  Location: [place];  Service: Gynecology;  Laterality: Right;  . LAPAROSCOPY N/A 12/25/2013   Procedure: OPERATIVE LAPAROSCOPY;  Surgeon: Osborne Oman, MD;  Location: [facility] [facility];  Service: Gynecology;  Laterality: N/A;  . SALPINGOOPHORECTOMY Left 12/25/2013   Procedure: SALPINGO OOPHORECTOMY;  Surgeon: Osborne Oman, MD;  Location: [place] [facility];  Service: Gynecology;  Laterality: Left;    Family History  Problem Relation Age of Onset  . Aneurysm Mother 7       died of aneurysm, brain  . Cancer Mother 74       breast, twice  . Hypertension Father   . Depression Father   . Heart disease Father        valve disease  . Other Father        DDD  . Other Brother        DDD  . Sarcoidosis Brother   . Cancer Maternal Aunt        breast cancer  . Diabetes Neg Hx   . Stroke Neg Hx     Social History   Tobacco Use  . Smoking status: Never Smoker  . Smokeless tobacco: Never Used  Substance Use Topics  . Alcohol use: Yes    Comment: occasionally  . Drug use: No    Allergies: No Known  Allergies  Medications Prior to Admission  Medication Sig Dispense Refill Last Dose  . fluticasone (FLONASE) 50 MCG/ACT nasal spray Place 1 spray into both nostrils daily as needed for rhinitis.    Taking  . ibuprofen (ADVIL,MOTRIN) 600 MG tablet Take 1 tablet (600 mg total) by mouth every 6 (six) hours as needed. 30 tablet 0 Taking  . metroNIDAZOLE (FLAGYL) 500 MG tablet Take 1 tablet (500 mg total) by mouth 2 (two) times daily. 14 tablet 0   . traMADol (ULTRAM) 50 MG tablet Take 1 tablet (50 mg total) by mouth every 6 (six) hours as needed. 15 tablet 0 Taking  . valACYclovir (VALTREX) 500 MG tablet Take 1 tablet by mouth 2 (two) times daily as needed (cold sore).    Taking    Review of Systems  Constitutional: Negative.   Gastrointestinal: Positive for abdominal pain. Negative for diarrhea, nausea and  vomiting.  Genitourinary: Negative.    Physical Exam   Blood pressure (!) 146/90, pulse 85, temperature 98.6 F (37 C), temperature source Oral, resp. rate 18, height 5\' 6"  (1.676 m), weight 251 lb (113.9 kg), last menstrual period 10/22/2017, SpO2 100 %.  Physical Exam  Nursing note and vitals reviewed. Constitutional: She is oriented to person, place, and time. She appears well-developed and well-nourished. No distress.  HENT:  Head: Normocephalic and atraumatic.  Eyes: Conjunctivae are normal. Right eye exhibits no discharge. Left eye exhibits no discharge. No scleral icterus.  Neck: Normal range of motion.  Respiratory: Effort normal. No respiratory distress.  GI: Soft. There is no tenderness. There is no rebound and no guarding.  Neurological: She is alert and oriented to person, place, and time.  Skin: Skin is warm and dry. She is not diaphoretic.  Psychiatric: She has a normal mood and affect. Her behavior is normal. Judgment and thought content normal.    MAU Course  Procedures Results for orders placed or performed during the hospital encounter of 12/15/17 (from the past 24 hour(s))  Urinalysis, Routine w reflex microscopic     Status: Abnormal   Collection Time: 12/15/17  5:53 PM  Result Value Ref Range   Color, Urine YELLOW YELLOW   APPearance CLOUDY (A) CLEAR   Specific Gravity, Urine 1.016 1.005 - 1.030   pH 5.0 5.0 - 8.0   Glucose, UA NEGATIVE NEGATIVE mg/dL   Hgb urine dipstick NEGATIVE NEGATIVE   Bilirubin Urine NEGATIVE NEGATIVE   Ketones, ur NEGATIVE NEGATIVE mg/dL   Protein, ur NEGATIVE NEGATIVE mg/dL   Nitrite NEGATIVE NEGATIVE   Leukocytes, UA SMALL (A) NEGATIVE   RBC / HPF 0-5 0 - 5 RBC/hpf   WBC, UA 6-30 0 - 5 WBC/hpf   Bacteria, UA RARE (A) NONE SEEN   Squamous Epithelial / LPF 6-30 (A) NONE SEEN   Mucus PRESENT   Pregnancy, urine POC     Status: Abnormal   Collection Time: 12/15/17  6:30 PM  Result Value Ref Range   Preg Test, Ur POSITIVE (A)  NEGATIVE  CBC     Status: Abnormal   Collection Time: 12/15/17  7:54 PM  Result Value Ref Range   WBC 7.9 4.0 - 10.5 K/uL   RBC 5.05 3.87 - 5.11 MIL/uL   Hemoglobin 13.0 12.0 - 15.0 g/dL   HCT 38.2 36.0 - 46.0 %   MCV 75.6 (L) 78.0 - 100.0 fL   MCH 25.7 (L) 26.0 - 34.0 pg   MCHC 34.0 30.0 - 36.0 g/dL   RDW 14.0 11.5 -  15.5 %   Platelets 304 150 - 400 K/uL  ABO/Rh     Status: None   Collection Time: 12/15/17  7:54 PM  Result Value Ref Range   ABO/RH(D)      O POS Performed at [facility], [address] [place], [place] [address]   hCG, quantitative, pregnancy     Status: Abnormal   Collection Time: 12/15/17  7:54 PM  Result Value Ref Range   hCG, Beta Chain, Quant, S 962 (H) <5 mIU/mL   US Ob Less Than 14 Weeks With Ob Transvaginal  Result Date: 12/15/2017 CLINICAL DATA:  32 year old pregnant female presents with sharp abdominal pain. History of myomectomy. Reported history of left oophorectomy. EDC by LMP: 07/29/2017, projecting to an expected gestational age of [redacted] weeks 5 days. EXAM: OBSTETRIC <14 WK [place] AND TRANSVAGINAL OB US TECHNIQUE: Both transabdominal and transvaginal ultrasound examinations were performed for complete evaluation of the gestation as well as the maternal uterus, adnexal regions, and pelvic cul-de-sac. Transvaginal technique was performed to assess early pregnancy. COMPARISON:  No prior scans from this gestation. FINDINGS: Right ovary measures 4.7 x 4.5 x 5.1 cm and contains a complex 4.1 x 4.1 x 4.4 cm cystic mass with homogeneous low level internal echoes and no internal vascularity, which measured 5.3 x 4.8 x 5.4 cm on 09/15/2017 sonogram, mildly decreased. Left oophorectomy. No left adnexal mass. Mildly enlarged anteverted myomatous uterus. There is a pedunculated 4.9 x 4.1 x 5.7 cm fibroid posterior to the uterine body, previously 5.0 x 3.4 x 4.8 cm on 09/15/2017, not appreciably changed. Additional 2.0 x 1.6 x 1.8 cm intramural right uterine body fibroid.  Endometrium is somewhat indistinct. No evidence of an intrauterine gestational sac. No endometrial cavity fluid. No abnormal free fluid in pelvis. IMPRESSION: 1. Non-localization of the pregnancy on this scan. No intrauterine gestational sac. No unexpected adnexal masses. Sonographic differential diagnosis includes intrauterine gestation too early to visualize, spontaneous abortion or occult ectopic gestation. Recommend close clinical follow-up and serial serum beta HCG monitoring, with repeat obstetric scan as warranted by beta HCG levels and clinical assessment. 2. Enlarged myomatous uterus with stable pedunculated posterior uterine fibroid. 3. Complex 4.4 cm cystic right ovarian mass, mildly decreased in size since 09/15/2017 sonogram, compatible with right ovarian endometrioma. Electronically Signed   By: Ilona Sorrel M.D.   On: 12/15/2017 21:08    MDM CBC, HCG, abo/rh, & ultrasound ordered  Spoke with Dr. Kerin Perna regarding patient's previous labs.  HCGs 3/5=250, 3/7=571, 3/15=952. No IUP or ectopic on ultrasound today in office.   Reordered labs & ultrasound today. Discussed with patient that we will likely not be able to definitively diagnose what's going on with this pregnancy today and she will need follow up. Patient understands & states she would like to follow up with our office downstairs & not return to [place] office. Patient has previously been seen in our office for gyn visits.   HCG today 962. Ultrasound shows multiple uterine fibroids & right ovarian mass. This is consistent with previous ultrasound done in December. Right ovarian cyst slightly decreased in size since then. No evidence of IUP or ectopic.   Assessment and Plan  A: 1. Pregnancy of unknown anatomic location   2. Abdominal pain affecting pregnancy   3. Uterine fibroids affecting pregnancy in first trimester    P: Discharge home Discussed reasons to return to MAU including s/s ectopic pregnancy Pt to return  Monday morning to [place] for stat HCG  Jorje Guild  12/15/2017, 7:15 PM

## 2017-12-15 NOTE — Discharge Instructions (Signed)
Return to care  °· If you have heavier bleeding that soaks through more that 2 pads per hour for an hour or more °· If you bleed so much that you feel like you might pass out or you do pass out °· If you have significant abdominal pain that is not improved with Tylenol  °· If you develop a fever > 100.5 ° °

## 2017-12-15 NOTE — MAU Note (Signed)
Pt has been having chest pain today that is constant. 6/10, feels pinching. Right lower abdominal pain having stabbing pain. Was going to have an endometriosis study 3 weeks ago and found out she was pregnant. Had u/s today and they didn't see a pregnancy. No bleeding. Also having feet and hand swelling. Her quant did not rise today from the last one 3 weeks ago.

## 2017-12-18 ENCOUNTER — Ambulatory Visit: Payer: 59

## 2017-12-18 ENCOUNTER — Inpatient Hospital Stay (HOSPITAL_COMMUNITY)
Admission: AD | Admit: 2017-12-18 | Discharge: 2017-12-18 | Disposition: A | Discharge status: Home / Self Care | Payer: 59 | Source: Ambulatory Visit | Attending: Obstetrics and Gynecology | Admitting: Obstetrics and Gynecology

## 2017-12-18 ENCOUNTER — Inpatient Hospital Stay (HOSPITAL_COMMUNITY): Payer: 59

## 2017-12-18 DIAGNOSIS — N838 Other noninflammatory disorders of ovary, fallopian tube and broad ligament: Secondary | ICD-10-CM | POA: Insufficient documentation

## 2017-12-18 DIAGNOSIS — N801 Endometriosis of ovary: Secondary | ICD-10-CM | POA: Diagnosis not present

## 2017-12-18 DIAGNOSIS — Z3201 Encounter for pregnancy test, result positive: Secondary | ICD-10-CM | POA: Insufficient documentation

## 2017-12-18 DIAGNOSIS — O3481 Maternal care for other abnormalities of pelvic organs, first trimester: Secondary | ICD-10-CM | POA: Diagnosis not present

## 2017-12-18 DIAGNOSIS — Z3A01 Less than 8 weeks gestation of pregnancy: Secondary | ICD-10-CM | POA: Insufficient documentation

## 2017-12-18 DIAGNOSIS — O3680X0 Pregnancy with inconclusive fetal viability, not applicable or unspecified: Secondary | ICD-10-CM | POA: Diagnosis not present

## 2017-12-18 DIAGNOSIS — R109 Unspecified abdominal pain: Secondary | ICD-10-CM | POA: Diagnosis present

## 2017-12-18 DIAGNOSIS — O26891 Other specified pregnancy related conditions, first trimester: Secondary | ICD-10-CM | POA: Diagnosis not present

## 2017-12-18 LAB — HCG, QUANTITATIVE, PREGNANCY: hCG, Beta Chain, Quant, S: 914 m[IU]/mL — ABNORMAL HIGH (ref <5)

## 2017-12-18 NOTE — MAU Provider Note (Signed)
[name] is a 32 y.o. G1P0000 who present to MAU today from the clinic for a repeat ultrasound. She had an abnormal rise in hcg and is having abdominal pain that comes and goes.   She denies abdominal pain or vaginal bleeding.   BP 138/60   Pulse (!) 111   Temp 98.7 F (37.1 C)   Resp 18   Ht 5\' 6"  (1.676 m)   Wt 248 lb (112.5 kg)   LMP 10/22/2017   BMI 40.03 kg/m  CONSTITUTIONAL: Well-developed, well-nourished female in no acute distress.  CARDIOVASCULAR: Normal heart rate noted RESPIRATORY: Effort and breath sounds normal GASTROINTESTINAL:Soft, no distention noted.  No tenderness, rebound or guarding.  SKIN: Skin is warm and dry. No rash noted. Not diaphoretic. No erythema. No pallor. PSYCHIATRIC: Normal mood and affect. Normal behavior. Normal judgment and thought content.  MDM Medical screening exam complete US OB Transvaginal  Results for Frances Hill (MRN [id number]) as of 12/18/2017 15:01  Ref. Range 12/15/2017 18:30 12/15/2017 19:54 12/15/2017 20:53 12/18/2017 09:17 12/18/2017 14:01  HCG, Beta Chain, Quant, S Latest Ref Range: <5 mIU/mL  962 (H)  914 (H)    US Ob Transvaginal  Result Date: 12/18/2017 CLINICAL DATA:  Patient with abnormal rising beta HCG. EXAM: TRANSVAGINAL OB ULTRASOUND TECHNIQUE: Transvaginal ultrasound was performed for complete evaluation of the gestation as well as the maternal uterus, adnexal regions, and pelvic cul-de-sac. COMPARISON:  Pelvic ultrasound 12/15/2017 FINDINGS: Intrauterine gestational sac: Possible Yolk sac:  Not Visualized. Embryo:  Not Visualized. Cardiac Activity: Not Visualized. MSD: 4.4 mm   5 w   1 d Subchorionic hemorrhage:  None visualized. Maternal uterus/adnexae: The right ovary is enlarged and contains a 4.4 x 4.3 x 3.8 cm endometrioma. Questionable area identified within the right adnexa immediately adjacent to the right ovary (image 59/54) appears to move with the ovary with palpation and is nonspecific however may  represent a corpus luteum. Multiple uterine fibroids are demonstrated including a posterior pedunculated fibroid measuring 5.9 x 4.1 x 5.6 cm. There is a right uterine fibroid measuring 2.1 x 1.7 x 2.2 cm. IMPRESSION: 1. There is a small fluid collection within the endometrial canal which is nonspecific however may represent a small gestational sac versus pseudo gestational sac. No yolk sac identified. 2. The right ovary is enlarged and contains a large endometrioma. Immediately adjacent to the right ovary is a small cystic area which is indeterminate however appears to move with the ovary with palpation, favoring a corpus luteum. Recommend continued attention to this area on follow-up. 3. These results were called by telephone at the time of interpretation on 12/18/2017 at 2:23 pm toCAROLINE Frances Hill , who verbally acknowledged these results. Electronically Signed   By: Lovey Newcomer M.D.   On: 12/18/2017 14:26   Consulted with Dr. Harolyn Rutherford about results and patient's pain- Recommends repeat hcg in 48 hours due to inconclusive nature of ultrasound results and intermittent pain.   This is a very desired pregnancy and patient desires to wait to intervene in any way. Strict ectopic precautions reviewed with patient and patient agrees to come back if pain increases.  A:  1. Pregnancy of unknown anatomic location    P: -Discharge home -Patient to return to MAU on Wednesday for repeat bHCG and possible repeat u/s. -Strict ectopic precautions reviewed with patient -Patient may return to MAU as needed or if her condition were to change or worsen  Wende Mott, [place] 12/18/2017 3:00 PM

## 2017-12-18 NOTE — Progress Notes (Signed)
Pt here today for STAT beta.  Pt reports continued right sided stabbing pain.  Denies any bleeding at this time.  Pt informed to please wait in the waiting room for the duration of test.  Pt stated understanding that it can take up to 2 hours.   Notified Dr. Rosana Hoes of STAT beta results.  Provider recommends pt to go to MAU for [place] and possible discussion of methotrexate tx.  Pt notified of provider's recommendations.  MAU charge nurse notified. Pt stated understanding with no further questions.

## 2017-12-18 NOTE — Progress Notes (Signed)
I have reviewed this chart and agree with the RN/CMA assessment and management.    K. Meryl Davis, M.D. Attending Obstetrician & Gynecologist, [facility], [facility]  

## 2017-12-18 NOTE — MAU Note (Signed)
Pt sent from clinic for ectopic workup . BHCG not rising appropriately . HaVNG PAIN IN RIGHT SIDE.

## 2017-12-20 ENCOUNTER — Other Ambulatory Visit: Payer: 59

## 2017-12-20 DIAGNOSIS — Z3490 Encounter for supervision of normal pregnancy, unspecified, unspecified trimester: Secondary | ICD-10-CM | POA: Diagnosis not present

## 2017-12-20 DIAGNOSIS — O3680X0 Pregnancy with inconclusive fetal viability, not applicable or unspecified: Secondary | ICD-10-CM

## 2017-12-20 NOTE — Progress Notes (Unsigned)
Pt reports still having some mild lower abdominal discomfort - not the same as the RLQ pain she had 2 days ago. Pt will be notified of results via My Chart portal. Pt voiced understanding.

## 2017-12-21 ENCOUNTER — Other Ambulatory Visit: Payer: Self-pay

## 2017-12-21 ENCOUNTER — Encounter (HOSPITAL_COMMUNITY): Payer: Self-pay | Admitting: *Deleted

## 2017-12-21 ENCOUNTER — Inpatient Hospital Stay (HOSPITAL_COMMUNITY): Payer: 59

## 2017-12-21 ENCOUNTER — Telehealth: Payer: Self-pay

## 2017-12-21 ENCOUNTER — Inpatient Hospital Stay (HOSPITAL_COMMUNITY)
Admission: AD | Admit: 2017-12-21 | Discharge: 2017-12-21 | Disposition: A | Discharge status: Home / Self Care | Payer: 59 | Source: Ambulatory Visit | Attending: Obstetrics and Gynecology | Admitting: Obstetrics and Gynecology

## 2017-12-21 DIAGNOSIS — Z3A00 Weeks of gestation of pregnancy not specified: Secondary | ICD-10-CM | POA: Diagnosis not present

## 2017-12-21 DIAGNOSIS — R1030 Lower abdominal pain, unspecified: Secondary | ICD-10-CM | POA: Diagnosis not present

## 2017-12-21 DIAGNOSIS — O26891 Other specified pregnancy related conditions, first trimester: Secondary | ICD-10-CM | POA: Diagnosis present

## 2017-12-21 DIAGNOSIS — O3410 Maternal care for benign tumor of corpus uteri, unspecified trimester: Secondary | ICD-10-CM | POA: Diagnosis not present

## 2017-12-21 DIAGNOSIS — O00102 Left tubal pregnancy without intrauterine pregnancy: Secondary | ICD-10-CM | POA: Diagnosis not present

## 2017-12-21 LAB — URINALYSIS, MICROSCOPIC (REFLEX)

## 2017-12-21 LAB — COMPREHENSIVE METABOLIC PANEL
ALT: 14 U/L (ref 14–54)
AST: 18 U/L (ref 15–41)
Albumin: 3.9 g/dL (ref 3.5–5.0)
Alkaline Phosphatase: 50 U/L (ref 38–126)
Anion gap: 8 (ref 5–15)
BUN: 6 mg/dL (ref 6–20)
CO2: 20 mmol/L — ABNORMAL LOW (ref 22–32)
Calcium: 8.7 mg/dL — ABNORMAL LOW (ref 8.9–10.3)
Chloride: 105 mmol/L (ref 101–111)
Creatinine, Ser: 0.75 mg/dL (ref 0.44–1.00)
GFR calc Af Amer: >60 mL/min (ref >60)
GFR calc non Af Amer: >60 mL/min (ref >60)
Glucose, Bld: 89 mg/dL (ref 65–99)
Potassium: 3.4 mmol/L — ABNORMAL LOW (ref 3.5–5.1)
Sodium: 133 mmol/L — ABNORMAL LOW (ref 135–145)
Total Bilirubin: 0.6 mg/dL (ref 0.3–1.2)
Total Protein: 7.1 g/dL (ref 6.5–8.1)

## 2017-12-21 LAB — CBC
HCT: 37.6 % (ref 36.0–46.0)
Hemoglobin: 12.7 g/dL (ref 12.0–15.0)
MCH: 25.5 pg — ABNORMAL LOW (ref 26.0–34.0)
MCHC: 33.8 g/dL (ref 30.0–36.0)
MCV: 75.5 fL — ABNORMAL LOW (ref 78.0–100.0)
Platelets: 291 10*3/uL (ref 150–400)
RBC: 4.98 MIL/uL (ref 3.87–5.11)
RDW: 14 % (ref 11.5–15.5)
WBC: 7 10*3/uL (ref 4.0–10.5)

## 2017-12-21 LAB — URINALYSIS, ROUTINE W REFLEX MICROSCOPIC
Bilirubin Urine: NEGATIVE
Glucose, UA: NEGATIVE mg/dL
Ketones, ur: NEGATIVE mg/dL
Leukocytes, UA: NEGATIVE
Nitrite: NEGATIVE
Protein, ur: NEGATIVE mg/dL
Specific Gravity, Urine: 1.02 (ref 1.005–1.030)
pH: 6 (ref 5.0–8.0)

## 2017-12-21 LAB — HCG, QUANTITATIVE, PREGNANCY: hCG, Beta Chain, Quant, S: 420 m[IU]/mL — ABNORMAL HIGH (ref <5)

## 2017-12-21 LAB — BETA HCG QUANT (REF LAB): hCG Quant: 713 m[IU]/mL

## 2017-12-21 MED ORDER — HYDROMORPHONE HCL 1 MG/ML IJ SOLN
1.0000 mg | Freq: Once | INTRAMUSCULAR | Status: AC
  Administered 2017-12-21: 1 mg via INTRAMUSCULAR
  Filled 2017-12-21: qty 1

## 2017-12-21 MED ORDER — METHOTREXATE INJECTION FOR WOMEN'S HOSPITAL
50.0000 mg/m2 | Freq: Once | INTRAMUSCULAR | Status: AC
  Administered 2017-12-21: 115 mg via INTRAMUSCULAR
  Filled 2017-12-21: qty 2.3

## 2017-12-21 MED ORDER — OXYCODONE-ACETAMINOPHEN 5-325 MG PO TABS: 1.0000 | ORAL_TABLET | Freq: Four times a day (QID) | ORAL | 0 refills | Status: DC | PRN

## 2017-12-21 NOTE — Discharge Instructions (Signed)
Methotrexate Treatment for an Ectopic Pregnancy Methotrexate is a medicine that treats a pregnancy condition in which the fetus develops outside the uterus (ectopic pregnancy) by stopping the growth of the fertilized egg. It also helps your body absorb tissue from the egg. This takes between 2 weeks and 6 weeks. Most ectopic pregnancies can be successfully treated with methotrexate if they are detected early enough. Tell a health care provider about:  Any allergies you have.  All medicines you are taking, including vitamins, herbs, eye drops, creams, and over-the-counter medicines.  Any medical conditions you have. What are the risks? Generally, this is a safe treatment. However, problems can occur, including:  Nausea.  Vomiting.  Diarrhea.  Abdominal cramping.  Mouth sores.  Increased vaginal bleeding or spotting.  Swelling or irritation of the lining of your lungs (pneumonitis).  Failed treatment and continuation of the pregnancy.  Liver damage.  Hair loss.  There is still a risk of the ectopic pregnancy rupturing while using the methotrexate. What happens before the procedure? Before you take the medicine:  Liver tests, kidney tests, and a complete blood test are performed.  Blood tests are performed to measure the pregnancy hormone levels and to determine your blood type.  If you are Rh-negative and the father is Rh-positive or his Rh type is not known, you will be given a Rho (D) immune globulin shot.  What happens during the procedure? There are two methods that your health care provider may use to give you methotrexate.  One method involves a single dose or injection of the medicine.  Another method involves a series of doses given through several injections.  What happens after the procedure?  You may have some abdominal cramping, vaginal bleeding, and fatigue in the first few days after taking methotrexate.  Blood tests will be taken for several weeks to  check the pregnancy hormone levels. The blood tests are performed until there is no more pregnancy hormone detected in the blood. This information is not intended to replace advice given to you by your health care provider. Make sure you discuss any questions you have with your health care provider. Document Released: 09/13/2001 Document Revised: 02/25/2016 Document Reviewed: 07/08/2013 Elsevier Interactive Patient Education  2017 [employer].  Methotrexate Treatment for an Ectopic Pregnancy, Care After Refer to this sheet in the next few weeks. These instructions provide you with information on caring for yourself after your procedure. Your health care provider may also give you more specific instructions. Your treatment has been planned according to current medical practices, but problems sometimes occur. Call your health care provider if you have any problems or questions after your procedure. What can I expect after the procedure? You may have some abdominal cramping, vaginal bleeding, and fatigue in the first few days after taking methotrexate. Some other possible side effects of methotrexate include:  Nausea.  Vomiting.  Diarrhea.  Mouth sores.  Swelling or irritation of the lining of your lungs (pneumonitis).  Liver damage.  Hair loss.  Follow these instructions at home: After you have received the methotrexate medicine, you need to be careful of your activities and watch your condition for several weeks. It may take 1 week before your hormone levels return to normal. Activity  Do not have sexual intercourse until your health care provider says it is safe to do so.  You may resume your usual diet.  Limit strenuous activity.  Do not drink alcohol. General instructions  Do not take aspirin, ibuprofen, or naproxen (  nonsteroidal anti-inflammatory drugs [NSAIDs]).  Do not take folic acid, prenatal vitamins, or other vitamins that contain folic acid.  Avoid traveling too  far away from your health care provider.  Keep all follow-up visits as told by your health care provider. This is important. Contact a health care provider if:  You cannot control your nausea and vomiting.  You cannot control your diarrhea.  You have sores in your mouth and want treatment.  You need pain medicine for your abdominal pain.  You have a rash.  You are having a reaction to the medicine. Get help right away if:  You have increasing abdominal or pelvic pain.  You notice increased bleeding.  You feel light-headed, or you faint.  You have shortness of breath.  Your heart rate increases.  You have a cough.  You have chills.  You have a fever. This information is not intended to replace advice given to you by your health care provider. Make sure you discuss any questions you have with your health care provider. Document Released: 09/08/2011 Document Revised: 02/25/2016 Document Reviewed: 07/08/2013 Elsevier Interactive Patient Education  2017 [employer].   Ectopic Pregnancy An ectopic pregnancy happens when a fertilized egg grows outside the uterus. A pregnancy cannot live outside of the uterus. This problem often happens in the fallopian tube. It is often caused by damage to the fallopian tube. If this problem is found early, you may be treated with medicine. If your tube tears or bursts open (ruptures), you will bleed inside. This is an emergency. You will need surgery. Get help right away. What are the signs or symptoms? You may have normal pregnancy symptoms at first. These include:  Missing your period.  Feeling sick to your stomach (nauseous).  Being tired.  Having tender breasts.  Then, you may start to have symptoms that are not normal. These include:  Pain with sex (intercourse).  Bleeding from the vagina. This includes light bleeding (spotting).  Belly (abdomen) or lower belly cramping or pain. This may be felt on one side.  A fast  heartbeat (pulse).  Passing out (fainting) after going poop (bowel movement).  If your tube tears, you may have symptoms such as:  Really bad pain in the belly or lower belly. This happens suddenly.  Dizziness.  Passing out.  Shoulder pain.  Get help right away if: You have any of these symptoms. This is an emergency. This information is not intended to replace advice given to you by your health care provider. Make sure you discuss any questions you have with your health care provider. Document Released: 12/16/2008 Document Revised: 02/25/2016 Document Reviewed: 05/01/2013 Elsevier Interactive Patient Education  2017 [employer].

## 2017-12-21 NOTE — MAU Note (Signed)
Pt presents with c/o constant lower abdominal cramping & spotting since last night.  Pt states spotting is fluctuating from bright red to dark brown in color.  Took Tylenol, no relief noted

## 2017-12-21 NOTE — Telephone Encounter (Signed)
Left message for patient to return call to clinic to schedule follow up HCG. Kathrene Alu RNBSN

## 2017-12-21 NOTE — Telephone Encounter (Signed)
-----   Message from Woodroe Mode, MD sent at 12/21/2017  9:22 AM EDT ----- HCG dropping, repeat in one week

## 2017-12-21 NOTE — MAU Provider Note (Signed)
History     CSN: [id number]  Arrival date and time: 12/21/17 1005   First Provider Initiated Contact with Patient 12/21/17 1058     Chief Complaint  Patient presents with  . Abdominal Pain  . Vaginal Bleeding   HPI  Frances Hill is a 32 y.o. G1P0000 at [redacted]w[redacted]d who presents with a sudden onset of lower abdominal pain. She states it started yesterday afternoon and has gradually gotten worse. She states she is also seeing spotting when she wipes. She has been being followed in the clinic due to abnormal changes in her HCG. This is a very desired pregnancy and patient did not want intervene with methotrexate until she knew for sure if it was an ectopic pregnancy or a miscarriage.   OB History    Gravida  1   Para  0   Term  0   Preterm  0   AB  0   Living  0     SAB  0   TAB  0   Ectopic  0   Multiple  0   Live Births              Past Medical History:  Diagnosis Date  . Anxiety   . Cyst of right ovary   . Endometriosis   . Fibroids   . History of ovarian cyst    12-25-2013  s/p removal left ovarian endometrioma  . History of pelvic inflammatory disease   . History of trichomoniasis   . Sickle cell trait [facility])     Past Surgical History:  Procedure Laterality Date  . CHROMOPERTUBATION  04/24/2015   Procedure: CHROMOPERTUBATION;  Surgeon: Governor Specking, MD;  Location: [facility];  Service: Gynecology;;  . LAPAROSCOPIC LYSIS OF ADHESIONS  04/24/2015   Procedure: LAPAROSCOPIC LYSIS OF ADHESIONS AND EXCISION ENDOMETRIOTIC IMPLANTS;  Surgeon: Governor Specking, MD;  Location: [place];  Service: Gynecology;;  . LAPAROSCOPIC OVARIAN CYSTECTOMY Right 04/24/2015   Procedure: LAPAROSCOPIC, RIGHT OVARIAN CYSTECTOMY,  GEL PORT ASSISTED MYOMECTOMY;  Surgeon: Governor Specking, MD;  Location: [place];  Service: Gynecology;  Laterality: Right;  . LAPAROSCOPY N/A 12/25/2013   Procedure: OPERATIVE LAPAROSCOPY;  Surgeon:  Osborne Oman, MD;  Location: [facility];  Service: Gynecology;  Laterality: N/A;  . SALPINGOOPHORECTOMY Left 12/25/2013   Procedure: SALPINGO OOPHORECTOMY;  Surgeon: Osborne Oman, MD;  Location: [facility];  Service: Gynecology;  Laterality: Left;    Family History  Problem Relation Age of Onset  . Aneurysm Mother 27       died of aneurysm, brain  . Cancer Mother 63       breast, twice  . Hypertension Father   . Depression Father   . Heart disease Father        valve disease  . Other Father        DDD  . Other Brother        DDD  . Sarcoidosis Brother   . Cancer Maternal Aunt        breast cancer  . Diabetes Neg Hx   . Stroke Neg Hx     Social History   Tobacco Use  . Smoking status: Never Smoker  . Smokeless tobacco: Never Used  Substance Use Topics  . Alcohol use: Yes    Comment: occasionally/socially  . Drug use: No    Allergies: No Known Allergies  Medications Prior to Admission  Medication Sig Dispense Refill Last Dose  .  acetaminophen (TYLENOL) 325 MG tablet Take 650 mg by mouth every 6 (six) hours as needed for mild pain or headache.   12/21/2017 at Unknown time  . Prenatal Vit-Fe Fumarate-FA (PRENATAL MULTIVITAMIN) TABS tablet Take 1 tablet by mouth daily at 12 noon.   12/20/2017 at Unknown time    Review of Systems  Constitutional: Negative.  Negative for fatigue and fever.  HENT: Negative.   Respiratory: Negative.  Negative for shortness of breath.   Cardiovascular: Negative.  Negative for chest pain.  Gastrointestinal: Positive for abdominal pain. Negative for constipation, diarrhea, nausea and vomiting.  Genitourinary: Positive for vaginal bleeding. Negative for dysuria.  Neurological: Negative.  Negative for dizziness and headaches.   Physical Exam   Blood pressure 125/66, pulse 78, temperature 98.6 F (37 C), temperature source Oral, resp. rate 18, height 5\' 6"  (1.676 m), weight 249 lb (112.9 kg), last menstrual period 10/22/2017, SpO2 98  %.  Physical Exam  Nursing note and vitals reviewed. Constitutional: She is oriented to person, place, and time. She appears well-developed and well-nourished. No distress.  HENT:  Head: Normocephalic.  Eyes: Pupils are equal, round, and reactive to light.  Cardiovascular: Normal rate, regular rhythm and normal heart sounds.  Respiratory: Effort normal and breath sounds normal. No respiratory distress.  GI: Soft. Bowel sounds are normal. She exhibits no distension. There is tenderness. There is no rebound and no guarding.  Neurological: She is alert and oriented to person, place, and time.  Skin: Skin is warm and dry.  Psychiatric: She has a normal mood and affect. Her behavior is normal. Judgment and thought content normal.    MAU Course  Procedures Results for orders placed or performed during the hospital encounter of 12/21/17 (from the past 24 hour(s))  Urinalysis, Routine w reflex microscopic     Status: Abnormal   Collection Time: 12/21/17 10:30 AM  Result Value Ref Range   Color, Urine YELLOW YELLOW   APPearance CLEAR CLEAR   Specific Gravity, Urine 1.020 1.005 - 1.030   pH 6.0 5.0 - 8.0   Glucose, UA NEGATIVE NEGATIVE mg/dL   Hgb urine dipstick MODERATE (A) NEGATIVE   Bilirubin Urine NEGATIVE NEGATIVE   Ketones, ur NEGATIVE NEGATIVE mg/dL   Protein, ur NEGATIVE NEGATIVE mg/dL   Nitrite NEGATIVE NEGATIVE   Leukocytes, UA NEGATIVE NEGATIVE  Urinalysis, Microscopic (reflex)     Status: Abnormal   Collection Time: 12/21/17 10:30 AM  Result Value Ref Range   RBC / HPF 0-5 0 - 5 RBC/hpf   WBC, UA 0-5 0 - 5 WBC/hpf   Bacteria, UA RARE (A) NONE SEEN   Squamous Epithelial / LPF 0-5 (A) NONE SEEN   US Ob Transvaginal  Result Date: 12/21/2017 CLINICAL DATA:  Pregnant, abdominal pain, spotting EXAM: TRANSVAGINAL OB ULTRASOUND TECHNIQUE: Transvaginal ultrasound was performed for complete evaluation of the gestation as well as the maternal uterus, adnexal regions, and pelvic  cul-de-sac. COMPARISON:  None. FINDINGS: Intrauterine gestational sac: None Maternal uterus/adnexae: No IUP is visualized. Endometrial complex measures up to 17 mm. Enlarged uterus with multiple uterine fibroids, including a dominant 5.8 x 5.2 x 4.3 cm pedunculated fibroid along the posterior uterine body. Patient is status post left salpingo-oophorectomy. 1.9 x 1.3 x 1.5 cm ring-like lesion in the left adnexa (image 38), not previously visualized. In the setting of a positive pregnancy test, this appearance is highly suspicious for ectopic pregnancy. Right ovary is notable for a 4.5 x 4.2 x 3.9 cm complex cystic mass, compatible with  endometrioma. No free fluid. IMPRESSION: No IUP is visualized. Status post left salpingo-oophorectomy. 1.9 x 1.3 x 1.5 cm ring-like lesion in the left adnexa, highly suspicious for ectopic pregnancy in the setting of positive pregnancy test. No free fluid. Uterine fibroids, as described above. 4.5 cm left ovarian endometrioma, as described above. Critical Value/emergent results were called by telephone at the time of interpretation on 12/21/2017 at 12:44 pm to Fatima Blank, who verbally acknowledged these results. Electronically Signed   By: Julian Hy M.D.   On: 12/21/2017 12:49   MDM US OB Transvaginal  Dilaudid 1mg  IM Consulted with Dr. Elly Modena regarding ultrasound results- will offer patient methotrexate for ectopic pregnancy. Patient counseled on risk and benefits of methotrexate and patient agreeable to plan of care.   CBC, CMP Methotrexate  Assessment and Plan   1. Left tubal pregnancy without intrauterine pregnancy    -Discharge home in stable condition -Rx for limited number of percocet -Strict ectopic return precautions discussed -Patient advised to follow-up with MAU on Sunday for day 4 labs -Patient may return to MAU as needed or if her condition were to change or worsen   Wende Mott CNM 12/21/2017, 10:59 AM

## 2017-12-22 ENCOUNTER — Inpatient Hospital Stay (HOSPITAL_COMMUNITY)
Admission: EM | Admit: 2017-12-22 | Discharge: 2017-12-22 | Disposition: A | Discharge status: Other Acute Inpatient Hospital | Payer: 59 | Attending: Obstetrics & Gynecology | Admitting: Obstetrics & Gynecology

## 2017-12-22 ENCOUNTER — Emergency Department (HOSPITAL_COMMUNITY): Payer: 59

## 2017-12-22 ENCOUNTER — Other Ambulatory Visit: Payer: Self-pay

## 2017-12-22 ENCOUNTER — Encounter (HOSPITAL_COMMUNITY): Payer: Self-pay | Admitting: Emergency Medicine

## 2017-12-22 DIAGNOSIS — O3410 Maternal care for benign tumor of corpus uteri, unspecified trimester: Secondary | ICD-10-CM | POA: Diagnosis not present

## 2017-12-22 DIAGNOSIS — Z3A00 Weeks of gestation of pregnancy not specified: Secondary | ICD-10-CM | POA: Diagnosis not present

## 2017-12-22 DIAGNOSIS — R109 Unspecified abdominal pain: Secondary | ICD-10-CM | POA: Diagnosis not present

## 2017-12-22 DIAGNOSIS — D573 Sickle-cell trait: Secondary | ICD-10-CM | POA: Insufficient documentation

## 2017-12-22 DIAGNOSIS — R1032 Left lower quadrant pain: Secondary | ICD-10-CM | POA: Diagnosis present

## 2017-12-22 DIAGNOSIS — O00102 Left tubal pregnancy without intrauterine pregnancy: Secondary | ICD-10-CM

## 2017-12-22 DIAGNOSIS — R102 Pelvic and perineal pain: Secondary | ICD-10-CM

## 2017-12-22 LAB — I-STAT BETA HCG BLOOD, ED (MC, WL, AP ONLY): I-stat hCG, quantitative: 287.2 m[IU]/mL — ABNORMAL HIGH (ref <5)

## 2017-12-22 LAB — CBC WITH DIFFERENTIAL/PLATELET
Basophils Absolute: 0 10*3/uL (ref 0.0–0.1)
Basophils Relative: 0 %
Eosinophils Absolute: 0.1 10*3/uL (ref 0.0–0.7)
Eosinophils Relative: 1 %
HCT: 37.2 % (ref 36.0–46.0)
Hemoglobin: 12.5 g/dL (ref 12.0–15.0)
Lymphocytes Relative: 28 %
Lymphs Abs: 2 10*3/uL (ref 0.7–4.0)
MCH: 25.9 pg — ABNORMAL LOW (ref 26.0–34.0)
MCHC: 33.6 g/dL (ref 30.0–36.0)
MCV: 77.2 fL — ABNORMAL LOW (ref 78.0–100.0)
Monocytes Absolute: 0.7 10*3/uL (ref 0.1–1.0)
Monocytes Relative: 10 %
Neutro Abs: 4.4 10*3/uL (ref 1.7–7.7)
Neutrophils Relative %: 61 %
Platelets: 274 10*3/uL (ref 150–400)
RBC: 4.82 MIL/uL (ref 3.87–5.11)
RDW: 13.8 % (ref 11.5–15.5)
WBC: 7.2 10*3/uL (ref 4.0–10.5)

## 2017-12-22 LAB — BASIC METABOLIC PANEL
Anion gap: 9 (ref 5–15)
BUN: <5 mg/dL — ABNORMAL LOW (ref 6–20)
CO2: 23 mmol/L (ref 22–32)
Calcium: 8.9 mg/dL (ref 8.9–10.3)
Chloride: 105 mmol/L (ref 101–111)
Creatinine, Ser: 0.85 mg/dL (ref 0.44–1.00)
GFR calc Af Amer: >60 mL/min (ref >60)
GFR calc non Af Amer: >60 mL/min (ref >60)
Glucose, Bld: 109 mg/dL — ABNORMAL HIGH (ref 65–99)
Potassium: 3.5 mmol/L (ref 3.5–5.1)
Sodium: 137 mmol/L (ref 135–145)

## 2017-12-22 LAB — HCG, QUANTITATIVE, PREGNANCY: hCG, Beta Chain, Quant, S: 299 m[IU]/mL — ABNORMAL HIGH (ref <5)

## 2017-12-22 MED ORDER — HYDROMORPHONE HCL 1 MG/ML IJ SOLN
1.0000 mg | Freq: Once | INTRAMUSCULAR | Status: AC
  Administered 2017-12-22: 1 mg via INTRAVENOUS
  Filled 2017-12-22: qty 1

## 2017-12-22 MED ORDER — SODIUM CHLORIDE 0.9 % IV BOLUS (SEPSIS)
1000.0000 mL | Freq: Once | INTRAVENOUS | Status: AC
  Administered 2017-12-22: 1000 mL via INTRAVENOUS

## 2017-12-22 MED ORDER — KETOROLAC TROMETHAMINE 30 MG/ML IJ SOLN
30.0000 mg | Freq: Once | INTRAMUSCULAR | Status: AC
  Administered 2017-12-22: 30 mg via INTRAVENOUS
  Filled 2017-12-22: qty 1

## 2017-12-22 MED ORDER — KETOROLAC TROMETHAMINE 30 MG/ML IJ SOLN: 30.0000 mg | Freq: Once | INTRAMUSCULAR | Status: DC

## 2017-12-22 NOTE — ED Provider Notes (Signed)
[place] DEPT Provider Note   CSN: [id number] Arrival date & time: 12/22/17  0241     History   Chief Complaint Chief Complaint  Patient presents with  . Abdominal Pain    HPI Frances Hill is a 32 y.o. female.   32 year old female with a history of sickle cell trait, endometriosis, pelvic inflammatory disease presents to the emergency department for evaluation of abdominal pain.  She has been experiencing pain in her left lower quadrant over the past few days.  Her pain has worsened in the last 24 hours.  She has been taking Percocet for symptoms without relief.  She notes that her pain is secondary to a left tubal pregnancy.  She was treated with methotrexate at [facility] yesterday.  She has noted some vaginal bleeding, but has not required more than one pad in a 24-hour period.  She denies any vomiting or bowel changes.  No associated fevers.  Surgical history is significant for laparoscopic right ovarian cystectomy, left salpingo-oophorectomy and laparoscopic lysis of adhesions.  OB Kerin Perna  The history is provided by the patient. No language interpreter was used.  Abdominal Pain      Past Medical History:  Diagnosis Date  . Anxiety   . Cyst of right ovary   . Endometriosis   . Fibroids   . History of ovarian cyst    12-25-2013  s/p removal left ovarian endometrioma  . History of pelvic inflammatory disease   . History of trichomoniasis   . Sickle cell trait [facility])     Patient Active Problem List   Diagnosis Date Noted  . Routine general medical examination at a health care facility 10/27/2016  . Vaginal discharge 10/27/2016  . Itching 10/27/2016  . Screen for STD (sexually transmitted disease) 10/27/2016  . Obesity with serious comorbidity 10/27/2016  . Influenza vaccination declined 10/27/2016  . Encounter for surveillance of contraceptive pills 10/27/2016  . Depressed mood 10/27/2016  . PTSD (post-traumatic  stress disorder) 10/27/2016  . Screening for cervical cancer 10/27/2016  . Bladder infection 09/30/2015  . Frequent headaches 09/30/2015  . Generalized anxiety disorder 09/30/2015  . Insomnia 09/30/2015  . Chronic pelvic pain in female 09/30/2015  . Female fertility problem 09/30/2015  . Endometriosis of ovary 04/24/2015  . Dyspareunia 04/22/2015  . Venereal disease contact 04/22/2015  . Missed period 04/22/2015  . Cyst of right ovary 04/22/2015  . Endometriosis 01/14/2014  . Ovarian cyst 12/11/2013    Past Surgical History:  Procedure Laterality Date  . CHROMOPERTUBATION  04/24/2015   Procedure: CHROMOPERTUBATION;  Surgeon: Governor Specking, MD;  Location: [facility];  Service: Gynecology;;  . LAPAROSCOPIC LYSIS OF ADHESIONS  04/24/2015   Procedure: LAPAROSCOPIC LYSIS OF ADHESIONS AND EXCISION ENDOMETRIOTIC IMPLANTS;  Surgeon: Governor Specking, MD;  Location: [place];  Service: Gynecology;;  . LAPAROSCOPIC OVARIAN CYSTECTOMY Right 04/24/2015   Procedure: LAPAROSCOPIC, RIGHT OVARIAN CYSTECTOMY,  GEL PORT ASSISTED MYOMECTOMY;  Surgeon: Governor Specking, MD;  Location: [place];  Service: Gynecology;  Laterality: Right;  . LAPAROSCOPY N/A 12/25/2013   Procedure: OPERATIVE LAPAROSCOPY;  Surgeon: Osborne Oman, MD;  Location: [facility];  Service: Gynecology;  Laterality: N/A;  . SALPINGOOPHORECTOMY Left 12/25/2013   Procedure: SALPINGO OOPHORECTOMY;  Surgeon: Osborne Oman, MD;  Location: [facility];  Service: Gynecology;  Laterality: Left;    OB History    Gravida  1   Para  0   Term  0  Preterm  0   AB  0   Living  0     SAB  0   TAB  0   Ectopic  0   Multiple  0   Live Births               Home Medications    Prior to Admission medications   Medication Sig Start Date End Date Taking? Authorizing Provider  acetaminophen (TYLENOL) 325 MG tablet Take 650 mg by mouth every 6 (six) hours as needed for mild pain  or headache.   Yes [provider]  oxyCODONE-acetaminophen (PERCOCET/ROXICET) 5-325 MG tablet Take 1 tablet by mouth every 6 (six) hours as needed for severe pain. 12/21/17  Yes Wende Mott, CNM  Prenatal Vit-Fe Fumarate-FA (PRENATAL MULTIVITAMIN) TABS tablet Take 1 tablet by mouth daily at 12 noon.   Yes [provider]    Family History Family History  Problem Relation Age of Onset  . Aneurysm Mother 84       died of aneurysm, brain  . Cancer Mother 83       breast, twice  . Hypertension Father   . Depression Father   . Heart disease Father        valve disease  . Other Father        DDD  . Other Brother        DDD  . Sarcoidosis Brother   . Cancer Maternal Aunt        breast cancer  . Diabetes Neg Hx   . Stroke Neg Hx     Social History Social History   Tobacco Use  . Smoking status: Never Smoker  . Smokeless tobacco: Never Used  Substance Use Topics  . Alcohol use: Yes    Comment: occasionally/socially  . Drug use: No     Allergies   Patient has no known allergies.   Review of Systems Review of Systems  Gastrointestinal: Positive for abdominal pain.  Ten systems reviewed and are negative for acute change, except as noted in the HPI.    Physical Exam Updated Vital Signs BP 134/73 (BP Location: Right Arm)   Pulse 73   Temp 98.4 F (36.9 C) (Oral)   Resp (!) 1   LMP 10/22/2017   SpO2 100%   Physical Exam  Constitutional: She is oriented to person, place, and time. She appears well-developed and well-nourished. No distress.  Nontoxic appearing and in no acute distress  HENT:  Head: Normocephalic and atraumatic.  Eyes: Conjunctivae and EOM are normal. No scleral icterus.  Neck: Normal range of motion.  Cardiovascular: Normal rate, regular rhythm and intact distal pulses.  Pulmonary/Chest: Effort normal. No respiratory distress.  Respirations even and unlabored  Abdominal: Soft. She exhibits no distension and no mass. There  is tenderness.  Soft, obese abdomen with tenderness to palpation in the left lower quadrant.  Mild voluntary guarding.  No peritoneal signs or palpable masses.  Musculoskeletal: Normal range of motion.  Neurological: She is alert and oriented to person, place, and time. She exhibits normal muscle tone. Coordination normal.  Skin: Skin is warm and dry. No rash noted. She is not diaphoretic. No erythema. No pallor.  Psychiatric: She has a normal mood and affect. Her behavior is normal.  Nursing note and vitals reviewed.    ED Treatments / Results  Labs (all labs ordered are listed, but only abnormal results are displayed) Labs Reviewed  HCG, QUANTITATIVE, PREGNANCY - Abnormal; Notable for the  following components:      Result Value   hCG, Beta Chain, Quant, S 299 (*)    All other components within normal limits  CBC WITH DIFFERENTIAL/PLATELET - Abnormal; Notable for the following components:   MCV 77.2 (*)    MCH 25.9 (*)    All other components within normal limits  BASIC METABOLIC PANEL - Abnormal; Notable for the following components:   Glucose, Bld 109 (*)    BUN <5 (*)    All other components within normal limits  I-STAT BETA HCG BLOOD, ED ([facility], [facility], [facility] ONLY) - Abnormal; Notable for the following components:   I-stat hCG, quantitative 287.2 (*)    All other components within normal limits    EKG  EKG Interpretation None       Radiology US Ob Transvaginal  Result Date: 12/22/2017 CLINICAL DATA:  Initial evaluation for persistent pelvic pain, history of recently identified ectopic pregnancy in the left adnexa, status post methotrexate. Beta HCG is decreasing. EXAM: OBSTETRIC <14 WK [place] AND TRANSVAGINAL OB US TECHNIQUE: Both transabdominal and transvaginal ultrasound examinations were performed for complete evaluation of the gestation as well as the maternal uterus, adnexal regions, and pelvic cul-de-sac. Transvaginal technique was performed to assess early pregnancy.  COMPARISON:  Prior ultrasound from 12/21/2017 FINDINGS: Intrauterine gestational sac: Not visualized. Maternal uterus/adnexae: No IUP identified within the uterus. Endometrial complex relatively similar measuring approximately 17 mm. Uterus is enlarged with multiple fibroids, stable from previous. Patient is status post left self injury oophorectomy. Previously identified ring-like lesion within the left adnexa again seen, measuring 2.2 x 1.7 x 1.9 cm on today's study (previously 1.9 x 1.3 x 1.5 cm). Again, finding highly suspicious for ectopic pregnancy. Overall, this is similar to perhaps slightly less prominent as compared to previous exam. Complex cystic mass within the right ovary again seen, measuring 3.9 x 3.7 x 3.2 cm. Homogeneous low-level internal echoes most consistent with endometrioma. No free fluid. IMPRESSION: 1. Persistent ring-like lesion within the left adnexa, concerning for ectopic pregnancy, measuring 2.2 x 1.7 x 1.9 cm on today's exam, relatively similar as compared to ultrasound from 1 day previous. No free fluid. 2. 3.9 cm right ovarian endometrioma. 3. Fibroid uterus. Critical Value/emergent results were called by telephone at the time of interpretation on 12/22/2017 at 6:45 am to [place] , who verbally acknowledged these results. Electronically Signed   By: Jeannine Boga M.D.   On: 12/22/2017 06:48   US Ob Transvaginal  Result Date: 12/21/2017 CLINICAL DATA:  Pregnant, abdominal pain, spotting EXAM: TRANSVAGINAL OB ULTRASOUND TECHNIQUE: Transvaginal ultrasound was performed for complete evaluation of the gestation as well as the maternal uterus, adnexal regions, and pelvic cul-de-sac. COMPARISON:  None. FINDINGS: Intrauterine gestational sac: None Maternal uterus/adnexae: No IUP is visualized. Endometrial complex measures up to 17 mm. Enlarged uterus with multiple uterine fibroids, including a dominant 5.8 x 5.2 x 4.3 cm pedunculated fibroid along the posterior uterine  body. Patient is status post left salpingo-oophorectomy. 1.9 x 1.3 x 1.5 cm ring-like lesion in the left adnexa (image 38), not previously visualized. In the setting of a positive pregnancy test, this appearance is highly suspicious for ectopic pregnancy. Right ovary is notable for a 4.5 x 4.2 x 3.9 cm complex cystic mass, compatible with endometrioma. No free fluid. IMPRESSION: No IUP is visualized. Status post left salpingo-oophorectomy. 1.9 x 1.3 x 1.5 cm ring-like lesion in the left adnexa, highly suspicious for ectopic pregnancy in the setting of positive pregnancy  test. No free fluid. Uterine fibroids, as described above. 4.5 cm left ovarian endometrioma, as described above. Critical Value/emergent results were called by telephone at the time of interpretation on 12/21/2017 at 12:44 pm to Fatima Blank, who verbally acknowledged these results. Electronically Signed   By: Julian Hy M.D.   On: 12/21/2017 12:49    Procedures Procedures (including critical care time)  Medications Ordered in ED Medications  HYDROmorphone (DILAUDID) injection 1 mg (has no administration in time range)  sodium chloride 0.9 % bolus 1,000 mL (1,000 mLs Intravenous New Bag/Given 12/22/17 0458)  HYDROmorphone (DILAUDID) injection 1 mg (1 mg Intravenous Given 12/22/17 0458)  ketorolac (TORADOL) 30 MG/ML injection 30 mg (30 mg Intravenous Given 12/22/17 0458)     Initial Impression / Assessment and Plan / ED Course  I have reviewed the triage vital signs and the nursing notes.  Pertinent labs & imaging results that were available during my care of the patient were reviewed by me and considered in my medical decision making (see chart for details).     4:45 AM Patient presents for persistent left lower quadrant pain in the setting of recently diagnosed left ectopic pregnancy.  She was treated with methotrexate and has been taking Percocet without improvement to her discomfort.  She denies associated fevers.   Will provide repeat imaging for reassessment.  6:45 AM Radiology has called to report that the patient's ultrasound today shows stable evidence of ectopic pregnancy on the left; possibly slightly less prominent subjectively.  No increased fluid or concern for ruptured ectopic based on ultrasound findings.  6:51 AM Case discussed with Dr. Rosana Hoes at [facility]. Se recommends transfer to [facility] to MAU for continued observation.   Final Clinical Impressions(s) / ED Diagnoses   Final diagnoses:  Left tubal pregnancy without intrauterine pregnancy    ED Discharge Orders    None       Antonietta Breach, PA-C 12/22/17 0655    Shanon Rosser, MD 12/22/17 [phone]

## 2017-12-22 NOTE — ED Triage Notes (Signed)
Pt arriving POV with complaint of LLQ abdominal pain. Pt was seen at [facility] yesterday afternoon and was told she has a left tubal pregnancy. Pt was given prescription for Percocet. Pt states she took a Percocet approx. 3 hours ago and is still rating the pain 10/10.

## 2017-12-22 NOTE — Progress Notes (Signed)
Pt seen leaving MAU through back door.  RN approached pt inquiring about her leaving prior to discharge.  Pt states she's ok & is ready to leave.  Pt instructed RN needed to remove saline lock.  Pt states she already did it and proceeded to leave unit.

## 2017-12-22 NOTE — ED Notes (Signed)
Called MAU spoke w laren feilds Pt to be sent to MAU room 10  Called carelink request made  Paper work complete

## 2017-12-22 NOTE — MAU Note (Signed)
Pt sent to MAU from [facility] for continued evaluation left sided lower abdominal pain.  Pt reports pain is constant, sharp & stabbing.  Pt reports given IV pain meds, some relief noted. Pt seen yesterday in MAU treated with Methotrexate for ectopic pregnancy.  Given Rx for Percocet yesterday, states not helping with pain management.

## 2017-12-22 NOTE — Progress Notes (Signed)
I offered spiritual and emotional support to pt over 2 visits this morning.  At the first visit, she stated that she was feeling overwhelmed.  She rated her pain at a 5-6 and stated that she was overwhelmed physically and emotionally about the loss of the pregnancy and the unexpectedly severe pain she was experiencing.  I asked her if she would be interested in some mindfulness meditation and she was receptive.  I provided a guided meditation over about 15 minutes.  She became visibly less tense.  Following the meditation, she rated her physical pain as still 5-6, but she stated that she was somewhat less overwhelmed.  She wanted to rest and was receptive to me coming back to check on her later.    When I returned about 45 minutes later, I brought her a prayer shawl.  She opened up about the emotional pain of losing her first child when she assumed that because of her significant endometriosis and past surgical history related to that, that she would not be able to get pregnant.  She had still hoped for a baby, but was surprised when she found out she was pregnant.  We talked about her strong attachment to her baby and to her baby's future and the grief she is feeling about the loss.  She has strong support from family, but at times their support can be overwhelming and not always helpful.  We talked about ways to manage this.  I brought her resources for ongoing support, including heartstrings and our comfort program here.    She stated that her pain was not easing up and I alerted her nurse practitioner, Earlie Server, who came in to talk with her about her pain level as well as the results of the ultrasound. I witnessed the interaction, Karna Christmas was very appropriate in her explanations and listened compassionately.  The pt stated that although she is calm in her behavior and therefore does not look like she is in pain, she is experiencing some of the worst pain in her life (despite having had surgeries in the  past).  She is also concerned about her headache and dizziness and is concerned about falling at home.  We inquired about support at home to see if someone could stay with her while she is going through this.  Terri addressed each of her questions and stated that she would order something for pain before pt was to be discharged.  The pt, nonetheless became upset and asked her to leave.  Pt stated to me that she feels that she has to work hard to advocate for herself here and she has a long history of not feeling heard at this hospital related to the pain from her endometriosis.  She stated that she will not come back to [facility] again, not even for her follow up appointment on Sunday.  I encouraged her to return on Sunday so that she could make sure that the pregnancy is resolving okay.  She stated that she would not.   She stated that Aleve is what helps with her pain in the past, but that she was not sure if she could take that pain medication because of the other prescribed meds.  I offered to ask the nurse, but she asked if I could just give her some time alone. I alerted Terri about my concern of her not coming for her follow up appointment.  Pt later came out of her room and began to leave through the  back door.  I informed her that Dr. Elly Modena was on her way to see her and expressed my concern about her leaving, especially because she still had an IV. She stated that she had removed her IV herself and that she had to get out of here.   [place], Bcc Pager, [phone] 12:36 PM

## 2017-12-22 NOTE — MAU Provider Note (Signed)
History     CSN: [id number]  Arrival date and time: 12/22/17 6314   First Provider Initiated Contact with Patient 12/22/17 [phone]      Chief Complaint  Patient presents with  . Abdominal Pain   HPI Frances Hill 32 y.o. [redacted]w[redacted]d  Comes to MAU today via EMS from [facility]  (she drove to [facility] and her car is still there).  She was having pain during the night and not able to sleep.  She went to [facility] around 4 am as she was reluctant to return to MAU.  Has been seen multiple times in MAU, but thought she had not been treated well here in her previous vists - was tearful in telling about this.  Her pain was treated with medication at [facility] [place] and is now a 5/10 but has continued to have constant stabbing.  Had labs and ultrasound at [facility] and those results were reviewed.  She has not slept for 2 nights due to the pain.  She did take one percocet at home at 11 pm last night but it did not relieve the stabbing.  She went to [facility] around 4 am.  She has a history of going to an infertility specialist to become pregnant and is sad this is not a healthy pregnancy.  She has a history of endometriosis and states this feels like a flare with bloating, feeling gassy and having almost constant pain.  Received pain medication last through [facility] IV [facility]  Client did ask when her baby would come out and discussed the resolution of the ectopic with the medication and the passing of the uterine lining similar to a period.  Discussed that her pain could be coming from the fibroids, the endometriosis or the ectopic.  OB History    Gravida  1   Para  0   Term  0   Preterm  0   AB  0   Living  0     SAB  0   TAB  0   Ectopic  0   Multiple  0   Live Births              Past Medical History:  Diagnosis Date  . Anxiety   . Cyst of right ovary   . Endometriosis   . Fibroids   . History of ovarian cyst    12-25-2013  s/p removal  left ovarian endometrioma  . History of pelvic inflammatory disease   . History of trichomoniasis   . Sickle cell trait [facility])     Past Surgical History:  Procedure Laterality Date  . CHROMOPERTUBATION  04/24/2015   Procedure: CHROMOPERTUBATION;  Surgeon: Governor Specking, MD;  Location: [facility];  Service: Gynecology;;  . LAPAROSCOPIC LYSIS OF ADHESIONS  04/24/2015   Procedure: LAPAROSCOPIC LYSIS OF ADHESIONS AND EXCISION ENDOMETRIOTIC IMPLANTS;  Surgeon: Governor Specking, MD;  Location: [facility];  Service: Gynecology;;  . LAPAROSCOPIC OVARIAN CYSTECTOMY Right 04/24/2015   Procedure: LAPAROSCOPIC, RIGHT OVARIAN CYSTECTOMY,  GEL PORT ASSISTED MYOMECTOMY;  Surgeon: Governor Specking, MD;  Location: [facility];  Service: Gynecology;  Laterality: Right;  . LAPAROSCOPY N/A 12/25/2013   Procedure: OPERATIVE LAPAROSCOPY;  Surgeon: Osborne Oman, MD;  Location: [facility];  Service: Gynecology;  Laterality: N/A;  . SALPINGOOPHORECTOMY Left 12/25/2013   Procedure: SALPINGO OOPHORECTOMY;  Surgeon: Osborne Oman, MD;  Location: [facility];  Service: Gynecology;  Laterality: Left;    Family History  Problem Relation Age of Onset  . Aneurysm Mother 58       died of aneurysm, brain  . Cancer Mother 42       breast, twice  . Hypertension Father   . Depression Father   . Heart disease Father        valve disease  . Other Father        DDD  . Other Brother        DDD  . Sarcoidosis Brother   . Cancer Maternal Aunt        breast cancer  . Diabetes Neg Hx   . Stroke Neg Hx     Social History   Tobacco Use  . Smoking status: Never Smoker  . Smokeless tobacco: Never Used  Substance Use Topics  . Alcohol use: Yes    Comment: occasionally/socially  . Drug use: No    Allergies: No Known Allergies  Medications Prior to Admission  Medication Sig Dispense Refill Last Dose  . acetaminophen (TYLENOL) 325 MG tablet Take 650 mg by mouth every 6  (six) hours as needed for mild pain or headache.   12/21/2017 at Unknown time  . oxyCODONE-acetaminophen (PERCOCET/ROXICET) 5-325 MG tablet Take 1 tablet by mouth every 6 (six) hours as needed for severe pain. 6 tablet 0 12/21/2017 at Unknown time  . Prenatal Vit-Fe Fumarate-FA (PRENATAL MULTIVITAMIN) TABS tablet Take 1 tablet by mouth daily at 12 noon.   12/21/2017 at Unknown time    Review of Systems  Constitutional: Negative for fever.  Gastrointestinal: Positive for abdominal pain. Negative for constipation, diarrhea, nausea and vomiting.  Genitourinary: Positive for vaginal bleeding. Negative for dysuria and vaginal discharge.   Physical Exam   Blood pressure 134/79, pulse (!) 58, temperature 98.6 F (37 C), resp. rate 16, last menstrual period 10/22/2017, SpO2 100 %.  Physical Exam  Nursing note and vitals reviewed. Constitutional: She is oriented to person, place, and time. She appears well-developed and well-nourished.  HENT:  Head: Normocephalic.  Eyes: EOM are normal.  Neck: Neck supple.  GI: Soft. Bowel sounds are normal. There is tenderness. There is no rebound and no guarding.  Gentle exam done.  Pain is focused in LLQ.  Palpation in other areas worsens pain in LLQ.  Musculoskeletal: Normal range of motion.  Neurological: She is alert and oriented to person, place, and time.  Skin: Skin is warm and dry.  Psychiatric: She has a normal mood and affect.    MAU Course  Procedures Results for orders placed or performed during the hospital encounter of 12/22/17 (from the past 24 hour(s))  hCG, quantitative, pregnancy     Status: Abnormal   Collection Time: 12/22/17  4:25 AM  Result Value Ref Range   hCG, Beta Chain, Quant, S 299 (H) <5 mIU/mL  CBC with Differential     Status: Abnormal   Collection Time: 12/22/17  4:25 AM  Result Value Ref Range   WBC 7.2 4.0 - 10.5 K/uL   RBC 4.82 3.87 - 5.11 MIL/uL   Hemoglobin 12.5 12.0 - 15.0 g/dL   HCT 37.2 36.0 - 46.0 %   MCV  77.2 (L) 78.0 - 100.0 fL   MCH 25.9 (L) 26.0 - 34.0 pg   MCHC 33.6 30.0 - 36.0 g/dL   RDW 13.8 11.5 - 15.5 %   Platelets 274 150 - 400 K/uL   Neutrophils Relative % 61 %   Neutro Abs 4.4 1.7 -  7.7 K/uL   Lymphocytes Relative 28 %   Lymphs Abs 2.0 0.7 - 4.0 K/uL   Monocytes Relative 10 %   Monocytes Absolute 0.7 0.1 - 1.0 K/uL   Eosinophils Relative 1 %   Eosinophils Absolute 0.1 0.0 - 0.7 K/uL   Basophils Relative 0 %   Basophils Absolute 0.0 0.0 - 0.1 K/uL  Basic metabolic panel     Status: Abnormal   Collection Time: 12/22/17  4:25 AM  Result Value Ref Range   Sodium 137 135 - 145 mmol/L   Potassium 3.5 3.5 - 5.1 mmol/L   Chloride 105 101 - 111 mmol/L   CO2 23 22 - 32 mmol/L   Glucose, Bld 109 (H) 65 - 99 mg/dL   BUN <5 (L) 6 - 20 mg/dL   Creatinine, Ser 0.85 0.44 - 1.00 mg/dL   Calcium 8.9 8.9 - 10.3 mg/dL   GFR calc non Af Amer >60 >60 mL/min   GFR calc Af Amer >60 >60 mL/min   Anion gap 9 5 - 15  I-Stat beta hCG blood, ED ([facility], [facility], [facility] only)     Status: Abnormal   Collection Time: 12/22/17  4:26 AM  Result Value Ref Range   I-stat hCG, quantitative 287.2 (H) <5 mIU/mL   Comment 3           CLINICAL DATA:  Initial evaluation for persistent pelvic pain, history of recently identified ectopic pregnancy in the left adnexa, status post methotrexate. Beta HCG is decreasing.  EXAM: OBSTETRIC <14 WK [place] AND TRANSVAGINAL OB US  TECHNIQUE: Both transabdominal and transvaginal ultrasound examinations were performed for complete evaluation of the gestation as well as the maternal uterus, adnexal regions, and pelvic cul-de-sac. Transvaginal technique was performed to assess early pregnancy.  COMPARISON:  Prior ultrasound from 12/21/2017  FINDINGS: Intrauterine gestational sac: Not visualized.  Maternal uterus/adnexae: No IUP identified within the uterus. Endometrial complex relatively similar measuring approximately 17 mm. Uterus is enlarged with multiple fibroids,  stable from previous.  Patient is status post left self injury oophorectomy. Previously identified ring-like lesion within the left adnexa again seen, measuring 2.2 x 1.7 x 1.9 cm on today's study (previously 1.9 x 1.3 x 1.5 cm). Again, finding highly suspicious for ectopic pregnancy. Overall, this is similar to perhaps slightly less prominent as compared to previous exam.  Complex cystic mass within the right ovary again seen, measuring 3.9 x 3.7 x 3.2 cm. Homogeneous low-level internal echoes most consistent with endometrioma.  No free fluid.  IMPRESSION: 1. Persistent ring-like lesion within the left adnexa, concerning for ectopic pregnancy, measuring 2.2 x 1.7 x 1.9 cm on today's exam, relatively similar as compared to ultrasound from 1 day previous. No free fluid. 2. 3.9 cm right ovarian endometrioma. 3. Fibroid uterus.  MDM Consulted with Dr. Elly Modena and reviewed the plan of care.  Will observe for a period of time and then send home if she is able to tolerate the pain.  The chaplain is in MAU and the nurse asked the client if she would like to talk with the chaplain.  Chaplain spent 20 minutes with the patient and then came back and spoke with her again for approx. 30 minutes.    Chaplain came to the provider and said the patient wanted more pain medication.  I went to talk with the patient about the pain management choices and to see if she was in agreement with getting Toradol again.  Patient reminded me she had a saline lock  and could get the medication IV instead of IM.  Chaplain was in the room during our discussion.  Client requested a refill for more Percocet as she only has 4 of the 6 tablets left.  Explained that she may need just one percocet at a time, and that the medication is to help her manage the pain but based on the type of pain she is having, it may not be realistic to expect the pain to completely be gone with her medication.  She has seemed very comfortable  and has rested in bed while talking with the chaplain.  Usually Percocet is not given with ectopic pregnancy so I was a bit reluctant to prescribe more but then the client explained she was home alone and did not want to run out of her medication.  She began to be tearful.  Client began to say again that she is not comfortable getting care here at [facility].  (chaplain was in the room for this entire conversation.)  States:  She comes in with pain and we send her home and she has to pay $150 every time she comes in.  Reassured her that it was good that she came in with her pain and we have reassessed her condition.  The size of the ectopic pregnancy is approximately the same, the pregnancy hormone levels are going down, and there is no free fluid in the abdomen which shows she is not having bleeding into her abdomen at this time.  All of those things are reassuring but more follow up is needed and especially if the pain becomes worse than what she has experienced so far, she should return.  She began crying and saying we were not helpful, she still has pain, comes in for care and is sent home and still has pain.  Asked client if someone could come and stay with her at home - she is upset with the loss of her baby and the treatment for an ectopic pregnancy is a big thing - I don't want her to feel like she will not be safe at home - client is reporting headaches and dizziness and thought she would pass out at home - discussed these could be side effects of the narcotic medication also. Tried repeatedly to review the client's condition and to express caring for her, but she eventually asked if I would leave the room.  She and the chaplain were in the room.  She did ask the chaplain to leave the room as well.  I called Dr. Elly Modena from the nursing station to come and speak with the patient but while we were on the phone, the patient disconnected her saline lock and left through the back door to the MAU.  Had the  chaplain call out to her to say the doctor was on the way coming to see her but she left without talking to me or to her nurse.  Pain medication had been ordered prior to this conversation, but the nurse did not yet administer the medication.  Client left the prayer shawl that had been given to her by the chaplain in the room.  Assessment and Plan  Left AMA - Dr. Elly Modena will call the patient today.  Client left saying she was not coming back as scheduled on Sunday.   Iris Hairston L Kaian Fahs 12/22/2017, 9:01 AM

## 2017-12-22 NOTE — ED Notes (Signed)
[place] in room  Pain score now 5 out 10

## 2017-12-23 ENCOUNTER — Telehealth: Payer: Self-pay | Admitting: Obstetrics and Gynecology

## 2017-12-23 NOTE — Telephone Encounter (Signed)
Patient seen in MAU on 3/22 due to lower abdominal pain following medical treatment of a suspected ectopic pregnancy on 3/21. Patient left without being seen by me yesterday very upset with the care that she received at [facility] hospital. She expressed dissatisfaction with the fact that she constantly has to explain her pain and that a hospital specializing in [facility] health should be able to differentiate pain associated with endometriosis and other types of pain. I explained to the patient that pain associated with endometriosis is very subjective where patient with extensive visible disease sometimes do not experience pain. I explained to the patient that she is currently being monitored for this abnormal pregnancy which is concerning for an ectopic pregnancy.  I explained that it was important for her to follow up on Sunday for repeat blood work in order to confirm that she is responding to treatment. I also discussed performing a diagnostic laparoscopy given that her pain seems to worsen and that she is reluctant to return on Sunday. Patient requested that I called her back in the early part of the afternoon after she had a chance to review her options. I informed patient that I am scheduled to perform a cesarean section at 11:45 and will plan to contact her following the procedure.  She was advised to abstain from drinking and eating until she made her decision on having surgery today

## 2017-12-23 NOTE — Telephone Encounter (Signed)
Contacted patient following my surgery as previously discussed. Patient is not happy with the care received at [facility] hospital and does not want to return for future care. She feels that too much focus was placed on a previously made diagnosis of anxiety and less on the assessment of her pain. She states that she was unaware that I was planning on seeing her in MAU yesterday and had that information been relayed to her she would have stayed for evaluation.  I offered her to return to MAU for evaluation given that her pain is unchanged from yesterday with possible laparoscopy. Patient remains undecided. I informed patient that she could always be evaluated in a health care institution not affiliated with [facility] in order to ensure that she is appropriately cared for. She can do the same with the quant HCG which is scheduled to be drawn tomorrow. I stressed the importance of the need to follow up quant HCG levels following methotrexate administration. Patient verbalized understanding.

## 2017-12-25 DIAGNOSIS — Z3490 Encounter for supervision of normal pregnancy, unspecified, unspecified trimester: Secondary | ICD-10-CM | POA: Diagnosis not present

## 2017-12-25 DIAGNOSIS — O00102 Left tubal pregnancy without intrauterine pregnancy: Secondary | ICD-10-CM | POA: Diagnosis not present

## 2017-12-25 DIAGNOSIS — Z3A08 8 weeks gestation of pregnancy: Secondary | ICD-10-CM | POA: Diagnosis not present

## 2017-12-28 DIAGNOSIS — O00102 Left tubal pregnancy without intrauterine pregnancy: Secondary | ICD-10-CM | POA: Diagnosis not present

## 2018-01-23 DIAGNOSIS — S8002XA Contusion of left knee, initial encounter: Secondary | ICD-10-CM | POA: Diagnosis not present

## 2018-01-23 DIAGNOSIS — S138XXA Sprain of joints and ligaments of other parts of neck, initial encounter: Secondary | ICD-10-CM | POA: Diagnosis not present

## 2018-01-29 ENCOUNTER — Ambulatory Visit: Payer: Self-pay | Admitting: Medical

## 2018-01-30 ENCOUNTER — Ambulatory Visit: Payer: 59 | Admitting: Medical

## 2018-01-30 ENCOUNTER — Encounter: Payer: Self-pay | Admitting: Medical

## 2018-01-30 VITALS — BP 130/88 | HR 86 | Temp 98.1°F | Ht 66.0 in | Wt 250.6 lb

## 2018-01-30 DIAGNOSIS — E669 Obesity, unspecified: Secondary | ICD-10-CM | POA: Diagnosis not present

## 2018-01-30 DIAGNOSIS — N809 Endometriosis, unspecified: Secondary | ICD-10-CM

## 2018-01-30 DIAGNOSIS — Z0000 Encounter for general adult medical examination without abnormal findings: Secondary | ICD-10-CM | POA: Diagnosis not present

## 2018-01-30 DIAGNOSIS — Z113 Encounter for screening for infections with a predominantly sexual mode of transmission: Secondary | ICD-10-CM | POA: Diagnosis not present

## 2018-01-30 DIAGNOSIS — G8929 Other chronic pain: Secondary | ICD-10-CM | POA: Diagnosis not present

## 2018-01-30 DIAGNOSIS — Z803 Family history of malignant neoplasm of breast: Secondary | ICD-10-CM | POA: Diagnosis not present

## 2018-01-30 DIAGNOSIS — N979 Female infertility, unspecified: Secondary | ICD-10-CM | POA: Diagnosis not present

## 2018-01-30 DIAGNOSIS — R102 Pelvic and perineal pain: Secondary | ICD-10-CM

## 2018-01-30 DIAGNOSIS — N898 Other specified noninflammatory disorders of vagina: Secondary | ICD-10-CM | POA: Diagnosis not present

## 2018-01-30 MED ORDER — METRONIDAZOLE 500 MG PO TABS: 500.0000 mg | ORAL_TABLET | Freq: Three times a day (TID) | ORAL | 0 refills | Status: DC

## 2018-01-30 NOTE — Patient Instructions (Signed)
Thanks for trusting [place] with your health care and for coming in for a physical today.  Below are some general recommendations I have for you:  Yearly screenings See your eye doctor yearly for routine vision care. See your dentist yearly for routine dental care including hygiene visits twice yearly. See me here yearly for a routine physical and preventative care visit   Cancer screening Colon cancer screening:   You are up to date on colonoscopy screening.   You should still have stool card screening to check for blood in the stool every 3-5 years.  Please use the 3 cards given today to smear some poop/fecal matter on each card separately from 3 separate days.  Please mail them back so we can check them for blood  We will refer you for screening colonoscopy  We will refer you for diagnostic colonoscopy.  Breast cancer screening -  Please call to schedule your mammogram at your convenience.  [facility]   [phone]          [phone] [address], [place], [address] [place], [place] [address]        [place], [place] [address]  Osteoporosis screening/Bone Density test - I recommend a bone density test if you are over 29 years old, or under 87 years old if you have risk factors such as a bone fracture as an adult, parent with history of bone fracture as adult, thyroid disease, early menopause, or underweight for example.   Specific Concerns today:  .    Please follow up yearly for a physical.    I have included other useful information below for your review.  Preventative Care for Adults - Female      MAINTAIN REGULAR HEALTH EXAMS:  A routine yearly physical is a good way to check in with your primary care provider about your health and preventive screening. It is also an opportunity to share updates about your health and any concerns you have, and receive a thorough all-over exam.   Most  health insurance companies pay for at least some preventative services.  Check with your health plan for specific coverages.  WHAT PREVENTATIVE SERVICES DO WOMEN NEED?  Adult women should have their weight and blood pressure checked regularly.   Women age 71 and older should have their cholesterol levels checked regularly.  Women should be screened for cervical cancer with a Pap smear and pelvic exam beginning at either age 55, or 3 years after they become sexually activity.    Breast cancer screening generally begins at age 19 with a mammogram and breast exam by your primary care provider.    Beginning at age 68 and continuing to age 62, women should be screened for colorectal cancer.  Certain people may need continued testing until age 59.  Updating vaccinations is part of preventative care.  Vaccinations help protect against diseases such as the flu.  Osteoporosis is a disease in which the bones lose minerals and strength as we age. Women ages 59 and over should discuss this with their caregivers, as should women after menopause who have other risk factors.  Lab tests are generally done as part of preventative care to screen for anemia and blood disorders, to screen for problems with the kidneys and liver, to screen for bladder problems, to check blood sugar, and to check your cholesterol level.  Preventative services  generally include counseling about diet, exercise, avoiding tobacco, drugs, excessive alcohol consumption, and sexually transmitted infections.    GENERAL RECOMMENDATIONS FOR GOOD HEALTH:  Healthy diet:  Eat a variety of foods, including fruit, vegetables, animal or vegetable protein, such as meat, fish, chicken, and eggs, or beans, lentils, tofu, and grains, such as rice.  Drink plenty of water daily.  Decrease saturated fat in the diet, avoid lots of red meat, processed foods, sweets, fast foods, and fried foods.  Exercise:  Aerobic exercise helps maintain good  heart health. At least 30-40 minutes of moderate-intensity exercise is recommended. For example, a brisk walk that increases your heart rate and breathing. This should be done on most days of the week.   Find a type of exercise or a variety of exercises that you enjoy so that it becomes a part of your daily life.  Examples are running, walking, swimming, water aerobics, and biking.  For motivation and support, explore group exercise such as aerobic class, spin class, Zumba, Yoga,or  martial arts, etc.    Set exercise goals for yourself, such as a certain weight goal, walk or run in a race such as a 5k walk/run.  Speak to your primary care provider about exercise goals.  Disease prevention:  If you smoke or chew tobacco, find out from your caregiver how to quit. It can literally save your life, no matter how long you have been a tobacco user. If you do not use tobacco, never begin.   Maintain a healthy diet and normal weight. Increased weight leads to problems with blood pressure and diabetes.   The Body Mass Index or BMI is a way of measuring how much of your body is fat. Having a BMI above 27 increases the risk of heart disease, diabetes, hypertension, stroke and other problems related to obesity. Your caregiver can help determine your BMI and based on it develop an exercise and dietary program to help you achieve or maintain this important measurement at a healthful level.  High blood pressure causes heart and blood vessel problems.  Persistent high blood pressure should be treated with medicine if weight loss and exercise do not work.   Fat and cholesterol leaves deposits in your arteries that can block them. This causes heart disease and vessel disease elsewhere in your body.  If your cholesterol is found to be high, or if you have heart disease or certain other medical conditions, then you may need to have your cholesterol monitored frequently and be treated with medication.   Ask if you  should have a cardiac stress test if your history suggests this. A stress test is a test done on a treadmill that looks for heart disease. This test can find disease prior to there being a problem.  Menopause can be associated with physical symptoms and risks. Hormone replacement therapy is available to decrease these. You should talk to your caregiver about whether starting or continuing to take hormones is right for you.   Osteoporosis is a disease in which the bones lose minerals and strength as we age. This can result in serious bone fractures. Risk of osteoporosis can be identified using a bone density scan. Women ages 48 and over should discuss this with their caregivers, as should women after menopause who have other risk factors. Ask your caregiver whether you should be taking a calcium supplement and Vitamin D, to reduce the rate of osteoporosis.   Avoid drinking alcohol in excess (more than two drinks per  day).  Avoid use of street drugs. Do not share needles with anyone. Ask for professional help if you need assistance or instructions on stopping the use of alcohol, cigarettes, and/or drugs.  Brush your teeth twice a day with fluoride toothpaste, and floss once a day. Good oral hygiene prevents tooth decay and gum disease. The problems can be painful, unattractive, and can cause other health problems. Visit your dentist for a routine oral and dental check up and preventive care every 6-12 months.   Look at your skin regularly.  Use a mirror to look at your back. Notify your caregivers of changes in moles, especially if there are changes in shapes, colors, a size larger than a pencil eraser, an irregular border, or development of new moles.  Safety:  Use seatbelts 100% of the time, whether driving or as a passenger.  Use safety devices such as hearing protection if you work in environments with loud noise or significant background noise.  Use safety glasses when doing any work that could send  debris in to the eyes.  Use a helmet if you ride a bike or motorcycle.  Use appropriate safety gear for contact sports.  Talk to your caregiver about gun safety.  Use sunscreen with a SPF (or skin protection factor) of 15 or greater.  Lighter skinned people are at a greater risk of skin cancer. Don't forget to also wear sunglasses in order to protect your eyes from too much damaging sunlight. Damaging sunlight can accelerate cataract formation.   Practice safe sex. Use condoms. Condoms are used for birth control and to help reduce the spread of sexually transmitted infections (or STIs).  Some of the STIs are gonorrhea (the clap), chlamydia, syphilis, trichomonas, herpes, HPV (human papilloma virus) and HIV (human immunodeficiency virus) which causes AIDS. The herpes, HIV and HPV are viral illnesses that have no cure. These can result in disability, cancer and death.   Keep carbon monoxide and smoke detectors in your home functioning at all times. Change the batteries every 6 months or use a model that plugs into the wall.   Vaccinations:  Stay up to date with your tetanus shots and other required immunizations. You should have a booster for tetanus every 10 years. Be sure to get your flu shot every year, since 5%-20% of the [place] population comes down with the flu. The flu vaccine changes each year, so being vaccinated once is not enough. Get your shot in the fall, before the flu season peaks.   Other vaccines to consider:  Human Papilloma Virus or HPV causes cancer of the cervix, and other infections that can be transmitted from person to person. There is a vaccine for HPV, and females should get immunized between the ages of 79 and 35. It requires a series of 3 shots.   Pneumococcal vaccine to protect against certain types of pneumonia.  This is normally recommended for adults age 22 or older.  However, adults younger than 31 years old with certain underlying conditions such as diabetes, heart or  lung disease should also receive the vaccine.  Shingles vaccine to protect against Varicella Zoster if you are older than age 31, or younger than 31 years old with certain underlying illness.  If you have not had the Shingrix vaccine, please call your insurer to inquire about coverage for the Shingrix vaccine given in 2 doses.   Some insurers cover this vaccine after age 2, some cover this after age 35.  If your insurer covers  this, then call to schedule appointment to have this vaccine here  Hepatitis A vaccine to protect against a form of infection of the liver by a virus acquired from food.  Hepatitis B vaccine to protect against a form of infection of the liver by a virus acquired from blood or body fluids, particularly if you work in health care.  If you plan to travel internationally, check with your local health department for specific vaccination recommendations.  Cancer Screening:  Breast cancer screening is essential to preventive care for women. All women age 104 and older should perform a breast self-exam every month. At age 40 and older, women should have their caregiver complete a breast exam each year. Women at ages 63 and older should have a mammogram (x-ray film) of the breasts. Your caregiver can discuss how often you need mammograms.    Cervical cancer screening includes taking a Pap smear (sample of cells examined under a microscope) from the cervix (end of the uterus). It also includes testing for HPV (Human Papilloma Virus, which can cause cervical cancer). Screening and a pelvic exam should begin at age 7, or 3 years after a woman becomes sexually active. Screening should occur every year, with a Pap smear but no HPV testing, up to age 78. After age 65, you should have a Pap smear every 3 years with HPV testing, if no HPV was found previously.   Most routine colon cancer screening begins at the age of 50. On a yearly basis, doctors may provide special easy to use take-home  tests to check for hidden blood in the stool. Sigmoidoscopy or colonoscopy can detect the earliest forms of colon cancer and is life saving. These tests use a small camera at the end of a tube to directly examine the colon. Speak to your caregiver about this at age 73, when routine screening begins (and is repeated every 5 years unless early forms of pre-cancerous polyps or small growths are found).

## 2018-01-30 NOTE — Progress Notes (Signed)
Subjective:   HPI  Frances Hill is a 32 y.o. female who presents for Chief Complaint  Patient presents with  . Follow-up    D&C    Medical care team includes: Medicine, [place] here for primary care Dentist Eye doctor Dr. Abigail Butts and high risk OB/Gyn with pregnancy  Concerns: She had recent ectopic pregnancy, was seen in the ED, had treatment with injectable medication.  Has f/u planned with OB/Gyn  Here today for physical, screening   Has vaginal odor.  Has hx/o BV, thinks she has BV currently.   Wants STD screen.  She is in monogamous relationship.  Reviewed their medical, surgical, family, social, medication, and allergy history and updated chart as appropriate.  Past Medical History:  Diagnosis Date  . Anxiety   . Cyst of right ovary   . Endometriosis   . Fibroids   . History of ovarian cyst    12-25-2013  s/p removal left ovarian endometrioma  . History of pelvic inflammatory disease   . History of trichomoniasis   . Sickle cell trait [facility])     Past Surgical History:  Procedure Laterality Date  . CHROMOPERTUBATION  04/24/2015   Procedure: CHROMOPERTUBATION;  Surgeon: Governor Specking, MD;  Location: [facility];  Service: Gynecology;;  . LAPAROSCOPIC LYSIS OF ADHESIONS  04/24/2015   Procedure: LAPAROSCOPIC LYSIS OF ADHESIONS AND EXCISION ENDOMETRIOTIC IMPLANTS;  Surgeon: Governor Specking, MD;  Location: [facility];  Service: Gynecology;;  . LAPAROSCOPIC OVARIAN CYSTECTOMY Right 04/24/2015   Procedure: LAPAROSCOPIC, RIGHT OVARIAN CYSTECTOMY,  GEL PORT ASSISTED MYOMECTOMY;  Surgeon: Governor Specking, MD;  Location: [facility];  Service: Gynecology;  Laterality: Right;  . LAPAROSCOPY N/A 12/25/2013   Procedure: OPERATIVE LAPAROSCOPY;  Surgeon: Osborne Oman, MD;  Location: [facility];  Service: Gynecology;  Laterality: N/A;  . SALPINGOOPHORECTOMY Left 12/25/2013   Procedure: SALPINGO OOPHORECTOMY;   Surgeon: Osborne Oman, MD;  Location: [facility];  Service: Gynecology;  Laterality: Left;    Social History   Socioeconomic History  . Marital status: Single    Spouse name: Not on file  . Number of children: Not on file  . Years of education: Not on file  . Highest education level: Not on file  Occupational History  . Not on file  Social Needs  . Financial resource strain: Not on file  . Food insecurity:    Worry: Not on file    Inability: Not on file  . Transportation needs:    Medical: Not on file    Non-medical: Not on file  Tobacco Use  . Smoking status: Never Smoker  . Smokeless tobacco: Never Used  Substance and Sexual Activity  . Alcohol use: Yes    Comment: occasionally/socially  . Drug use: No  . Sexual activity: Yes    Birth control/protection: Condom  Lifestyle  . Physical activity:    Days per week: Not on file    Minutes per session: Not on file  . Stress: Not on file  Relationships  . Social connections:    Talks on phone: Not on file    Gets together: Not on file    Attends religious service: Not on file    Active member of club or organization: Not on file    Attends meetings of clubs or organizations: Not on file    Relationship status: Not on file  . Intimate partner violence:    Fear of current or ex partner: Not  on file    Emotionally abused: Not on file    Physically abused: Not on file    Forced sexual activity: Not on file  Other Topics Concern  . Not on file  Social History Narrative   [employer], lives alone, no children, Education officer, museum at [employer], exercise - 3-4 times per week, walks 1-2 miles, weights at [employer], other cardio, diet is good, improved from 2014    Family History  Problem Relation Age of Onset  . Aneurysm Mother 110       died of aneurysm, brain  . Cancer Mother 58       breast, twice  . Hypertension Father   . Depression Father   . Heart disease Father        valve disease  . Other Father         DDD  . Other Brother        DDD  . Sarcoidosis Brother   . Cancer Maternal Aunt        breast cancer  . Diabetes Paternal Grandmother   . Stroke Neg Hx      Current Outpatient Medications:  .  metroNIDAZOLE (FLAGYL) 500 MG tablet, Take 1 tablet (500 mg total) by mouth 3 (three) times daily., Disp: 21 tablet, Rfl: 0  No Known Allergies   Review of Systems Constitutional: -fever, -chills, -sweats, -unexpected weight change, -decreased appetite, -fatigue Allergy: -sneezing, -itching, -congestion Dermatology: -changing moles, --rash, -lumps ENT: -runny nose, -ear pain, -sore throat, -hoarseness, -sinus pain, -teeth pain, - ringing in ears, -hearing loss, -nosebleeds Cardiology: -chest pain, -palpitations, -swelling, -difficulty breathing when lying flat, -waking up short of breath Respiratory: -cough, -shortness of breath, -difficulty breathing with exercise or exertion, -wheezing, -coughing up blood Gastroenterology: -abdominal pain, -nausea, -vomiting, -diarrhea, -constipation, -blood in stool, -changes in bowel movement, -difficulty swallowing or eating Hematology: -bleeding, -bruising  Musculoskeletal: -joint aches, -muscle aches, -joint swelling, -back pain, -neck pain, -cramping, -changes in gait Ophthalmology: denies vision changes, eye redness, itching, discharge Urology: -burning with urination, -difficulty urinating, -blood in urine, -urinary frequency, -urgency, -incontinence Neurology: -headache, -weakness, -tingling, -numbness, -memory loss, -falls, -dizziness Psychology: -depressed mood, -agitation, -sleep problems Breast/gyn: -breast tenderness, -discharge, -lumps, +vaginal discharge,- irregular periods, -heavy periods     Objective:  BP 130/88   Pulse 86   Temp 98.1 F (36.7 C) (Oral)   Ht 5\' 6"  (1.676 m)   Wt 250 lb 9.6 oz (113.7 kg)   LMP 10/22/2017   SpO2 98%   BMI 40.45 kg/m   General appearance: alert, no distress, WD/WN, African American  female Skin: no worrisome lesions HEENT: normocephalic, conjunctiva/corneas normal, sclerae anicteric, PERRLA, EOMi, nares patent, no discharge or erythema, pharynx normal Oral cavity: MMM, tongue normal, teeth normal Neck: supple, no lymphadenopathy, no thyromegaly, no masses, normal ROM, no bruits Chest: non tender, normal shape and expansion Heart: RRR, normal S1, S2, no murmurs Lungs: CTA bilaterally, no wheezes, rhonchi, or rales Abdomen: +bs, soft, non tender, non distended, no masses, no hepatomegaly, no splenomegaly, no bruits Back: non tender, normal ROM, no scoliosis Musculoskeletal: upper extremities non tender, no obvious deformity, normal ROM throughout, lower extremities non tender, no obvious deformity, normal ROM throughout Extremities: no edema, no cyanosis, no clubbing Pulses: 2+ symmetric, upper and lower extremities, normal cap refill Neurological: alert, oriented x 3, CN2-12 intact, strength normal upper extremities and lower extremities, sensation normal throughout, DTRs 2+ throughout, no cerebellar signs, gait normal Psychiatric: normal affect, behavior normal, pleasant  Breast: nontender, no masses or lumps, no skin changes, no nipple discharge or inversion, no axillary lymphadenopathy Gyn: Normal external genitalia without lesions, vagina with normal mucosa, cervix without lesions, no cervical motion tenderness, moderate creamy white abnormal vaginal discharge.  Uterus and adnexa not enlarged, nontender, no masses.  Exam chaperoned by nurse. Rectal: deferred   Assessment and Plan :   Encounter Diagnoses  Name Primary?  . Routine general medical examination at a health care facility Yes  . Chronic pelvic pain in female   . Screen for STD (sexually transmitted disease)   . Vaginal discharge   . Female fertility problem   . Endometriosis   . Family history of breast cancer   . Obesity with serious comorbidity, unspecified classification, unspecified obesity type      Physical exam - discussed and counseled on healthy lifestyle, diet, exercise, preventative care, vaccinations, sick and well care, proper use of emergency dept and after hours care, and addressed their concerns.    Health screening: See your eye doctor yearly for routine vision care. See your dentist yearly for routine dental care including hygiene visits twice yearly.  Discussed STD testing, discussed prevention, condom use, means of transmission  Cancer screening Discussed and advised monthly self breast exams, mammograms every 2 years for now, yearly at 32yo Discussed pap smear recommendations.   Pap smear from 10/2016 reviewed  Vaccinations: Advised yearly influenza vaccine She is up to date on Tdap  Acute issues discussed: Vagina odor /discharge -begin trial of Metronidazole  Separate significant chronic issues discussed: Female fertility issues, endometriosis - f/u with gynecology for routine care Obesity - counseled on diet, exercise, need to lose weight  Recent ectopic pregnancy - reviewed 12/2017 ED records, labs.    Airika was seen today for follow-up.  Diagnoses and all orders for this visit:  Routine general medical examination at a health care facility -     HIV antibody -     RPR -     GC/Chlamydia Probe Amp -     Hemoglobin A1c -     TSH -     Lipid panel -     VITAMIN D 25 Hydroxy (Vit-D Deficiency, Fractures) -     POCT Wet Prep [facility])  Chronic pelvic pain in female  Screen for STD (sexually transmitted disease) -     HIV antibody -     RPR -     GC/Chlamydia Probe Amp  Vaginal discharge -     POCT Wet Prep [facility])  Female fertility problem  Endometriosis  Family history of breast cancer  Obesity with serious comorbidity, unspecified classification, unspecified obesity type -     Hemoglobin A1c -     TSH  Other orders -     metroNIDAZOLE (FLAGYL) 500 MG tablet; Take 1 tablet (500 mg total) by mouth 3 (three) times  daily.   Follow-up pending labs, yearly for physical

## 2018-01-31 ENCOUNTER — Other Ambulatory Visit: Payer: Self-pay | Admitting: Medical

## 2018-01-31 LAB — HEMOGLOBIN A1C
Est. average glucose Bld gHb Est-mCnc: 114 mg/dL
Hgb A1c MFr Bld: 5.6 % (ref 4.8–5.6)

## 2018-01-31 LAB — LIPID PANEL
Chol/HDL Ratio: 3.3 ratio (ref 0.0–4.4)
Cholesterol, Total: 179 mg/dL (ref 100–199)
HDL: 55 mg/dL (ref >39)
LDL Calculated: 112 mg/dL — ABNORMAL HIGH (ref 0–99)
Triglycerides: 58 mg/dL (ref 0–149)
VLDL Cholesterol Cal: 12 mg/dL (ref 5–40)

## 2018-01-31 LAB — HIV ANTIBODY (ROUTINE TESTING W REFLEX): HIV Screen 4th Generation wRfx: NONREACTIVE

## 2018-01-31 LAB — VITAMIN D 25 HYDROXY (VIT D DEFICIENCY, FRACTURES): Vit D, 25-Hydroxy: 9.3 ng/mL — ABNORMAL LOW (ref 30.0–100.0)

## 2018-01-31 LAB — TSH: TSH: 1.46 u[IU]/mL (ref 0.450–4.500)

## 2018-01-31 LAB — RPR: RPR Ser Ql: NONREACTIVE

## 2018-01-31 MED ORDER — VITAMIN D (ERGOCALCIFEROL) 1.25 MG (50000 UNIT) PO CAPS: 50000.0000 [IU] | ORAL_CAPSULE | ORAL | 0 refills | Status: DC

## 2018-02-02 LAB — GC/CHLAMYDIA PROBE AMP
Chlamydia trachomatis, NAA: NEGATIVE
Neisseria gonorrhoeae by PCR: NEGATIVE

## 2018-02-05 ENCOUNTER — Ambulatory Visit: Payer: 59 | Admitting: Medical

## 2018-02-05 NOTE — Progress Notes (Signed)
Patient results will be given during her appt today.

## 2018-02-07 LAB — POCT WET PREP (WET MOUNT): Trichomonas Wet Prep HPF POC: ABSENT

## 2018-03-01 ENCOUNTER — Encounter: Payer: Self-pay | Admitting: Family Medicine

## 2018-04-18 DIAGNOSIS — N801 Endometriosis of ovary: Secondary | ICD-10-CM | POA: Diagnosis not present

## 2018-04-18 DIAGNOSIS — N83291 Other ovarian cyst, right side: Secondary | ICD-10-CM | POA: Diagnosis not present

## 2018-04-18 DIAGNOSIS — N840 Polyp of corpus uteri: Secondary | ICD-10-CM | POA: Diagnosis not present

## 2018-05-01 ENCOUNTER — Other Ambulatory Visit: Payer: Self-pay | Admitting: Medical

## 2018-05-25 DIAGNOSIS — J018 Other acute sinusitis: Secondary | ICD-10-CM | POA: Diagnosis not present

## 2018-05-25 DIAGNOSIS — R0982 Postnasal drip: Secondary | ICD-10-CM | POA: Diagnosis not present

## 2018-05-25 DIAGNOSIS — H6983 Other specified disorders of Eustachian tube, bilateral: Secondary | ICD-10-CM | POA: Diagnosis not present

## 2018-05-26 DIAGNOSIS — J018 Other acute sinusitis: Secondary | ICD-10-CM | POA: Diagnosis not present

## 2018-05-26 DIAGNOSIS — H6503 Acute serous otitis media, bilateral: Secondary | ICD-10-CM | POA: Diagnosis not present

## 2018-06-02 DIAGNOSIS — B373 Candidiasis of vulva and vagina: Secondary | ICD-10-CM | POA: Diagnosis not present

## 2018-06-06 ENCOUNTER — Ambulatory Visit (INDEPENDENT_AMBULATORY_CARE_PROVIDER_SITE_OTHER)
Admission: RE | Admit: 2018-06-06 | Discharge: 2018-06-06 | Disposition: A | Discharge status: Home / Self Care | Payer: 59 | Source: Ambulatory Visit | Attending: Pulmonary Disease | Admitting: Pulmonary Disease

## 2018-06-06 ENCOUNTER — Encounter: Payer: Self-pay | Admitting: Pulmonary Disease

## 2018-06-06 ENCOUNTER — Ambulatory Visit: Payer: 59 | Admitting: Pulmonary Disease

## 2018-06-06 VITALS — BP 120/82 | HR 81 | Ht 66.5 in | Wt 250.0 lb

## 2018-06-06 DIAGNOSIS — R0789 Other chest pain: Secondary | ICD-10-CM

## 2018-06-06 DIAGNOSIS — R0683 Snoring: Secondary | ICD-10-CM

## 2018-06-06 DIAGNOSIS — R0602 Shortness of breath: Secondary | ICD-10-CM | POA: Diagnosis not present

## 2018-06-06 NOTE — Progress Notes (Signed)
Frances Hill    [id number]    July 11, 1986  Primary Care Physician:Medicine, [facility]  Referring Physician: Medicine, [place], [place] [address]  Chief complaint:   History of snoring, witnessed apnea  HPI:  Patient with a history of snoring, witnessed apneas Has always snored About 20 pound weight gain recently Gets about 6 hours of sleep every night Sleep is nonrestorative Occasional headache History of forgetfulness Dad has OSA  Usually goes to bed about 9-10 PM Gets up in the morning about 6 AM  Occupation: Education officer, museum Smoking history: Non-smoker  Outpatient Encounter Medications as of 06/06/2018  Medication Sig  . escitalopram (LEXAPRO) 10 MG tablet Take 10 mg by mouth daily.  . [DISCONTINUED] metroNIDAZOLE (FLAGYL) 500 MG tablet Take 1 tablet (500 mg total) by mouth 3 (three) times daily.  . [DISCONTINUED] Vitamin D, Ergocalciferol, (DRISDOL) 50000 units CAPS capsule Take 1 capsule (50,000 Units total) by mouth every 7 (seven) days.   No facility-administered encounter medications on file as of 06/06/2018.     Allergies as of 06/06/2018  . (No Known Allergies)    Past Medical History:  Diagnosis Date  . Anxiety   . Cyst of right ovary   . Endometriosis   . Fibroids   . History of ovarian cyst    12-25-2013  s/p removal left ovarian endometrioma  . History of pelvic inflammatory disease   . History of trichomoniasis   . Sickle cell trait [facility])     Past Surgical History:  Procedure Laterality Date  . CHROMOPERTUBATION  04/24/2015   Procedure: CHROMOPERTUBATION;  Surgeon: Governor Specking, MD;  Location: [facility];  Service: Gynecology;;  . LAPAROSCOPIC LYSIS OF ADHESIONS  04/24/2015   Procedure: LAPAROSCOPIC LYSIS OF ADHESIONS AND EXCISION ENDOMETRIOTIC IMPLANTS;  Surgeon: Governor Specking, MD;  Location: [facility];  Service: Gynecology;;  .  LAPAROSCOPIC OVARIAN CYSTECTOMY Right 04/24/2015   Procedure: LAPAROSCOPIC, RIGHT OVARIAN CYSTECTOMY,  GEL PORT ASSISTED MYOMECTOMY;  Surgeon: Governor Specking, MD;  Location: [facility];  Service: Gynecology;  Laterality: Right;  . LAPAROSCOPY N/A 12/25/2013   Procedure: OPERATIVE LAPAROSCOPY;  Surgeon: Osborne Oman, MD;  Location: [facility];  Service: Gynecology;  Laterality: N/A;  . SALPINGOOPHORECTOMY Left 12/25/2013   Procedure: SALPINGO OOPHORECTOMY;  Surgeon: Osborne Oman, MD;  Location: [facility];  Service: Gynecology;  Laterality: Left;    Family History  Problem Relation Age of Onset  . Aneurysm Mother 55       died of aneurysm, brain  . Cancer Mother 27       breast, twice  . Hypertension Father   . Depression Father   . Heart disease Father        valve disease  . Other Father        DDD  . Other Brother        DDD  . Sarcoidosis Brother   . Cancer Maternal Aunt        breast cancer  . Diabetes Paternal Grandmother   . Stroke Neg Hx     Social History   Socioeconomic History  . Marital status: Single    Spouse name: Not on file  . Number of children: Not on file  . Years of education: Not on file  . Highest education level: Not on file  Occupational History  . Not on file  Social Needs  . Financial  resource strain: Not on file  . Food insecurity:    Worry: Not on file    Inability: Not on file  . Transportation needs:    Medical: Not on file    Non-medical: Not on file  Tobacco Use  . Smoking status: Never Smoker  . Smokeless tobacco: Never Used  Substance and Sexual Activity  . Alcohol use: Yes    Comment: occasionally/socially  . Drug use: No  . Sexual activity: Yes    Birth control/protection: Condom  Lifestyle  . Physical activity:    Days per week: Not on file    Minutes per session: Not on file  . Stress: Not on file  Relationships  . Social connections:    Talks on phone: Not on file    Gets together: Not on file     Attends religious service: Not on file    Active member of club or organization: Not on file    Attends meetings of clubs or organizations: Not on file    Relationship status: Not on file  . Intimate partner violence:    Fear of current or ex partner: Not on file    Emotionally abused: Not on file    Physically abused: Not on file    Forced sexual activity: Not on file  Other Topics Concern  . Not on file  Social History Narrative   Frances Hill, lives alone, no children, Education officer, museum at [employer], exercise - 3-4 times per week, walks 1-2 miles, weights at [employer], other cardio, diet is good, improved from 2014    Review of Systems  Constitutional: Negative.   HENT: Negative.   Eyes: Negative.   Respiratory: Positive for apnea.   Gastrointestinal: Negative.   Endocrine: Negative.   Genitourinary: Positive for dyspareunia.  Musculoskeletal: Negative.   Psychiatric/Behavioral: Positive for sleep disturbance.    Vitals:   06/06/18 1553  BP: 120/82  Pulse: 81  SpO2: 97%    Physical Exam  Constitutional: She is oriented to person, place, and time. She appears well-developed and well-nourished.  HENT:  Head: Normocephalic and atraumatic.  Mallampati 4  Eyes: Pupils are equal, round, and reactive to light. Conjunctivae are normal. Right eye exhibits no discharge.  Neck: Normal range of motion. Neck supple. No tracheal deviation present. No thyromegaly present.  Cardiovascular: Normal rate and regular rhythm.  Pulmonary/Chest: Effort normal and breath sounds normal. No respiratory distress.  Abdominal: Soft. Bowel sounds are normal. She exhibits no distension. There is no tenderness.  Musculoskeletal: Normal range of motion. She exhibits no edema.  Neurological: She is alert and oriented to person, place, and time. No cranial nerve deficit.  Skin: Skin is warm and dry. No erythema.  Psychiatric: She has a normal mood and affect.   Data Reviewed: Records  reviewed  Assessment:  High probability of significant sleep disordered breathing  Nonrestorative sleep  History of endometriosis  Plan/Recommendations:  Obtain a home sleep study  Obtain a chest x-ray  Pathophysiology of sleep disordered breathing discussed Treatment options of sleep disordered breathing discussed  I will see you back in the office about 6 to 8 weeks following initiation of treatment  Sherrilyn Rist MD [facility] 06/06/2018, 3:58 PM  CC: Medicine, [employer]*

## 2018-06-06 NOTE — Patient Instructions (Signed)
High probability of significant sleep disordered breathing   We will schedule a home sleep study  Possibility of treatment with an auto titrating CPAP  Obtain a chest x-ray  I will see you back in the office about 6 to 8 weeks following initiation of treatment if needed

## 2018-06-08 ENCOUNTER — Telehealth: Payer: Self-pay | Admitting: Pulmonary Disease

## 2018-06-08 NOTE — Telephone Encounter (Signed)
Attempted to contact pt. I did not receive an answer. There was no option for me to leave a message. Will try back.  

## 2018-06-08 NOTE — Telephone Encounter (Signed)
Spoke with patient, made patient aware that results have not been finalized, however I would message her doctor to make aware she was looking for the results. Voiced understanding.   AO please advise.

## 2018-06-08 NOTE — Telephone Encounter (Signed)
Pt is calling back [phone]

## 2018-06-08 NOTE — Telephone Encounter (Signed)
Spoke with pt. She is requesting her CXR results. Advised her that we are still waiting on Dr. Judson Roch response.  Dr. Ander Slade - please advise. Thanks.

## 2018-06-08 NOTE — Telephone Encounter (Signed)
Call patient  I reviewed the x-ray  Normal chest x-ray

## 2018-06-11 NOTE — Telephone Encounter (Signed)
Patient returned call, CB is [phone] (work) or [phone] (cell).

## 2018-06-11 NOTE — Telephone Encounter (Signed)
ATC pt, no answer. Left message for pt to call back.  

## 2018-06-11 NOTE — Telephone Encounter (Signed)
Spoke with pt, aware of results.  While on the phone pt also asked about status of HST.  I advised that we are typically booked out approx 2 weeks with these tests, and that the PCCs will be contacting her to get this scheduled.  Order was placed on 9/4 for HST.  Pt expressed understanding.  Nothing further needed at this time.

## 2018-06-20 DIAGNOSIS — G4733 Obstructive sleep apnea (adult) (pediatric): Secondary | ICD-10-CM

## 2018-06-21 DIAGNOSIS — G4733 Obstructive sleep apnea (adult) (pediatric): Secondary | ICD-10-CM | POA: Diagnosis not present

## 2018-06-22 ENCOUNTER — Other Ambulatory Visit: Payer: Self-pay | Admitting: *Deleted

## 2018-06-22 DIAGNOSIS — R0683 Snoring: Secondary | ICD-10-CM

## 2018-06-29 ENCOUNTER — Telehealth: Payer: Self-pay | Admitting: Pulmonary Disease

## 2018-06-29 DIAGNOSIS — G4733 Obstructive sleep apnea (adult) (pediatric): Secondary | ICD-10-CM

## 2018-06-29 NOTE — Telephone Encounter (Signed)
Dr. Ander Slade has reviewed the home sleep test this showed Mild obsturctive sleep apnea.  With mild oxygen desaturation.  Recommendations   Treatment options are CPAP with the settings auto 5 to 15.    Weight loss measures .   Advise against driving while sleepy & against medication with sedative side effects.    Make appointment for 8 to 10 weeks for compliance with download with Dr. Ander Slade.   Patient is aware and this and I have placed this order nothing further is needed.

## 2018-07-16 DIAGNOSIS — G4733 Obstructive sleep apnea (adult) (pediatric): Secondary | ICD-10-CM | POA: Diagnosis not present

## 2018-07-30 DIAGNOSIS — S8002XA Contusion of left knee, initial encounter: Secondary | ICD-10-CM | POA: Diagnosis not present

## 2018-08-16 DIAGNOSIS — G4733 Obstructive sleep apnea (adult) (pediatric): Secondary | ICD-10-CM | POA: Diagnosis not present

## 2018-08-27 ENCOUNTER — Telehealth: Payer: Self-pay | Admitting: Family Medicine

## 2018-08-27 ENCOUNTER — Ambulatory Visit: Payer: 59 | Admitting: Obstetrics & Gynecology

## 2018-08-27 NOTE — Progress Notes (Deleted)
   Patient did not show up today for her scheduled appointment.   Zackari Ruane, MD, FACOG Obstetrician & Gynecologist, [facility], [facility]  

## 2018-08-27 NOTE — Telephone Encounter (Signed)
Pt was late to her appointment that was scheduled at 8:15 and she arrived at 8:40. Informed patient that she had to rescheduled and I would look for the next available appointment. I was already at the end of December when the patient asked if the doctor she was seeing did not have any appointments. I answered no that I was already at the end of December. She informed be saying that's okay she would find already OBGYN and walked away from the window.

## 2018-09-05 ENCOUNTER — Ambulatory Visit: Payer: 59 | Admitting: Pulmonary Disease

## 2018-09-10 ENCOUNTER — Ambulatory Visit: Payer: 59 | Admitting: Pulmonary Disease

## 2018-09-15 DIAGNOSIS — G4733 Obstructive sleep apnea (adult) (pediatric): Secondary | ICD-10-CM | POA: Diagnosis not present

## 2018-10-01 ENCOUNTER — Ambulatory Visit: Payer: 59 | Admitting: Pulmonary Disease

## 2018-10-16 ENCOUNTER — Ambulatory Visit: Payer: 59 | Admitting: Pulmonary Disease

## 2018-10-16 ENCOUNTER — Encounter: Payer: Self-pay | Admitting: Pulmonary Disease

## 2018-10-16 VITALS — BP 126/82 | HR 72 | Ht 66.0 in | Wt 244.0 lb

## 2018-10-16 DIAGNOSIS — G4733 Obstructive sleep apnea (adult) (pediatric): Secondary | ICD-10-CM

## 2018-10-16 DIAGNOSIS — Z9989 Dependence on other enabling machines and devices: Secondary | ICD-10-CM | POA: Diagnosis not present

## 2018-10-16 MED ORDER — ESZOPICLONE 2 MG PO TABS: 2.0000 mg | ORAL_TABLET | Freq: Every evening | ORAL | 2 refills | Status: DC | PRN

## 2018-10-16 NOTE — Patient Instructions (Signed)
Obstructive sleep apnea on CPAP therapy Some difficulty with initiating sleep with adjustment to CPAP therapy  We will prescribe Lunesta 2 mg nightly Encouraged to continue using CPAP on a regular basis  I will see you back in the office in about 3 months

## 2018-10-16 NOTE — Progress Notes (Signed)
Frances Hill    [id number]    [birth year]  Primary Care Physician:Medicine, [facility]  Referring Physician: Medicine, [place], [place] [address]  Chief complaint:   History of snoring, witnessed apnea  HPI: Diagnosed with obstructive sleep apnea now using CPAP Trying to adjust to CPAP use, occasionally finds it difficult to fall asleep with the mask on The days she is able to use the mask through the night, she feels better during the day Patient with a history of snoring, witnessed apneas  About 20 pound weight gain recently Gets about 6 hours of sleep every night Sleep is nonrestorative Occasional headache History of forgetfulness Dad has OSA  Usually goes to bed about 9-10 PM Gets up in the morning about 6 AM  Occupation: Social worker Smoking history: Non-smoker  Outpatient Encounter Medications as of 10/16/2018  Medication Sig  . [DISCONTINUED] escitalopram (LEXAPRO) 10 MG tablet Take 10 mg by mouth daily.   No facility-administered encounter medications on file as of 10/16/2018.     Allergies as of 10/16/2018  . (No Known Allergies)    Past Medical History:  Diagnosis Date  . Anxiety   . Cyst of right ovary   . Endometriosis   . Fibroids   . History of ovarian cyst    12-25-2013  s/p removal left ovarian endometrioma  . History of pelvic inflammatory disease   . History of trichomoniasis   . Sickle cell trait [facility])     Past Surgical History:  Procedure Laterality Date  . CHROMOPERTUBATION  04/24/2015   Procedure: CHROMOPERTUBATION;  Surgeon: Governor Specking, MD;  Location: [facility];  Service: Gynecology;;  . LAPAROSCOPIC LYSIS OF ADHESIONS  04/24/2015   Procedure: LAPAROSCOPIC LYSIS OF ADHESIONS AND EXCISION ENDOMETRIOTIC IMPLANTS;  Surgeon: Governor Specking, MD;  Location: [facility];  Service: Gynecology;;  . LAPAROSCOPIC OVARIAN  CYSTECTOMY Right 04/24/2015   Procedure: LAPAROSCOPIC, RIGHT OVARIAN CYSTECTOMY,  GEL PORT ASSISTED MYOMECTOMY;  Surgeon: Governor Specking, MD;  Location: [facility];  Service: Gynecology;  Laterality: Right;  . LAPAROSCOPY N/A 12/25/2013   Procedure: OPERATIVE LAPAROSCOPY;  Surgeon: Osborne Oman, MD;  Location: [facility];  Service: Gynecology;  Laterality: N/A;  . SALPINGOOPHORECTOMY Left 12/25/2013   Procedure: SALPINGO OOPHORECTOMY;  Surgeon: Osborne Oman, MD;  Location: [facility];  Service: Gynecology;  Laterality: Left;    Family History  Problem Relation Age of Onset  . Aneurysm Mother 27       died of aneurysm, brain  . Cancer Mother 52       breast, twice  . Hypertension Father   . Depression Father   . Heart disease Father        valve disease  . Other Father        DDD  . Other Brother        DDD  . Sarcoidosis Brother   . Cancer Maternal Aunt        breast cancer  . Diabetes Paternal Grandmother   . Stroke Neg Hx     Social History   Socioeconomic History  . Marital status: Single    Spouse name: Not on file  . Number of children: Not on file  . Years of education: Not on file  . Highest education level: Not on file  Occupational History  . Not on file  Social Needs  . Financial resource strain:  Not on file  . Food insecurity:    Worry: Not on file    Inability: Not on file  . Transportation needs:    Medical: Not on file    Non-medical: Not on file  Tobacco Use  . Smoking status: Never Smoker  . Smokeless tobacco: Never Used  Substance and Sexual Activity  . Alcohol use: Yes    Comment: occasionally/socially  . Drug use: No  . Sexual activity: Yes    Birth control/protection: Condom  Lifestyle  . Physical activity:    Days per week: Not on file    Minutes per session: Not on file  . Stress: Not on file  Relationships  . Social connections:    Talks on phone: Not on file    Gets together: Not on file    Attends religious  service: Not on file    Active member of club or organization: Not on file    Attends meetings of clubs or organizations: Not on file    Relationship status: Not on file  . Intimate partner violence:    Fear of current or ex partner: Not on file    Emotionally abused: Not on file    Physically abused: Not on file    Forced sexual activity: Not on file  Other Topics Concern  . 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   HENT: Negative.   Eyes: Negative.   Respiratory: Positive for apnea.   Cardiovascular: Negative.   Gastrointestinal: Negative.   Endocrine: Negative.   Genitourinary: Positive for dyspareunia.  Psychiatric/Behavioral: Positive for sleep disturbance.    Vitals:   10/16/18 1627  BP: 126/82  Pulse: 72  SpO2: 97%    Physical Exam  Constitutional: She appears well-developed and well-nourished.  HENT:  Head: Normocephalic and atraumatic.  Mallampati 4  Eyes: Pupils are equal, round, and reactive to light. Conjunctivae are normal. Right eye exhibits no discharge.  Neck: Normal range of motion. Neck supple. No tracheal deviation present. No thyromegaly present.  Cardiovascular: Normal rate and regular rhythm.  Pulmonary/Chest: Effort normal and breath sounds normal. No respiratory distress.  Abdominal: Soft. Bowel sounds are normal. She exhibits no distension. There is no abdominal tenderness.  Psychiatric: She has a normal mood and affect.   Data Reviewed: Records reviewed Compliance data reveals 60% use, residual AHI of 2.4  Assessment:  Obstructive sleep apnea on CPAP therapy Sleep onset insomnia She is trying to adjust to CPAP use  Nonrestorative sleep-feels better when she is able to keep the CPAP  on  History of endometriosis  Plan/Recommendations:  Pathophysiology of sleep disordered breathing discussed Treatment options of sleep disordered breathing discussed  Encouraged to continue using CPAP therapy  We will prescribe Lunesta for insomnia  I will see you back in the office about 3 months  Sherrilyn Rist MD [facility] 10/16/2018, 4:31 PM  CC: Medicine, [employer]*

## 2018-10-30 ENCOUNTER — Ambulatory Visit: Payer: 59 | Admitting: Family Medicine

## 2018-10-30 ENCOUNTER — Other Ambulatory Visit (HOSPITAL_COMMUNITY)
Admission: RE | Admit: 2018-10-30 | Discharge: 2018-10-30 | Disposition: A | Discharge status: Home / Self Care | Payer: 59 | Source: Ambulatory Visit | Attending: Family Medicine | Admitting: Family Medicine

## 2018-10-30 ENCOUNTER — Encounter: Payer: Self-pay | Admitting: Family Medicine

## 2018-10-30 VITALS — BP 120/84 | HR 82 | Temp 98.9°F | Ht 66.0 in | Wt 249.0 lb

## 2018-10-30 DIAGNOSIS — R102 Pelvic and perineal pain: Secondary | ICD-10-CM

## 2018-10-30 DIAGNOSIS — N809 Endometriosis, unspecified: Secondary | ICD-10-CM | POA: Diagnosis not present

## 2018-10-30 DIAGNOSIS — Z8659 Personal history of other mental and behavioral disorders: Secondary | ICD-10-CM

## 2018-10-30 DIAGNOSIS — Z7689 Persons encountering health services in other specified circumstances: Secondary | ICD-10-CM

## 2018-10-30 DIAGNOSIS — Z113 Encounter for screening for infections with a predominantly sexual mode of transmission: Secondary | ICD-10-CM | POA: Diagnosis not present

## 2018-10-30 LAB — POCT URINALYSIS DIPSTICK
Bilirubin, UA: NEGATIVE
Glucose, UA: NEGATIVE
Ketones, UA: NEGATIVE
Leukocytes, UA: NEGATIVE
Nitrite, UA: NEGATIVE
Protein, UA: NEGATIVE
Spec Grav, UA: 1.015 (ref 1.010–1.025)
Urobilinogen, UA: 0.2 E.U./dL
pH, UA: 7 (ref 5.0–8.0)

## 2018-10-30 NOTE — Patient Instructions (Addendum)
You should schedule a follow up appointment in the next few wks for travel vaccine information.  A pelvic ultrasound has been ordered for you.  You should receive a phone call about having this scheduled.    In the meantime, please call your OB/Gyn office to schedule a sooner follow up appointment. Endometriosis  Endometriosis is a condition in which the tissue that lines the uterus (endometrium) grows outside of its normal location. The tissue may grow in many locations close to the uterus, but it commonly grows on the ovaries, fallopian tubes, vagina, or bowel. When the uterus sheds the endometrium every menstrual cycle, there is bleeding wherever the endometrial tissue is located. This can cause pain because blood is irritating to tissues that are not normally exposed to it. What are the causes? The cause of endometriosis is not known. What increases the risk? You may be more likely to develop endometriosis if you:  Have a family history of endometriosis.  Have never given birth.  Started your period at age 30 or younger.  Have high levels of estrogen in your body.  Were exposed to a certain medicine (diethylstilbestrol) before you were born (in utero).  Had low birth weight.  Were born as a twin, triplet, or other multiple.  Have a BMI of less than 25. BMI is an estimate of body fat and is calculated from height and weight. What are the signs or symptoms? Often, there are no symptoms of this condition. If you do have symptoms, they may:  Vary depending on where your endometrial tissue is growing.  Occur during your menstrual period (most common) or midcycle.  Come and go, or you may go months with no symptoms at all.  Stop with menopause. Symptoms may include:  Pain in the back or abdomen.  Heavier bleeding during periods.  Pain during sex.  Painful bowel movements.  Infertility.  Pelvic pain.  Bleeding more than once a month. How is this diagnosed? This  condition is diagnosed based on your symptoms and a physical exam. You may have tests, such as:  Blood tests and urine tests. These may be done to help rule out other possible causes of your symptoms.  Ultrasound, to look for abnormal tissues.  An X-ray of the lower bowel (barium enema).  An ultrasound that is done through the vagina (transvaginally).  CT scan.  MRI.  Laparoscopy. In this procedure, a lighted, pencil-sized instrument called a laparoscope is inserted into your abdomen through an incision. The laparoscope allows your health care provider to look at the organs inside your body and check for abnormal tissue to confirm the diagnosis. If abnormal tissue is found, your health care provider may remove a small piece of tissue (biopsy) to be examined under a microscope. How is this treated? Treatment for this condition may include:  Medicines to relieve pain, such as NSAIDs.  Hormone therapy. This involves using artificial (synthetic) hormones to reduce endometrial tissue growth. Your health care provider may recommend using a hormonal form of birth control, or other medicines.  Surgery. This may be done to remove abnormal endometrial tissue. ? In some cases, tissue may be removed using a laparoscope and a laser (laparoscopic laser treatment). ? In severe cases, surgery may be done to remove the fallopian tubes, uterus, and ovaries (hysterectomy). Follow these instructions at home:  Take over-the-counter and prescription medicines only as told by your health care provider.  Do not drive or use heavy machinery while taking prescription pain medicine.  Try to avoid activities that cause pain, including sexual activity.  Keep all follow-up visits as told by your health care provider. This is important. Contact a health care provider if:  You have pain in the area between your hip bones (pelvic area) that occurs: ? Before, during, or after your period. ? In between your  period and gets worse during your period. ? During or after sex. ? With bowel movements or urination, especially during your period.  You have problems getting pregnant.  You have a fever. Get help right away if:  You have severe pain that does not get better with medicine.  You have severe nausea and vomiting, or you cannot eat without vomiting.  You have pain that affects only the lower, right side of your abdomen.  You have abdominal pain that gets worse.  You have abdominal swelling.  You have blood in your stool. This information is not intended to replace advice given to you by your health care provider. Make sure you discuss any questions you have with your health care provider. Document Released: 09/16/2000 Document Revised: 06/24/2016 Document Reviewed: 02/20/2016 Elsevier Interactive Patient Education  2019 [employer].  Pelvic Pain, Female Pelvic pain is pain in your lower abdomen, below your belly button and between your hips. The pain may start suddenly (be acute), keep coming back (be recurring), or last a long time (become chronic). Pelvic pain that lasts longer than 6 months is considered chronic. Pelvic pain may affect your:  Reproductive organs.  Urinary system.  Digestive tract.  Musculoskeletal system. There are many potential causes of pelvic pain. Sometimes, the pain can be a result of digestive or urinary conditions, strained muscles or ligaments, or reproductive conditions. Sometimes the cause of pelvic pain is not known. Follow these instructions at home:   Take over-the-counter and prescription medicines only as told by your health care provider.  Rest as told by your health care provider.  Do not have sex if it hurts.  Keep a journal of your pelvic pain. Write down: ? When the pain started. ? Where the pain is located. ? What seems to make the pain better or worse, such as food or your period (menstrual cycle). ? Any symptoms you have  along with the pain.  Keep all follow-up visits as told by your health care provider. This is important. Contact a health care provider if:  Medicine does not help your pain.  Your pain comes back.  You have new symptoms.  You have abnormal vaginal discharge or bleeding, including bleeding after menopause.  You have a fever or chills.  You are constipated.  You have blood in your urine or stool.  You have foul-smelling urine.  You feel weak or light-headed. Get help right away if:  You have sudden severe pain.  Your pain gets steadily worse.  You have severe pain along with fever, nausea, vomiting, or excessive sweating.  You lose consciousness. Summary  Pelvic pain is pain in your lower abdomen, below your belly button and between your hips.  There are many potential causes of pelvic pain.  Keep a journal of your pelvic pain. This information is not intended to replace advice given to you by your health care provider. Make sure you discuss any questions you have with your health care provider. Document Released: 08/16/2004 Document Revised: 03/07/2018 Document Reviewed: 03/07/2018 Elsevier Interactive Patient Education  2019 [employer].

## 2018-11-01 LAB — CERVICOVAGINAL ANCILLARY ONLY
Bacterial vaginitis: NEGATIVE
Candida vaginitis: POSITIVE — AB
Chlamydia: NEGATIVE
Neisseria Gonorrhea: NEGATIVE
Trichomonas: NEGATIVE

## 2018-11-02 ENCOUNTER — Encounter: Payer: Self-pay | Admitting: Family Medicine

## 2018-11-02 DIAGNOSIS — Z8659 Personal history of other mental and behavioral disorders: Secondary | ICD-10-CM | POA: Insufficient documentation

## 2018-11-02 MED ORDER — FLUCONAZOLE 150 MG PO TABS: 150.0000 mg | ORAL_TABLET | Freq: Once | ORAL | 0 refills | Status: AC

## 2018-11-02 NOTE — Progress Notes (Signed)
Patient presents to clinic today to establish care.  SUBJECTIVE: PMH: Patient is a 33 year old female with past medical history significant for endometriosis and depression.  Pt was seen at [facility] . Pt is followed by OB/Gyn.    H/o depression and Anxiety: -symptoms x yrs -went to counseling in the past. -states mood is ok, sleep is not the best, and energy is ok -was on as escitalopram -currently taking OTC lithium 5 mg daily  Gyn issues: -h/o endometriosis dx'd by ex lap -was being seen by Reproductive Endocrinologist in [facility] -pt was able to become pregnant naturally, but notes miscarriage in March 2019 -h/o ovarian cyst -currently having R side pain, pain and bleeding with sex. -pain noted as a throbbing sensation, 8/10 -tried aleeve. -states has a gyn appt in March. -menses started being regular in August.  Allergies: NKDA  Past Surgical hx: Left oophorectomy Ex lap for endometriosis Endometrial lesion Cyst on right ovary Myomectomy  Social Hx: Pt is single.  She is employed at [employer] as a Education officer, museum.  Pt endorses social alcohol use.  Patient denies tobacco and drug use.  Family medical history: Mom-deceased, breast cancer, depression Dad-alive, alcohol abuse, depression, diabetes, heart disease, HLD, mental illness Sister- mental illness  Health Maintenance: Dental --Gerlene Burdock and [facility] PAP -- 2018? LMP--10/28/2018 Mammogram--2018  Past Medical History:  Diagnosis Date  . Anxiety   . Cyst of right ovary   . Endometriosis   . Fibroids   . History of ovarian cyst    12-25-2013  s/p removal left ovarian endometrioma  . History of pelvic inflammatory disease   . History of trichomoniasis   . Sickle cell trait ([place])   . UTI (urinary tract infection)     Past Surgical History:  Procedure Laterality Date  . CHROMOPERTUBATION  04/24/2015   Procedure: CHROMOPERTUBATION;  Surgeon: Governor Specking, MD;  Location:  [facility];  Service: Gynecology;;  . LAPAROSCOPIC LYSIS OF ADHESIONS  04/24/2015   Procedure: LAPAROSCOPIC LYSIS OF ADHESIONS AND EXCISION ENDOMETRIOTIC IMPLANTS;  Surgeon: Governor Specking, MD;  Location: [place];  Service: Gynecology;;  . LAPAROSCOPIC OVARIAN CYSTECTOMY Right 04/24/2015   Procedure: LAPAROSCOPIC, RIGHT OVARIAN CYSTECTOMY,  GEL PORT ASSISTED MYOMECTOMY;  Surgeon: Governor Specking, MD;  Location: [place];  Service: Gynecology;  Laterality: Right;  . LAPAROSCOPY N/A 12/25/2013   Procedure: OPERATIVE LAPAROSCOPY;  Surgeon: Osborne Oman, MD;  Location: [facility];  Service: Gynecology;  Laterality: N/A;  . SALPINGOOPHORECTOMY Left 12/25/2013   Procedure: SALPINGO OOPHORECTOMY;  Surgeon: Osborne Oman, MD;  Location: [facility];  Service: Gynecology;  Laterality: Left;    Current Outpatient Medications on File Prior to Visit  Medication Sig Dispense Refill  . eszopiclone (LUNESTA) 2 MG TABS tablet Take 1 tablet (2 mg total) by mouth at bedtime as needed for sleep. Take immediately before bedtime 30 tablet 2   No current facility-administered medications on file prior to visit.     No Known Allergies  Family History  Problem Relation Age of Onset  . Aneurysm Mother 77       died of aneurysm, brain  . Cancer Mother 50       breast, twice  . Hypertension Father   . Depression Father   . Heart disease Father        valve disease  . Other Father        DDD  . Other Brother  DDD  . Sarcoidosis Brother   . Cancer Maternal Aunt        breast cancer  . Diabetes Paternal Grandmother   . Stroke Neg Hx     Social History   Socioeconomic History  . Marital status: Single    Spouse name: Not on file  . Number of children: Not on file  . Years of education: Not on file  . Highest education level: Not on file  Occupational History  . Not on file  Social Needs  . Financial resource strain: Not on file  . Food  insecurity:    Worry: Not on file    Inability: Not on file  . Transportation needs:    Medical: Not on file    Non-medical: Not on file  Tobacco Use  . Smoking status: Never Smoker  . Smokeless tobacco: Never Used  Substance and Sexual Activity  . Alcohol use: Yes    Comment: occasionally/socially  . Drug use: No  . Sexual activity: Yes    Birth control/protection: Condom  Lifestyle  . Physical activity:    Days per week: Not on file    Minutes per session: Not on file  . Stress: Not on file  Relationships  . Social connections:    Talks on phone: Not on file    Gets together: Not on file    Attends religious service: Not on file    Active member of club or organization: Not on file    Attends meetings of clubs or organizations: Not on file    Relationship status: Not on file  . Intimate partner violence:    Fear of current or ex partner: Not on file    Emotionally abused: Not on file    Physically abused: Not on file    Forced sexual activity: Not on file  Other Topics Concern  . Not on file  Social History Narrative   [employer], lives alone, no children, Education officer, museum at [employer], exercise - 3-4 times per week, walks 1-2 miles, weights at [employer], other cardio, diet is good, improved from 2014    ROS General: Denies fever, chills, night sweats, changes in weight, changes in appetite HEENT: Denies headaches, ear pain, changes in vision, rhinorrhea, sore throat CV: Denies CP, palpitations, SOB, orthopnea Pulm: Denies SOB, cough, wheezing GI: Denies abdominal pain, nausea, vomiting, diarrhea, constipation GU: Denies dysuria, hematuria, frequency, vaginal d/c.  +pelvic pain, bleeding Msk: Denies muscle cramps, joint pains Neuro: Denies weakness, numbness, tingling Skin: Denies rashes, bruising Psych: Denies depression, anxiety, hallucinations  BP 120/84 (BP Location: Left Arm, Patient Position: Sitting, Cuff Size: Large)   Pulse 82   Temp 98.9  F (37.2 C) (Oral)   Ht 5\' 6"  (1.676 m)   Wt 249 lb (112.9 kg)   LMP 10/28/2018 (Exact Date)   SpO2 99%   BMI 40.19 kg/m   Physical Exam Gen. Pleasant, well developed, well-nourished, in NAD HEENT - [place]/AT, PERRL, no scleral icterus, no nasal drainage, pharynx without erythema or exudate. Lungs: no use of accessory muscles, CTAB, no wheezes, rales or rhonchi Cardiovascular: RRR, No r/g/m, no peripheral edema Abdomen: BS present, soft, TTP in RLQ and LLQ ,nondistended  Neuro:  A&Ox3, CN II-XII intact, normal gait Skin:  Warm, dry, intact, no lesions  Recent Results (from the past 2160 hour(s))  Cervicovaginal ancillary only     Status: Abnormal   Collection Time: 10/30/18 12:00 AM  Result Value Ref Range   Bacterial vaginitis  Negative for Bacterial Vaginitis Microorganisms     Comment: Normal Reference Range - Negative   Candida vaginitis **POSITIVE for Candida species** (A)     Comment: Normal Reference Range - Negative   Chlamydia Negative     Comment: Normal Reference Range - Negative   Neisseria gonorrhea Negative     Comment: Normal Reference Range - Negative   Trichomonas Negative     Comment: Normal Reference Range - Negative  POC Urinalysis Dipstick     Status: None   Collection Time: 10/30/18  4:47 PM  Result Value Ref Range   Color, UA yellow    Clarity, UA clear    Glucose, UA Negative Negative   Bilirubin, UA neg    Ketones, UA neg    Spec Grav, UA 1.015 1.010 - 1.025   Blood, UA 1+     Comment: pt on menstrual cycle   pH, UA 7.0 5.0 - 8.0   Protein, UA Negative Negative   Urobilinogen, UA 0.2 0.2 or 1.0 E.U./dL   Nitrite, UA neg    Leukocytes, UA Negative Negative   Appearance     Odor      Assessment/Plan: Endometriosis  - Plan: US Pelvic Complete With Transvaginal, POC Urinalysis Dipstick  Pelvic pain  -Given history of endometriosis and ovarian cyst will proceed with pelvic ultrasound -advised strongly to contact her OB/GYN for sooner  appointment. - Plan: US Pelvic Complete With Transvaginal, POC Urinalysis Dipstick, CANCELED: POCT Urinalysis Dipstick (Automated)  Routine screening for STI (sexually transmitted infection)  - Plan: RPR, HIV antibody (with reflex), POC Urinalysis Dipstick, Cervicovaginal ancillary   History of depression -Patient encouraged to stop taking OTC lithium 5 mg given possible interaction with multiple medications -Patient encouraged to consider therapy/counseling services -Discussed obtaining TSH, free T4  Encounter to establish care -We reviewed the PMH, PSH, FH, SH, Meds and Allergies. -We provided refills for any medications we will prescribe as needed. -We addressed current concerns per orders and patient instructions. -We have asked for records for pertinent exams, studies, vaccines and notes from previous providers. -We have advised patient to follow up per instructions below.  Follow-up PRN  Grier Mitts, MD

## 2018-11-06 ENCOUNTER — Ambulatory Visit
Admission: RE | Admit: 2018-11-06 | Discharge: 2018-11-06 | Disposition: A | Discharge status: Home / Self Care | Payer: 59 | Source: Ambulatory Visit | Attending: Family Medicine | Admitting: Family Medicine

## 2018-11-06 ENCOUNTER — Other Ambulatory Visit: Payer: 59

## 2018-11-06 DIAGNOSIS — D251 Intramural leiomyoma of uterus: Secondary | ICD-10-CM | POA: Diagnosis not present

## 2018-11-06 DIAGNOSIS — R102 Pelvic and perineal pain: Secondary | ICD-10-CM

## 2018-11-06 DIAGNOSIS — N809 Endometriosis, unspecified: Secondary | ICD-10-CM

## 2018-11-06 DIAGNOSIS — D252 Subserosal leiomyoma of uterus: Secondary | ICD-10-CM | POA: Diagnosis not present

## 2018-11-15 DIAGNOSIS — D251 Intramural leiomyoma of uterus: Secondary | ICD-10-CM | POA: Diagnosis not present

## 2018-11-15 DIAGNOSIS — N83291 Other ovarian cyst, right side: Secondary | ICD-10-CM | POA: Diagnosis not present

## 2018-11-15 DIAGNOSIS — N83202 Unspecified ovarian cyst, left side: Secondary | ICD-10-CM | POA: Diagnosis not present

## 2018-11-15 DIAGNOSIS — N801 Endometriosis of ovary: Secondary | ICD-10-CM | POA: Diagnosis not present

## 2018-11-19 DIAGNOSIS — R0982 Postnasal drip: Secondary | ICD-10-CM | POA: Diagnosis not present

## 2018-11-19 DIAGNOSIS — J0110 Acute frontal sinusitis, unspecified: Secondary | ICD-10-CM | POA: Diagnosis not present

## 2018-11-19 DIAGNOSIS — R05 Cough: Secondary | ICD-10-CM | POA: Diagnosis not present

## 2018-11-28 ENCOUNTER — Encounter: Payer: Self-pay | Admitting: Obstetrics & Gynecology

## 2018-11-28 ENCOUNTER — Ambulatory Visit (INDEPENDENT_AMBULATORY_CARE_PROVIDER_SITE_OTHER): Payer: 59 | Admitting: Obstetrics & Gynecology

## 2018-11-28 VITALS — BP 123/90 | HR 65 | Wt 241.0 lb

## 2018-11-28 DIAGNOSIS — R102 Pelvic and perineal pain: Secondary | ICD-10-CM

## 2018-11-28 DIAGNOSIS — N941 Unspecified dyspareunia: Secondary | ICD-10-CM

## 2018-11-28 DIAGNOSIS — N80129 Deep endometriosis of ovary, unspecified ovary: Secondary | ICD-10-CM

## 2018-11-28 DIAGNOSIS — G8929 Other chronic pain: Secondary | ICD-10-CM

## 2018-11-28 DIAGNOSIS — N809 Endometriosis, unspecified: Secondary | ICD-10-CM | POA: Diagnosis not present

## 2018-11-28 MED ORDER — NAPROXEN 500 MG PO TABS: 500.0000 mg | ORAL_TABLET | Freq: Two times a day (BID) | ORAL | 2 refills | Status: DC

## 2018-11-28 MED ORDER — ELAGOLIX SODIUM 200 MG PO TABS: 200.0000 mg | ORAL_TABLET | Freq: Two times a day (BID) | ORAL | 3 refills | Status: DC

## 2018-11-28 NOTE — Patient Instructions (Signed)
Elagolix tablets What is this medicine? ELAGOLIX (el a GOE lix) is used to treat endometriosis in women. It reduces pain from the condition and may help reduce painful sexual intercourse. This medicine may be used for other purposes; ask your health care provider or pharmacist if you have questions. COMMON BRAND NAME(S): [name] What should I tell my health care provider before I take this medicine? They need to know if you have any of these conditions: -liver disease -mental illness -osteoporosis -suicidal thoughts, plans, or attempt; a previous suicide attempt by you or a family member -an unusual or allergic reaction to elagolix, other medicines, foods, dyes, or preservatives -pregnant or trying to get pregnant -breast-feeding How should I use this medicine? Take this medicine by mouth with a glass of water. You can take it with or without food. Follow the directions on the prescription label. Take this medicine at the same time each day. Do not take your medicine more often than directed. A special MedGuide will be given to you by the pharmacist with each prescription and refill. Be sure to read this information carefully each time. Talk to your pediatrician regarding the use of this medicine in children. This medicine is not approved for use in children. Overdosage: If you think you have taken too much of this medicine contact a poison control center or emergency room at once. NOTE: This medicine is only for you. Do not share this medicine with others. What if I miss a dose? If you miss a dose, take it as soon as you can. If it is almost time for your next dose, take only that dose. Do not take double or extra doses. What may interact with this medicine? Do not take this medicine with any of the following medications: -cyclosporine -gemfibrozil This medicine may also interact with the following medications: -certain antiviral medicines for hepatitis, HIV or  AIDS -citalopram -digoxin -female hormones, like estrogens or progestins and birth control pills, patches, rings, or injections -methadone -midazolam -omeprazole -rifampin -rosuvastatin This list may not describe all possible interactions. Give your health care provider a list of all the medicines, herbs, non-prescription drugs, or dietary supplements you use. Also tell them if you smoke, drink alcohol, or use illegal drugs. Some items may interact with your medicine. What should I watch for while using this medicine? Visit your doctor or health care professional for regular checks on your progress. This medicine may cause weak bones (osteoporosis). Only use this product for the amount of time your health care professional tells you to. The longer you use this product the more likely you will be at risk for weak bones. Ask your health care professional how you can keep strong bones. You may have a change in bleeding pattern or irregular periods. Many females stop having periods while taking this drug. This medicine does not prevent pregnancy. Women must use effective birth control with this medicine. Use a non-hormonal form of birth control while taking this medicine and for 1 week after stopping it. Talk to your health care professional about how to prevent pregnancy. Do not become pregnant while taking this medicine. Women should inform their doctor if they wish to become pregnant or think they might be pregnant. There is a potential for serious side effects to an unborn child. Talk to your health care professional or pharmacist for more information. Patients and their families should watch out for new or worsening depression or thoughts of suicide. Also watch out for sudden changes in feelings  such as feeling anxious, agitated, panicky, irritable, hostile, aggressive, impulsive, severely restless, overly excited and hyperactive, or not being able to sleep. If this happens, call your health care  professional. What side effects may I notice from receiving this medicine? Side effects that you should report to your doctor or health care professional as soon as possible: -allergic reactions like skin rash, itching or hives, swelling of the face, lips, or tongue -anxious -depressed mood -signs and symptoms of liver injury like dark yellow or brown urine; general ill feeling or flu-like symptoms; light-colored stools; loss of appetite; nausea; right upper belly pain; unusually weak or tired; yellowing of the eyes or skin -suicidal thoughts or other mood changes Side effects that usually do not require medical attention (report these to your doctor or health care professional if they continue or are bothersome): -reduced or absent menstrual periods -headache -hot flashes or night sweats -nausea -joint pain -trouble sleeping This list may not describe all possible side effects. Call your doctor for medical advice about side effects. You may report side effects to FDA at 1-800-FDA-1088. Where should I keep my medicine? Keep out of the reach of children. Store at room temperature between 2 and 30 degrees C (36 and 86 degrees F). Throw away any unused medicine after the expiration date on the label. Discard any unused medicine and used packaging carefully. Follow the directions in the [place]. Do NOT flush down the toilet. NOTE: This sheet is a summary. It may not cover all possible information. If you have questions about this medicine, talk to your doctor, pharmacist, or health care provider.  2019 Elsevier/Gold Standard (2018-06-05 12:46:01)

## 2018-11-28 NOTE — Progress Notes (Signed)
GYNECOLOGY OFFICE VISIT NOTE    History:  Frances Hill is a 33 y.o. G1P0010 here today for report of episodic left sided moderate to severe pain and enlarging left ovarian endometrioma on recent ultrasound.  She also reports dyspareunia. The pain is not adequately treated by NSAIDs; she has also tried Aygestin and tramadol in past which did not help much. She has undergone surgeries for her endometriosis, does not want surgery now unless there is no other option. She denies any current pain, abnormal vaginal discharge or other concerns.    Past Medical History:  Diagnosis Date  . Anxiety   . Cyst of right ovary   . Endometriosis   . Fibroids   . History of ovarian cyst    12-25-2013  s/p removal left ovarian endometrioma  . History of pelvic inflammatory disease   . History of trichomoniasis   . Sickle cell trait ([place])   . UTI (urinary tract infection)     Past Surgical History:  Procedure Laterality Date  . CHROMOPERTUBATION  04/24/2015   Procedure: CHROMOPERTUBATION;  Surgeon: Governor Specking, MD;  Location: [facility];  Service: Gynecology;;  . LAPAROSCOPIC LYSIS OF ADHESIONS  04/24/2015   Procedure: LAPAROSCOPIC LYSIS OF ADHESIONS AND EXCISION ENDOMETRIOTIC IMPLANTS;  Surgeon: Governor Specking, MD;  Location: [place];  Service: Gynecology;;  . LAPAROSCOPIC OVARIAN CYSTECTOMY Right 04/24/2015   Procedure: LAPAROSCOPIC, RIGHT OVARIAN CYSTECTOMY,  GEL PORT ASSISTED MYOMECTOMY;  Surgeon: Governor Specking, MD;  Location: [place];  Service: Gynecology;  Laterality: Right;  . LAPAROSCOPY N/A 12/25/2013   Procedure: OPERATIVE LAPAROSCOPY;  Surgeon: Osborne Oman, MD;  Location: [facility];  Service: Gynecology;  Laterality: N/A;  . SALPINGOOPHORECTOMY Left 12/25/2013   Procedure: SALPINGO OOPHORECTOMY;  Surgeon: Osborne Oman, MD;  Location: [facility];  Service: Gynecology;  Laterality: Left;    The following portions of the  patient's history were reviewed and updated as appropriate: allergies, current medications, past family history, past medical history, past social history, past surgical history and problem list.   Health Maintenance:  Normal pap and negative HRHPV on 10/27/2016.    Review of Systems:  Pertinent items noted in HPI and remainder of comprehensive ROS otherwise negative.  Physical Exam:  BP 123/90   Pulse 65   Wt 241 lb (109.3 kg)   BMI 38.90 kg/m  CONSTITUTIONAL: Well-developed, well-nourished female in no acute distress.  HEENT:  Normocephalic, atraumatic. External right and left ear normal. No scleral icterus.  NECK: Normal range of motion, supple, no masses noted on observation SKIN: No rash noted. Not diaphoretic. No erythema. No pallor. MUSCULOSKELETAL: Normal range of motion. No edema noted. NEUROLOGIC: Alert and oriented to person, place, and time. Normal muscle tone coordination. No cranial nerve deficit noted. PSYCHIATRIC: Normal mood and affect. Normal behavior. Normal judgment and thought content. CARDIOVASCULAR: Normal heart rate noted RESPIRATORY: Effort and breath sounds normal, no problems with respiration noted ABDOMEN: No masses noted. No other overt distention noted.   PELVIC: Deferred  Labs and Imaging No results found for this or any previous visit (from the past 168 hour(s)). US Pelvic Complete With Transvaginal  Result Date: 11/06/2018 CLINICAL DATA:  Pelvic pain and bleeding with coitus, endometriosis EXAM: TRANSABDOMINAL AND TRANSVAGINAL ULTRASOUND OF PELVIS TECHNIQUE: Both transabdominal and transvaginal ultrasound examinations of the pelvis were performed. Transabdominal technique was performed for global imaging of the pelvis including uterus, ovaries, adnexal regions, and pelvic cul-de-sac. It was necessary to proceed with  endovaginal exam following the transabdominal exam to visualize the endometrium. COMPARISON:  12/22/2017 FINDINGS: Uterus Measurements: 10.8 x  5.0 x 5.3 cm = volume: 138 mL. Small intramural leiomyomata at upper uterine segment 2.6 x 2.1 x 2.0 cm and 1.4 x 1.8 x 2.0 cm. Additional small posterior subserosal/exophytic leiomyoma at the mid uterus 5.4 x 4.4 x 5.4 cm. Endometrium Thickness: 13 mm.  No endometrial fluid or focal abnormality Right ovary Measurements: 4.6 x 6.1 x 6.0 cm = volume: 82 mL. Hypoechoic nodule with homogeneous low-level echogenicity throughout consistent with endometrioma, 4.4 x 5.2 x 4.3 cm in size (previously this measured 3.9 x 3.7 x 3.2 cm) and lacking internal blood flow on color Doppler imaging. Left ovary Surgically absent Other findings No free pelvic fluid.  No other adnexal masses. IMPRESSION: Multiple uterine leiomyomata. Prior LEFT oophorectomy. Interval increase in size of a RIGHT ovarian mass now 34.4 x 5.2 x 4.3 cm in size with homogeneous low level echogenicity throughout consistent with endometrioma. Electronically Signed   By: Lavonia Dana M.D.   On: 11/06/2018 17:32    Assessment and Plan:      1. Chronic pelvic pain in female 2. Endometriosis 3. Endometrioma 4. Dyspareunia in female Reviewed ultrasound findings, reassured patient that the cyst increased by 1 cm. She wants to avoid surgery for now. Given her symptoms that have not been effectively treated on hormonal therapy, NSAIDs and Tramadol in past, and desire to avoid surgery for now, recommended Orilissa.  Talked to the representative for the company that produces this medication, he feels she will need prior authorization from [place]. Once received from the pharmacy, we will fill it out stating that she needs this as she has tried other recommended therapies without much effect. Will monitor response to this medication. Side effects of [name] shared with patient. Naproxen prescribed to be used at the same time as needed for pain. If symptoms worsen/persist, patient was told she will be referred back to Dr. Kerin Perna for any further surgical  management. - naproxen (NAPROSYN) 500 MG tablet; Take 1 tablet (500 mg total) by mouth 2 (two) times daily with a meal. As needed for pain  Dispense: 60 tablet; Refill: 2 - Elagolix Sodium (ORILISSA) 200 MG TABS; Take 200 mg by mouth 2 (two) times daily.  Dispense: 60 tablet; Refill: 3  Routine preventative health maintenance measures emphasized. Please refer to After Visit Summary for other counseling recommendations.   Return in about 1 month (around 12/27/2018) for Followup endometriosis.    Total face-to-face time with patient: 25 minutes.  Over 50% of encounter was spent on counseling and coordination of care.   Verita Schneiders, MD, [place] for [employer], [place]

## 2018-11-28 NOTE — Addendum Note (Signed)
Addended by: Verita Schneiders A on: 11/28/2018 12:52 PM   Modules accepted: Level of Service

## 2018-11-29 NOTE — Progress Notes (Signed)
Patient was called at home today, 11/29/2018, and told to pick up two week sample of Orlissa 200 mg tablets and a patient savings card. She will come tomorrow morning.  Verita Schneiders, MD, [place] for [employer], [place]

## 2018-12-04 DIAGNOSIS — B850 Pediculosis due to Pediculus humanus capitis: Secondary | ICD-10-CM | POA: Diagnosis not present

## 2018-12-15 DIAGNOSIS — G4733 Obstructive sleep apnea (adult) (pediatric): Secondary | ICD-10-CM | POA: Diagnosis not present

## 2019-01-15 DIAGNOSIS — G4733 Obstructive sleep apnea (adult) (pediatric): Secondary | ICD-10-CM | POA: Diagnosis not present

## 2019-02-13 ENCOUNTER — Ambulatory Visit (HOSPITAL_COMMUNITY)
Admission: EM | Admit: 2019-02-13 | Discharge: 2019-02-13 | Disposition: A | Discharge status: Home / Self Care | Payer: 59 | Attending: Family Medicine | Admitting: Family Medicine

## 2019-02-13 ENCOUNTER — Ambulatory Visit (INDEPENDENT_AMBULATORY_CARE_PROVIDER_SITE_OTHER): Payer: 59

## 2019-02-13 ENCOUNTER — Other Ambulatory Visit: Payer: Self-pay

## 2019-02-13 ENCOUNTER — Encounter (HOSPITAL_COMMUNITY): Payer: Self-pay | Admitting: Emergency Medicine

## 2019-02-13 DIAGNOSIS — R059 Cough, unspecified: Secondary | ICD-10-CM

## 2019-02-13 DIAGNOSIS — R05 Cough: Secondary | ICD-10-CM

## 2019-02-13 MED ORDER — OMEPRAZOLE 20 MG PO CPDR: 20.0000 mg | DELAYED_RELEASE_CAPSULE | Freq: Every day | ORAL | 0 refills | Status: DC

## 2019-02-13 MED ORDER — ALBUTEROL SULFATE HFA 108 (90 BASE) MCG/ACT IN AERS: 1.0000 | INHALATION_SPRAY | Freq: Four times a day (QID) | RESPIRATORY_TRACT | 0 refills | Status: DC | PRN

## 2019-02-13 MED ORDER — BENZONATATE 100 MG PO CAPS: 200.0000 mg | ORAL_CAPSULE | Freq: Three times a day (TID) | ORAL | 0 refills | Status: DC

## 2019-02-13 NOTE — Discharge Instructions (Addendum)
Your chest xray is reassuring.  I have sent a medication you may use as needed for cough.  Daily omeprazole may also help, as sometimes reflux symptoms can cause similar symptoms.  Use of inhaler as needed for wheezing or shortness of breath.   It is reassuring you do not have a fever.  However, due to presence of covid-19 we still do recommend self isolation for the next 7 days.  If develop worsening of chest pain , fevers, difficulty breathing or otherwise worsening please go to the ER.

## 2019-02-13 NOTE — ED Provider Notes (Signed)
[place]    CSN: [id number] Arrival date & time: 02/13/19  1652     History   Chief Complaint Chief Complaint  Patient presents with  . Cough    HPI Frances Hill is a 33 y.o. female.   Maressa R Polito chest tightness with chest pain , pinching sensation. Off and on for a few days. Chest pressure. Minimal cough, occasional. Shortness of breath, worse with laying down. Hx of sleep apnea. Laying or with activity feels winded. No asthma history. Activity doesn't necessarily cause cough. Works around others with [employer], in others' homes. No specific ill contacts? There was one person in her office who tested positive for Covid-19 a few weeks ago. Headache. No fevers. Some nasal drainage. Sinus pressure. No ear pain. Diarrhea, no vomiting. Feet swell sometimes. No leg pain or swelling. No travel. No birth control or hormone therapy. No smoking history. History of anxiety but denies any previous similar symptoms, states she is typically able to self- manage.    ROS per HPI, negative if not otherwise mentioned.      Past Medical History:  Diagnosis Date  . Anxiety   . Cyst of right ovary   . Endometriosis   . Fibroids   . History of ovarian cyst    12-25-2013  s/p removal left ovarian endometrioma  . History of pelvic inflammatory disease   . History of trichomoniasis   . Sickle cell trait ([place])   . UTI (urinary tract infection)     Patient Active Problem List   Diagnosis Date Noted  . History of depression 11/02/2018  . Family history of breast cancer 01/30/2018  . Routine general medical examination at a health care facility 10/27/2016  . Vaginal discharge 10/27/2016  . Screen for STD (sexually transmitted disease) 10/27/2016  . Obesity with serious comorbidity 10/27/2016  . Influenza vaccination declined 10/27/2016  . Encounter for surveillance of contraceptive pills 10/27/2016  . Depressed mood 10/27/2016  . PTSD (post-traumatic stress disorder)  10/27/2016  . Screening for cervical cancer 10/27/2016  . Frequent headaches 09/30/2015  . Generalized anxiety disorder 09/30/2015  . Insomnia 09/30/2015  . Chronic pelvic pain in female 09/30/2015  . Female fertility problem 09/30/2015  . Endometriosis of ovary 04/24/2015  . Dyspareunia 04/22/2015  . Cyst of right ovary 04/22/2015  . Endometriosis 01/14/2014  . Ovarian cyst 12/11/2013    Past Surgical History:  Procedure Laterality Date  . CHROMOPERTUBATION  04/24/2015   Procedure: CHROMOPERTUBATION;  Surgeon: Governor Specking, MD;  Location: [facility];  Service: Gynecology;;  . LAPAROSCOPIC LYSIS OF ADHESIONS  04/24/2015   Procedure: LAPAROSCOPIC LYSIS OF ADHESIONS AND EXCISION ENDOMETRIOTIC IMPLANTS;  Surgeon: Governor Specking, MD;  Location: [facility];  Service: Gynecology;;  . LAPAROSCOPIC OVARIAN CYSTECTOMY Right 04/24/2015   Procedure: LAPAROSCOPIC, RIGHT OVARIAN CYSTECTOMY,  GEL PORT ASSISTED MYOMECTOMY;  Surgeon: Governor Specking, MD;  Location: [facility];  Service: Gynecology;  Laterality: Right;  . LAPAROSCOPY N/A 12/25/2013   Procedure: OPERATIVE LAPAROSCOPY;  Surgeon: Osborne Oman, MD;  Location: [facility];  Service: Gynecology;  Laterality: N/A;  . SALPINGOOPHORECTOMY Left 12/25/2013   Procedure: SALPINGO OOPHORECTOMY;  Surgeon: Osborne Oman, MD;  Location: [facility];  Service: Gynecology;  Laterality: Left;    OB History    Gravida  1   Para  0   Term  0   Preterm  0   AB  1   Living  0  SAB  0   TAB  0   Ectopic  1   Multiple  0   Live Births               Home Medications    Prior to Admission medications   Medication Sig Start Date End Date Taking? Authorizing Provider  albuterol (PROAIR HFA) 108 (90 Base) MCG/ACT inhaler Inhale 1-2 puffs into the lungs every 6 (six) hours as needed for wheezing or shortness of breath. 02/13/19   Zigmund Gottron, NP  benzonatate (TESSALON) 100 MG  capsule Take 2 capsules (200 mg total) by mouth every 8 (eight) hours. 02/13/19   Zigmund Gottron, NP  Elagolix Sodium (ORILISSA) 200 MG TABS Take 200 mg by mouth 2 (two) times daily. 11/28/18   Anyanwu, Sallyanne Havers, MD  eszopiclone (LUNESTA) 2 MG TABS tablet Take 1 tablet (2 mg total) by mouth at bedtime as needed for sleep. Take immediately before bedtime 10/16/18   Sherrilyn Rist A, MD  naproxen (NAPROSYN) 500 MG tablet Take 1 tablet (500 mg total) by mouth 2 (two) times daily with a meal. As needed for pain 11/28/18   Anyanwu, Ugonna A, MD  omeprazole (PRILOSEC) 20 MG capsule Take 1 capsule (20 mg total) by mouth daily. 02/13/19   Zigmund Gottron, NP    Family History Family History  Problem Relation Age of Onset  . Aneurysm Mother 69       died of aneurysm, brain  . Cancer Mother 10       breast, twice  . Hypertension Father   . Depression Father   . Heart disease Father        valve disease  . Other Father        DDD  . Other Brother        DDD  . Sarcoidosis Brother   . Cancer Maternal Aunt        breast cancer  . Diabetes Paternal Grandmother   . Stroke Neg Hx     Social History Social History   Tobacco Use  . Smoking status: Never Smoker  . Smokeless tobacco: Never Used  Substance Use Topics  . Alcohol use: Yes    Comment: occasionally/socially  . Drug use: No     Allergies   Patient has no known allergies.   Review of Systems Review of Systems   Physical Exam Triage Vital Signs ED Triage Vitals  Enc Vitals Group     BP 02/13/19 1714 (!) 147/87     Pulse Rate 02/13/19 1714 70     Resp 02/13/19 1714 18     Temp 02/13/19 1714 98.1 F (36.7 C)     Temp Source 02/13/19 1714 Temporal     SpO2 02/13/19 1714 98 %     Weight --      Height --      Head Circumference --      Peak Flow --      Pain Score 02/13/19 1715 4     Pain Loc --      Pain Edu? --      Excl. in [place]? --    No data found.  Updated Vital Signs BP (!) 147/87 (BP Location: Right  Arm)   Pulse 70   Temp 98.1 F (36.7 C) (Temporal)   Resp 18   LMP 01/16/2019 (Exact Date)   SpO2 98%    Physical Exam Constitutional:      General: She is not in acute  distress.    Appearance: She is well-developed.  Cardiovascular:     Rate and Rhythm: Normal rate.  Pulmonary:     Effort: Pulmonary effort is normal.  Skin:    General: Skin is warm and dry.  Neurological:     Mental Status: She is alert and oriented to person, place, and time.    EKG: NSR rate of 62 . Previous EKG was available for review. No stwave changes as interpreted by me.    UC Treatments / Results  Labs (all labs ordered are listed, but only abnormal results are displayed) Labs Reviewed - No data to display  EKG None  Radiology Dg Chest 2 View  Result Date: 02/13/2019 CLINICAL DATA:  Chest pain and shortness of breath EXAM: CHEST - 2 VIEW COMPARISON:  06/06/2018 FINDINGS: The heart size and mediastinal contours are within normal limits. Both lungs are clear. The visualized skeletal structures are unremarkable. IMPRESSION: No active cardiopulmonary disease. Electronically Signed   By: Constance Holster M.D.   On: 02/13/2019 18:46    Procedures Procedures (including critical care time)  Medications Ordered in UC Medications - No data to display  Initial Impression / Assessment and Plan / UC Course  I have reviewed the triage vital signs and the nursing notes.  Pertinent labs & imaging results that were available during my care of the patient were reviewed by me and considered in my medical decision making (see chart for details).     ekg and chest xray reassuring here today. Non toxic. Benign physical exam.  Vitals stable. URI, gerd, anxiety, covid-19 discussed and considered as differentials. Supportive cares recommended as well as 7 days of isolation. Return precautions provided. Patient verbalized understanding and agreeable to plan.  Ambulatory out of clinic without difficulty.    Final Clinical Impressions(s) / UC Diagnoses   Final diagnoses:  Cough     Discharge Instructions     Your chest xray is reassuring.  I have sent a medication you may use as needed for cough.  Daily omeprazole may also help, as sometimes reflux symptoms can cause similar symptoms.  Use of inhaler as needed for wheezing or shortness of breath.   It is reassuring you do not have a fever.  However, due to presence of covid-19 we still do recommend self isolation for the next 7 days.  If develop worsening of chest pain , fevers, difficulty breathing or otherwise worsening please go to the ER.      ED Prescriptions    Medication Sig Dispense Auth. Provider   benzonatate (TESSALON) 100 MG capsule Take 2 capsules (200 mg total) by mouth every 8 (eight) hours. 21 capsule Augusto Gamble B, NP   albuterol (PROAIR HFA) 108 (90 Base) MCG/ACT inhaler Inhale 1-2 puffs into the lungs every 6 (six) hours as needed for wheezing or shortness of breath. 1 Inhaler Augusto Gamble B, NP   omeprazole (PRILOSEC) 20 MG capsule Take 1 capsule (20 mg total) by mouth daily. 30 capsule Zigmund Gottron, NP     Controlled Substance Prescriptions [place] Controlled Substance Registry consulted? Not Applicable   Zigmund Gottron, NP 02/13/19 1920

## 2019-02-13 NOTE — ED Triage Notes (Signed)
Pt sts cough and pain with cough x 3 days; pt sts pain with inspiration

## 2019-02-14 ENCOUNTER — Ambulatory Visit: Payer: Self-pay | Admitting: *Deleted

## 2019-02-14 ENCOUNTER — Ambulatory Visit (INDEPENDENT_AMBULATORY_CARE_PROVIDER_SITE_OTHER): Payer: 59 | Admitting: Family Medicine

## 2019-02-14 ENCOUNTER — Encounter: Payer: Self-pay | Admitting: Family Medicine

## 2019-02-14 DIAGNOSIS — R0602 Shortness of breath: Secondary | ICD-10-CM

## 2019-02-14 DIAGNOSIS — Z113 Encounter for screening for infections with a predominantly sexual mode of transmission: Secondary | ICD-10-CM

## 2019-02-14 DIAGNOSIS — R609 Edema, unspecified: Secondary | ICD-10-CM | POA: Diagnosis not present

## 2019-02-14 DIAGNOSIS — R030 Elevated blood-pressure reading, without diagnosis of hypertension: Secondary | ICD-10-CM | POA: Diagnosis not present

## 2019-02-14 DIAGNOSIS — Z20828 Contact with and (suspected) exposure to other viral communicable diseases: Secondary | ICD-10-CM

## 2019-02-14 NOTE — Progress Notes (Signed)
Virtual Visit via Video Note  I connected with Frances Hill on 02/14/19 at 10:30 AM EDT by a video enabled telemedicine application and verified that I am speaking with the correct person using two identifiers.  Location patient: home Location provider:work or home office Persons participating in the virtual visit: patient, provider  I discussed the limitations of evaluation and management by telemedicine and the availability of in person appointments. The patient expressed understanding and agreed to proceed.   HPI: Pt is a 33 yo female with pmh sig for endometriosis, h/o ovarian cysts, PTSD, obesity, anxiety, depression, HAs, h/o PID who presents with tightness in chest and SOB.  Also feels like can't breathe if lays down.  Pt has been massaging the middle of her chest as she will occasionally feel a sharp pain.  Pt seen in UC yesterday, EKG and CXR negative.  Pt given rx for tessalon, omperazole, and albuterol inhaler.  Advised to self quarantine x 7 days.  Pt has not taken the tessalon as she is not coughing, nor the omeprazole.  Pt's bp was elevated at time of UC visit, 147/87.  Pt with edema in hands and feet x several months.  Mostly noticed at the end of the day.  Endorses increased HAs over the last few months, 2/wk.  Denies joint pain, increased sodium intake, rash, diarrhea, cough, fever, sore throat, h/o asthma.  Pt does have a h/o anxiety, not on meds.  Pt requesting a note for work.  Pt also mentions she was advised to have COVID-19 testing done as she works for [employer] and is in people's homes.  She contacted the health dept prior to making appt today.     ROS: See pertinent positives and negatives per HPI.  Past Medical History:  Diagnosis Date  . Anxiety   . Cyst of right ovary   . Endometriosis   . Fibroids   . History of ovarian cyst    12-25-2013  s/p removal left ovarian endometrioma  . History of pelvic inflammatory disease   . History of trichomoniasis   . Sickle  cell trait ([place])   . UTI (urinary tract infection)     Past Surgical History:  Procedure Laterality Date  . CHROMOPERTUBATION  04/24/2015   Procedure: CHROMOPERTUBATION;  Surgeon: Governor Specking, MD;  Location: [facility];  Service: Gynecology;;  . LAPAROSCOPIC LYSIS OF ADHESIONS  04/24/2015   Procedure: LAPAROSCOPIC LYSIS OF ADHESIONS AND EXCISION ENDOMETRIOTIC IMPLANTS;  Surgeon: Governor Specking, MD;  Location: [place];  Service: Gynecology;;  . LAPAROSCOPIC OVARIAN CYSTECTOMY Right 04/24/2015   Procedure: LAPAROSCOPIC, RIGHT OVARIAN CYSTECTOMY,  GEL PORT ASSISTED MYOMECTOMY;  Surgeon: Governor Specking, MD;  Location: [facility];  Service: Gynecology;  Laterality: Right;  . LAPAROSCOPY N/A 12/25/2013   Procedure: OPERATIVE LAPAROSCOPY;  Surgeon: Osborne Oman, MD;  Location: [facility];  Service: Gynecology;  Laterality: N/A;  . SALPINGOOPHORECTOMY Left 12/25/2013   Procedure: SALPINGO OOPHORECTOMY;  Surgeon: Osborne Oman, MD;  Location: [facility];  Service: Gynecology;  Laterality: Left;    Family History  Problem Relation Age of Onset  . Aneurysm Mother 34       died of aneurysm, brain  . Cancer Mother 92       breast, twice  . Hypertension Father   . Depression Father   . Heart disease Father        valve disease  . Other Father        DDD  .  Other Brother        DDD  . Sarcoidosis Brother   . Cancer Maternal Aunt        breast cancer  . Diabetes Paternal Grandmother   . Stroke Neg Hx     SOCIAL HX:    Current Outpatient Medications:  .  albuterol (PROAIR HFA) 108 (90 Base) MCG/ACT inhaler, Inhale 1-2 puffs into the lungs every 6 (six) hours as needed for wheezing or shortness of breath., Disp: 1 Inhaler, Rfl: 0 .  benzonatate (TESSALON) 100 MG capsule, Take 2 capsules (200 mg total) by mouth every 8 (eight) hours., Disp: 21 capsule, Rfl: 0 .  Elagolix Sodium (ORILISSA) 200 MG TABS, Take 200 mg by mouth 2 (two) times  daily., Disp: 60 tablet, Rfl: 3 .  eszopiclone (LUNESTA) 2 MG TABS tablet, Take 1 tablet (2 mg total) by mouth at bedtime as needed for sleep. Take immediately before bedtime, Disp: 30 tablet, Rfl: 2 .  naproxen (NAPROSYN) 500 MG tablet, Take 1 tablet (500 mg total) by mouth 2 (two) times daily with a meal. As needed for pain, Disp: 60 tablet, Rfl: 2 .  omeprazole (PRILOSEC) 20 MG capsule, Take 1 capsule (20 mg total) by mouth daily., Disp: 30 capsule, Rfl: 0  EXAM:  VITALS per patient if applicable:  RR between 12-20 bpm  GENERAL: alert, oriented, appears well and in no acute distress  HEENT: atraumatic, conjunctiva clear, no obvious abnormalities on inspection of external nose and ears  NECK: normal movements of the head and neck  LUNGS: on inspection no signs of respiratory distress, breathing rate appears normal, no obvious gross SOB, gasping or wheezing  CV: no obvious cyanosis  MS: moves all visible extremities without noticeable abnormality  PSYCH/NEURO: pleasant and cooperative, no obvious depression or anxiety, speech and thought processing grossly intact  ASSESSMENT AND PLAN:  Discussed the following assessment and plan:  SOB (shortness of breath)  -discussed possible causes viral included.  Cannot r/o COVID-19 though less likely given duration -CXR, EKG, and pO2 reassuring from UC -pt contacted in regards to area testing sites. -will provide with note for work -ok to use albuterol inhaler prn - Plan: CBC with Differential/Platelet, Brain Natriuretic Peptide -given precautions  Peripheral edema  -possible causes include medications, increased sodium intake, cardiac etiology, pulmonary etiology, renal etiology, autoimmune d/c -Negative CXR and EKG reassuring. -given duration of symptoms will obtain labs - Plan: CBC with Differential/Platelet, Basic metabolic panel, Brain Natriuretic Peptide, Protein / Creatinine Ratio, Urine, ANA, Anti-DNA antibody, double-stranded,  Anti-Smith antibody, Sedimentation Rate, Complement, total, Urinalysis with Reflex Microscopic  Elevated blood pressure reading without diagnosis of hypertension  -pt advised to obtain bp cuff for home monitoring -given bp elevation and edema concern for possible autoimmune d/o.  Will obtain labs -discussed lifestyle modifications - Plan: Basic metabolic panel, Protein / Creatinine Ratio, Urine, ANA, Anti-DNA antibody, double-stranded, Anti-Smith antibody, Sedimentation Rate, Complement, total, Urinalysis with Reflex Microscopic  F/u prn   I discussed the assessment and treatment plan with the patient. The patient was provided an opportunity to ask questions and all were answered. The patient agreed with the plan and demonstrated an understanding of the instructions.   The patient was advised to call back or seek an in-person evaluation if the symptoms worsen or if the condition fails to improve as anticipated.   Billie Ruddy, MD

## 2019-02-14 NOTE — Telephone Encounter (Signed)
Pt scheduled for virtual visit today.  

## 2019-02-14 NOTE — Telephone Encounter (Signed)
Pt was seen this morning by Dr Volanda Napoleon

## 2019-02-14 NOTE — Telephone Encounter (Signed)
Patient was seen at [facility] yesterday for chest pain- she was cleared for cardiac problems.( EKG, chest X-ray) Patient was told that she could have symptoms of COVID and she should self quarantine. Patient works with [employer] and she needs letter to be excused for work- she has to be tested.  Patient is still having the chest pain. She states she has occasional cough- but nothing bothersome. Headache every other day- some sinus problems, no fever. Patient would like to see her PCP about possible testing and letter needed for work. Call to office for appointment.  Reason for Disposition . Chest pain  Answer Assessment - Initial Assessment Questions 1. COVID-19 DIAGNOSIS: "Who made your Coronavirus (COVID-19) diagnosis?" "Was it confirmed by a positive lab test?" If not diagnosed by a HCP, ask "Are there lots of cases (community spread) where you live?" (See public health department website, if unsure)   * MAJOR community spread: high number of cases; numbers of cases are increasing; many people hospitalized.   * MINOR community spread: low number of cases; not increasing; few or no people hospitalized     minor 2. ONSET: "When did the COVID-19 symptoms start?"      Chest pain- couple of month- patient feels sharp pain when she lays down and when she breaths she has sharp pain 3. WORST SYMPTOM: "What is your worst symptom?" (e.g., cough, fever, shortness of breath, muscle aches)     Pain in chest with deep breath and she gets winded when walking around 4. COUGH: "Do you have a cough?" If so, ask: "How bad is the cough?"       Occasional cough 5. FEVER: "Do you have a fever?" If so, ask: "What is your temperature, how was it measured, and when did it start?"     No fever 6. RESPIRATORY STATUS: "Describe your breathing?" (e.g., shortness of breath, wheezing, unable to speak)      Patient gets winded when she exerts, sharp pain when she lays down or moves around- pressure- inhaler helped  some 7. BETTER-SAME-WORSE: "Are you getting better, staying the same or getting worse compared to yesterday?"  If getting worse, ask, "In what way?"     Staying the same 8. HIGH RISK DISEASE: "Do you have any chronic medical problems?" (e.g., asthma, heart or lung disease, weak immune system, etc.)     no 9. PREGNANCY: "Is there any chance you are pregnant?" "When was your last menstrual period?"     No- LMP- due now- middle of last month 10. OTHER SYMPTOMS: "Do you have any other symptoms?"  (e.g., runny nose, headache, sore throat, loss of smell)       Headache, periodic sore throat  Protocols used: CORONAVIRUS (COVID-19) DIAGNOSED OR SUSPECTED-A-AH

## 2019-02-15 LAB — CBC WITH DIFFERENTIAL/PLATELET
Basophils Absolute: 0 10*3/uL (ref 0.0–0.1)
Basophils Relative: 0.7 % (ref 0.0–3.0)
Eosinophils Absolute: 0.1 10*3/uL (ref 0.0–0.7)
Eosinophils Relative: 1.7 % (ref 0.0–5.0)
HCT: 40.3 % (ref 36.0–46.0)
Hemoglobin: 13.7 g/dL (ref 12.0–15.0)
Lymphocytes Relative: 37.4 % (ref 12.0–46.0)
Lymphs Abs: 2 10*3/uL (ref 0.7–4.0)
MCHC: 33.9 g/dL (ref 30.0–36.0)
MCV: 76.1 fl — ABNORMAL LOW (ref 78.0–100.0)
Monocytes Absolute: 0.5 10*3/uL (ref 0.1–1.0)
Monocytes Relative: 9.1 % (ref 3.0–12.0)
Neutro Abs: 2.7 10*3/uL (ref 1.4–7.7)
Neutrophils Relative %: 51.1 % (ref 43.0–77.0)
Platelets: 275 10*3/uL (ref 150.0–400.0)
RBC: 5.3 Mil/uL — ABNORMAL HIGH (ref 3.87–5.11)
RDW: 14 % (ref 11.5–15.5)
WBC: 5.3 10*3/uL (ref 4.0–10.5)

## 2019-02-15 LAB — URINALYSIS, ROUTINE W REFLEX MICROSCOPIC
Bilirubin Urine: NEGATIVE
Hgb urine dipstick: NEGATIVE
Ketones, ur: NEGATIVE
Leukocytes,Ua: NEGATIVE
Nitrite: NEGATIVE
RBC / HPF: NONE SEEN
Specific Gravity, Urine: 1.015 (ref 1.000–1.030)
Total Protein, Urine: NEGATIVE
Urine Glucose: NEGATIVE
Urobilinogen, UA: 0.2 (ref 0.0–1.0)
pH: 6 (ref 5.0–8.0)

## 2019-02-15 LAB — BASIC METABOLIC PANEL
BUN: 14 mg/dL (ref 6–23)
CO2: 23 mEq/L (ref 19–32)
Calcium: 9.1 mg/dL (ref 8.4–10.5)
Chloride: 105 mEq/L (ref 96–112)
Creatinine, Ser: 0.86 mg/dL (ref 0.40–1.20)
GFR: 92.77 mL/min (ref >60.00)
Glucose, Bld: 90 mg/dL (ref 70–99)
Potassium: 4.3 mEq/L (ref 3.5–5.1)
Sodium: 137 mEq/L (ref 135–145)

## 2019-02-15 LAB — BRAIN NATRIURETIC PEPTIDE: Pro B Natriuretic peptide (BNP): 28 pg/mL (ref 0.0–100.0)

## 2019-02-15 LAB — SEDIMENTATION RATE: Sed Rate: 33 mm/hr — ABNORMAL HIGH (ref 0–20)

## 2019-02-15 NOTE — Addendum Note (Signed)
Addended by: Elmer Picker on: 02/15/2019 08:29 AM   Modules accepted: Orders

## 2019-02-15 NOTE — Addendum Note (Signed)
Addended by: Elmer Picker on: 02/15/2019 09:13 AM   Modules accepted: Orders

## 2019-02-18 LAB — ANTI-DNA ANTIBODY, DOUBLE-STRANDED: ds DNA Ab: 1 IU/mL

## 2019-02-18 LAB — RPR: RPR Ser Ql: NONREACTIVE

## 2019-02-18 LAB — PROTEIN / CREATININE RATIO, URINE
Creatinine, Urine: 69 mg/dL (ref 20–275)
Protein/Creat Ratio: 72 mg/g creat (ref 21–161)
Protein/Creatinine Ratio: 0.072 mg/mg creat (ref 0.021–0.16)
Total Protein, Urine: 5 mg/dL (ref 5–24)

## 2019-02-18 LAB — ANTI-SMITH ANTIBODY: ENA SM Ab Ser-aCnc: <1 AI

## 2019-02-18 LAB — ANA: Anti Nuclear Antibody (ANA): NEGATIVE

## 2019-02-18 LAB — HIV ANTIBODY (ROUTINE TESTING W REFLEX): HIV 1&2 Ab, 4th Generation: NONREACTIVE

## 2019-02-18 LAB — COMPLEMENT, TOTAL: Compl, Total (CH50): >60 U/mL — ABNORMAL HIGH (ref 31–60)

## 2019-02-21 ENCOUNTER — Telehealth: Payer: Self-pay | Admitting: *Deleted

## 2019-02-21 NOTE — Telephone Encounter (Signed)
Ok to extend work note 

## 2019-02-21 NOTE — Telephone Encounter (Signed)
Copied from [place] [id number]. Topic: General - Other >> Feb 21, 2019  8:09 AM Rayann Heman wrote: Reason for CRM: pt called and stated that she has not received covid-19 results and would need another note for work because pt is due to go back tomorrow 02/22/19. Pt would like note emailed to her. [email or web address] pt also stated that she would like a call back regarding recent lab results. Please advise

## 2020-03-18 ENCOUNTER — Ambulatory Visit: Payer: 59 | Admitting: Obstetrics & Gynecology

## 2020-05-20 ENCOUNTER — Emergency Department (HOSPITAL_BASED_OUTPATIENT_CLINIC_OR_DEPARTMENT_OTHER): Payer: 59

## 2020-05-20 ENCOUNTER — Encounter (HOSPITAL_BASED_OUTPATIENT_CLINIC_OR_DEPARTMENT_OTHER): Payer: Self-pay | Admitting: Emergency Medicine

## 2020-05-20 ENCOUNTER — Other Ambulatory Visit: Payer: Self-pay

## 2020-05-20 ENCOUNTER — Emergency Department (HOSPITAL_BASED_OUTPATIENT_CLINIC_OR_DEPARTMENT_OTHER)
Admission: EM | Admit: 2020-05-20 | Discharge: 2020-05-20 | Disposition: A | Discharge status: Home / Self Care | Payer: 59 | Attending: Emergency Medicine | Admitting: Emergency Medicine

## 2020-05-20 DIAGNOSIS — R102 Pelvic and perineal pain: Secondary | ICD-10-CM | POA: Insufficient documentation

## 2020-05-20 DIAGNOSIS — R109 Unspecified abdominal pain: Secondary | ICD-10-CM

## 2020-05-20 DIAGNOSIS — R1084 Generalized abdominal pain: Secondary | ICD-10-CM | POA: Diagnosis present

## 2020-05-20 LAB — CBC WITH DIFFERENTIAL/PLATELET
Abs Immature Granulocytes: 0.04 10*3/uL (ref 0.00–0.07)
Basophils Absolute: 0 10*3/uL (ref 0.0–0.1)
Basophils Relative: 1 %
Eosinophils Absolute: 0.1 10*3/uL (ref 0.0–0.5)
Eosinophils Relative: 1 %
HCT: 37.5 % (ref 36.0–46.0)
Hemoglobin: 12.2 g/dL (ref 12.0–15.0)
Immature Granulocytes: 1 %
Lymphocytes Relative: 28 %
Lymphs Abs: 2.2 10*3/uL (ref 0.7–4.0)
MCH: 25.3 pg — ABNORMAL LOW (ref 26.0–34.0)
MCHC: 32.5 g/dL (ref 30.0–36.0)
MCV: 77.8 fL — ABNORMAL LOW (ref 80.0–100.0)
Monocytes Absolute: 0.6 10*3/uL (ref 0.1–1.0)
Monocytes Relative: 7 %
Neutro Abs: 4.9 10*3/uL (ref 1.7–7.7)
Neutrophils Relative %: 62 %
Platelets: 293 10*3/uL (ref 150–400)
RBC: 4.82 MIL/uL (ref 3.87–5.11)
RDW: 13.5 % (ref 11.5–15.5)
WBC: 7.8 10*3/uL (ref 4.0–10.5)
nRBC: 0 % (ref 0.0–0.2)

## 2020-05-20 LAB — LIPASE, BLOOD: Lipase: 21 U/L (ref 11–51)

## 2020-05-20 LAB — COMPREHENSIVE METABOLIC PANEL
ALT: 14 U/L (ref 0–44)
AST: 17 U/L (ref 15–41)
Albumin: 3.7 g/dL (ref 3.5–5.0)
Alkaline Phosphatase: 49 U/L (ref 38–126)
Anion gap: 7 (ref 5–15)
BUN: 8 mg/dL (ref 6–20)
CO2: 27 mmol/L (ref 22–32)
Calcium: 8.8 mg/dL — ABNORMAL LOW (ref 8.9–10.3)
Chloride: 103 mmol/L (ref 98–111)
Creatinine, Ser: 0.73 mg/dL (ref 0.44–1.00)
GFR calc Af Amer: >60 mL/min (ref >60)
GFR calc non Af Amer: >60 mL/min (ref >60)
Glucose, Bld: 95 mg/dL (ref 70–99)
Potassium: 3.4 mmol/L — ABNORMAL LOW (ref 3.5–5.1)
Sodium: 137 mmol/L (ref 135–145)
Total Bilirubin: 0.5 mg/dL (ref 0.3–1.2)
Total Protein: 7.1 g/dL (ref 6.5–8.1)

## 2020-05-20 LAB — URINALYSIS, MICROSCOPIC (REFLEX)

## 2020-05-20 LAB — URINALYSIS, ROUTINE W REFLEX MICROSCOPIC
Bilirubin Urine: NEGATIVE
Glucose, UA: NEGATIVE mg/dL
Ketones, ur: NEGATIVE mg/dL
Nitrite: NEGATIVE
Protein, ur: NEGATIVE mg/dL
Specific Gravity, Urine: 1.015 (ref 1.005–1.030)
pH: 6 (ref 5.0–8.0)

## 2020-05-20 LAB — PREGNANCY, URINE: Preg Test, Ur: NEGATIVE

## 2020-05-20 MED ORDER — HYDROMORPHONE HCL 1 MG/ML IJ SOLN
1.0000 mg | Freq: Once | INTRAMUSCULAR | Status: AC
  Administered 2020-05-20: 1 mg via INTRAVENOUS
  Filled 2020-05-20: qty 1

## 2020-05-20 MED ORDER — KETOROLAC TROMETHAMINE 15 MG/ML IJ SOLN
15.0000 mg | Freq: Once | INTRAMUSCULAR | Status: AC
  Administered 2020-05-20: 15 mg via INTRAVENOUS
  Filled 2020-05-20: qty 1

## 2020-05-20 MED ORDER — TRAMADOL HCL 50 MG PO TABS: 50.0000 mg | ORAL_TABLET | Freq: Three times a day (TID) | ORAL | 0 refills | Status: DC | PRN

## 2020-05-20 MED FILL — traMADol HCL 50 MG TABS: 50 | 3 days supply | Qty: 8 | Fill #0

## 2020-05-20 NOTE — ED Triage Notes (Signed)
Generalized abdominal pain since Saturday upon onset of period.  Pt has history of endometriosis.  Taking aleve without relief but taking 6 per day.  Some N/V due to aleve.

## 2020-05-20 NOTE — ED Provider Notes (Signed)
[facility] DEPART[facility] Provider Note   CSN: [id number] Arrival date & time: 05/20/20  1018     History Chief Complaint  Patient presents with  . Abdominal Pain    Frances Hill is a 34 y.o. female.  HPI   34 year old female with abdominal pain.  Onset Saturday.  History of endometriosis.  She is unsure of current symptoms are related to this or not.  She has been taken multiple Aleve per day without much improvement.  No fevers or chills.  No diarrhea.  No urinary complaints.  Symptoms of vaginal bleeding but not out of line what is typical for her.  No discharge.  Past Medical History:  Diagnosis Date  . Anxiety   . Cyst of right ovary   . Endometriosis   . Fibroids   . History of ovarian cyst    12-25-2013  s/p removal left ovarian endometrioma  . History of pelvic inflammatory disease   . History of trichomoniasis   . Sickle cell trait ([place])   . UTI (urinary tract infection)     Patient Active Problem List   Diagnosis Date Noted  . History of depression 11/02/2018  . Family history of breast cancer 01/30/2018  . Routine general medical examination at a health care facility 10/27/2016  . Vaginal discharge 10/27/2016  . Screen for STD (sexually transmitted disease) 10/27/2016  . Obesity with serious comorbidity 10/27/2016  . Influenza vaccination declined 10/27/2016  . Encounter for surveillance of contraceptive pills 10/27/2016  . Depressed mood 10/27/2016  . PTSD (post-traumatic stress disorder) 10/27/2016  . Screening for cervical cancer 10/27/2016  . Frequent headaches 09/30/2015  . Generalized anxiety disorder 09/30/2015  . Insomnia 09/30/2015  . Chronic pelvic pain in female 09/30/2015  . Female fertility problem 09/30/2015  . Endometriosis of ovary 04/24/2015  . Dyspareunia 04/22/2015  . Cyst of right ovary 04/22/2015  . Endometriosis 01/14/2014  . Ovarian cyst 12/11/2013    Past Surgical History:  Procedure Laterality Date    . CHROMOPERTUBATION  04/24/2015   Procedure: CHROMOPERTUBATION;  Surgeon: Governor Specking, MD;  Location: [facility];  Service: Gynecology;;  . LAPAROSCOPIC LYSIS OF ADHESIONS  04/24/2015   Procedure: LAPAROSCOPIC LYSIS OF ADHESIONS AND EXCISION ENDOMETRIOTIC IMPLANTS;  Surgeon: Governor Specking, MD;  Location: [facility];  Service: Gynecology;;  . LAPAROSCOPIC OVARIAN CYSTECTOMY Right 04/24/2015   Procedure: LAPAROSCOPIC, RIGHT OVARIAN CYSTECTOMY,  GEL PORT ASSISTED MYOMECTOMY;  Surgeon: Governor Specking, MD;  Location: [facility];  Service: Gynecology;  Laterality: Right;  . LAPAROSCOPY N/A 12/25/2013   Procedure: OPERATIVE LAPAROSCOPY;  Surgeon: Osborne Oman, MD;  Location: [facility];  Service: Gynecology;  Laterality: N/A;  . SALPINGOOPHORECTOMY Left 12/25/2013   Procedure: SALPINGO OOPHORECTOMY;  Surgeon: Osborne Oman, MD;  Location: [facility];  Service: Gynecology;  Laterality: Left;     OB History    Gravida  1   Para  0   Term  0   Preterm  0   AB  1   Living  0     SAB  0   TAB  0   Ectopic  1   Multiple  0   Live Births              Family History  Problem Relation Age of Onset  . Aneurysm Mother 31       died of aneurysm, brain  . Cancer Mother 11       breast,  twice  . Hypertension Father   . Depression Father   . Heart disease Father        valve disease  . Other Father        DDD  . Other Brother        DDD  . Sarcoidosis Brother   . Cancer Maternal Aunt        breast cancer  . Diabetes Paternal Grandmother   . Stroke Neg Hx     Social History   Tobacco Use  . Smoking status: Never Smoker  . Smokeless tobacco: Never Used  Vaping Use  . Vaping Use: Never used  Substance Use Topics  . Alcohol use: Yes    Comment: occasionally/socially  . Drug use: No    Home Medications Prior to Admission medications   Medication Sig Start Date End Date Taking? Authorizing Provider  albuterol  (PROAIR HFA) 108 (90 Base) MCG/ACT inhaler Inhale 1-2 puffs into the lungs every 6 (six) hours as needed for wheezing or shortness of breath. 02/13/19   Zigmund Gottron, NP  benzonatate (TESSALON) 100 MG capsule Take 2 capsules (200 mg total) by mouth every 8 (eight) hours. 02/13/19   Zigmund Gottron, NP  Elagolix Sodium (ORILISSA) 200 MG TABS Take 200 mg by mouth 2 (two) times daily. 11/28/18   Anyanwu, Sallyanne Havers, MD  eszopiclone (LUNESTA) 2 MG TABS tablet Take 1 tablet (2 mg total) by mouth at bedtime as needed for sleep. Take immediately before bedtime 10/16/18   Sherrilyn Rist A, MD  naproxen (NAPROSYN) 500 MG tablet Take 1 tablet (500 mg total) by mouth 2 (two) times daily with a meal. As needed for pain 11/28/18   Anyanwu, Ugonna A, MD  omeprazole (PRILOSEC) 20 MG capsule Take 1 capsule (20 mg total) by mouth daily. 02/13/19   Zigmund Gottron, NP    Allergies    Patient has no known allergies.  Review of Systems   Review of Systems All systems reviewed and negative, other than as noted in HPI.  Physical Exam Updated Vital Signs BP (!) 140/98   Pulse 73   Temp 98.2 F (36.8 C) (Oral)   Resp 16   Ht 5\' 6"  (1.676 m)   Wt 113.4 kg   LMP 05/16/2020   SpO2 98%   BMI 40.35 kg/m   Physical Exam Vitals and nursing note reviewed.  Constitutional:      General: She is not in acute distress.    Appearance: She is well-developed.  HENT:     Head: Normocephalic and atraumatic.  Eyes:     General:        Right eye: No discharge.        Left eye: No discharge.     Conjunctiva/sclera: Conjunctivae normal.  Cardiovascular:     Rate and Rhythm: Normal rate and regular rhythm.     Heart sounds: Normal heart sounds. No murmur heard.  No friction rub. No gallop.   Pulmonary:     Effort: Pulmonary effort is normal. No respiratory distress.     Breath sounds: Normal breath sounds.  Abdominal:     General: There is no distension.     Palpations: Abdomen is soft.     Tenderness: There is  abdominal tenderness.     Comments: Lower abdominal tenderness, worse suprapubically without rebound or guarding.  Musculoskeletal:        General: No tenderness.     Cervical back: Neck supple.  Skin:  General: Skin is warm and dry.  Neurological:     Mental Status: She is alert.  Psychiatric:        Behavior: Behavior normal.        Thought Content: Thought content normal.     ED Results / Procedures / Treatments   Labs (all labs ordered are listed, but only abnormal results are displayed) Labs Reviewed  URINALYSIS, ROUTINE W REFLEX MICROSCOPIC - Abnormal; Notable for the following components:      Result Value   APPearance CLOUDY (*)    Hgb urine dipstick LARGE (*)    Leukocytes,Ua SMALL (*)    All other components within normal limits  URINALYSIS, MICROSCOPIC (REFLEX) - Abnormal; Notable for the following components:   Bacteria, UA MANY (*)    All other components within normal limits  CBC WITH DIFFERENTIAL/PLATELET - Abnormal; Notable for the following components:   MCV 77.8 (*)    MCH 25.3 (*)    All other components within normal limits  COMPREHENSIVE METABOLIC PANEL - Abnormal; Notable for the following components:   Potassium 3.4 (*)    Calcium 8.8 (*)    All other components within normal limits  PREGNANCY, URINE  LIPASE, BLOOD    EKG None  Radiology No results found.   US PELVIC COMPLETE WITH TRANSVAGINAL  Result Date: 05/20/2020 CLINICAL DATA:  Pelvic pain.  History of left oophorectomy EXAM: TRANSABDOMINAL AND TRANSVAGINAL ULTRASOUND OF PELVIS TECHNIQUE: Both transabdominal and transvaginal ultrasound examinations of the pelvis were performed. Transabdominal technique was performed for global imaging of the pelvis including uterus, ovaries, adnexal regions, and pelvic cul-de-sac. It was necessary to proceed with endovaginal exam following the transabdominal exam to visualize the right ovary. COMPARISON:  11/06/2018 FINDINGS: Uterus Measurements: 12.6 x  8.7 x 8.0 cm = volume: 455 mL. Redemonstration of multiple heterogeneously hypoechoic uterine fibroids, largest is exophytic emanating from the posterior uterine body measuring 7.8 x 4.9 x 5.1 cm. Fundal fibroid measures up to 3.5 cm. Additional posterior uterine body fibroid measuring up to 4.4 cm. Endometrium Thickness: 15 mm.  No focal abnormality visualized. Right ovary Measurements: 4.2 x 2.1 x 3.0 cm = volume: 14 mL. Right ovarian cyst or follicle measures up to 2.3 cm. Left ovary Surgically absent. Other findings No abnormal free fluid. IMPRESSION: 1. Multifibroid uterus including a large exophytic fibroid emanating from the posterior uterine body measuring up to 7.8 cm (previously measured up to 5.4 cm). 2. Surgically absent left ovary. 3. Remainder of the exam is within normal limits. Electronically Signed   By: Davina Poke D.O.   On: 05/20/2020 15:34    Procedures Procedures (including critical care time)  Medications Ordered in ED Medications - No data to display  ED Course  I have reviewed the triage vital signs and the nursing notes.  Pertinent labs & imaging results that were available during my care of the patient were reviewed by me and considered in my medical decision making (see chart for details).    MDM Rules/Calculators/A&P                          34 year old female with lower abdominal/pelvic pain.  Likely secondary to uterine fibroids.  Multiple ones noted on imaging.  She has GYN follow-up.  Symptomatic treatment.  Return precautions discussed. Final Clinical Impression(s) / ED Diagnoses Final diagnoses:  Pelvic pain  Abdominal pain, unspecified abdominal location    Rx / DC Orders ED Discharge Orders  None       Virgel Manifold, MD 05/27/20 [phone]

## 2020-05-27 ENCOUNTER — Encounter: Payer: 59 | Admitting: Family Medicine

## 2020-06-03 ENCOUNTER — Other Ambulatory Visit: Payer: Self-pay

## 2020-06-04 ENCOUNTER — Encounter: Payer: 59 | Admitting: Family Medicine

## 2020-06-16 ENCOUNTER — Other Ambulatory Visit: Payer: Self-pay

## 2020-06-17 ENCOUNTER — Ambulatory Visit: Payer: 59 | Admitting: Family Medicine

## 2020-06-18 ENCOUNTER — Ambulatory Visit: Payer: 59 | Admitting: Family Medicine

## 2020-08-05 ENCOUNTER — Other Ambulatory Visit: Payer: Self-pay

## 2020-08-05 ENCOUNTER — Encounter: Payer: Self-pay | Admitting: Family Medicine

## 2020-08-05 ENCOUNTER — Other Ambulatory Visit (HOSPITAL_COMMUNITY)
Admission: RE | Admit: 2020-08-05 | Discharge: 2020-08-05 | Disposition: A | Discharge status: Home / Self Care | Payer: 59 | Source: Ambulatory Visit | Attending: Family Medicine | Admitting: Family Medicine

## 2020-08-05 ENCOUNTER — Ambulatory Visit (INDEPENDENT_AMBULATORY_CARE_PROVIDER_SITE_OTHER): Payer: 59 | Admitting: Family Medicine

## 2020-08-05 VITALS — BP 138/86 | HR 84 | Temp 98.5°F | Ht 66.0 in | Wt 256.0 lb

## 2020-08-05 DIAGNOSIS — R030 Elevated blood-pressure reading, without diagnosis of hypertension: Secondary | ICD-10-CM | POA: Diagnosis not present

## 2020-08-05 DIAGNOSIS — N809 Endometriosis, unspecified: Secondary | ICD-10-CM | POA: Diagnosis not present

## 2020-08-05 DIAGNOSIS — F411 Generalized anxiety disorder: Secondary | ICD-10-CM

## 2020-08-05 DIAGNOSIS — F321 Major depressive disorder, single episode, moderate: Secondary | ICD-10-CM

## 2020-08-05 DIAGNOSIS — Z113 Encounter for screening for infections with a predominantly sexual mode of transmission: Secondary | ICD-10-CM

## 2020-08-05 DIAGNOSIS — N898 Other specified noninflammatory disorders of vagina: Secondary | ICD-10-CM | POA: Diagnosis not present

## 2020-08-05 DIAGNOSIS — Z0001 Encounter for general adult medical examination with abnormal findings: Secondary | ICD-10-CM

## 2020-08-05 DIAGNOSIS — Z124 Encounter for screening for malignant neoplasm of cervix: Secondary | ICD-10-CM

## 2020-08-05 DIAGNOSIS — Z0000 Encounter for general adult medical examination without abnormal findings: Secondary | ICD-10-CM

## 2020-08-05 DIAGNOSIS — Z6841 Body Mass Index (BMI) 40.0 and over, adult: Secondary | ICD-10-CM

## 2020-08-05 MED ORDER — VENLAFAXINE HCL 75 MG PO TABS: 150.0000 mg | ORAL_TABLET | Freq: Every day | ORAL | 1 refills | Status: DC

## 2020-08-05 NOTE — Progress Notes (Signed)
Subjective:     Frances Hill is a 34 y.o. female with pmh sig for stage IV endometriosis, fibroids, ectopic pregnancy, left nephrectomy, mass on right ovary, sickle cell trait, PTSD, obesity, depression, anxiety who is here for a comprehensive physical exam. The patient reports some anxiety and depressed mood on Effexor 150 mg daily.  Last Pap 10/27/2016, HPV negative.  Pt tries to get yearly paps 2/2 personal h/o stage IV endometriosis confirmed by laproscopy and h/o cancer in family.  Pt's mom dx'd with breast ca at age 8.  Pt denies any changes in breast.  Checks breast occasionally but states does not know what she is feeling.  Pt concerned about fertility 2/2 h/o fibroids, having a single ovary (R) s/p surgical removal of L ovary 2/2 complex ovarian cyst, and h/o ectopic pregnancy.  Pt looking into IUI but notes the cost is prohibitive.    Social History   Socioeconomic History  . Marital status: Single    Spouse name: Not on file  . Number of children: Not on file  . Years of education: Not on file  . Highest education level: Not on file  Occupational History  . Not on file  Tobacco Use  . Smoking status: Never Smoker  . Smokeless tobacco: Never Used  Vaping Use  . Vaping Use: Never used  Substance and Sexual Activity  . Alcohol use: Yes    Comment: occasionally/socially  . Drug use: No  . Sexual activity: Yes    Birth control/protection: Condom  Other Topics Concern  . Not on file  Social History Narrative   [employer], lives alone, no children, Education officer, museum at [employer], exercise - 3-4 times per week, walks 1-2 miles, weights at [employer], other cardio, diet is good, improved from 2014   Social Determinants of Health   Financial Resource Strain:   . Difficulty of Paying Living Expenses: Not on file  Food Insecurity:   . Worried About Charity fundraiser in the Last Year: Not on file  . Ran Out of Food in the Last Year: Not on file  Transportation  Needs:   . Lack of Transportation (Medical): Not on file  . Lack of Transportation (Non-Medical): Not on file  Physical Activity:   . Days of Exercise per Week: Not on file  . Minutes of Exercise per Session: Not on file  Stress:   . Feeling of Stress : Not on file  Social Connections:   . Frequency of Communication with Friends and Family: Not on file  . Frequency of Social Gatherings with Friends and Family: Not on file  . Attends Religious Services: Not on file  . Active Member of Clubs or Organizations: Not on file  . Attends Archivist Meetings: Not on file  . Marital Status: Not on file  Intimate Partner Violence:   . Fear of Current or Ex-Partner: Not on file  . Emotionally Abused: Not on file  . Physically Abused: Not on file  . Sexually Abused: Not on file   Health Maintenance  Topic Date Due  . COVID-19 Vaccine (1) Never done  . PAP SMEAR-Modifier  10/28/2019  . INFLUENZA VACCINE  Never done  . TETANUS/TDAP  10/27/2026  . Hepatitis C Screening  Completed  . HIV Screening  Completed    The following portions of the patient's history were reviewed and updated as appropriate: allergies, current medications, past family history, past medical history, past social history, past surgical history and  problem list.  Review of Systems Pertinent items noted in HPI and remainder of comprehensive ROS otherwise negative.   Objective:    BP 138/86 (BP Location: Left Arm, Patient Position: Sitting, Cuff Size: Large)   Pulse 84   Temp 98.5 F (36.9 C) (Oral)   Ht _0  (1.676 m)   Wt 256 lb (116.1 kg)   LMP 07/05/2020 (Exact Date)   SpO2 98%   BMI 41.32 kg/m  General appearance: alert, cooperative, appears stated age and no distress Head: Normocephalic, without obvious abnormality, atraumatic Eyes: conjunctivae/corneas clear. PERRL, EOM's intact. Fundi benign. Ears: normal TM's and external ear canals both ears Nose: Nares normal. Septum midline. Mucosa normal.  No drainage or sinus tenderness. Throat: lips, mucosa, and tongue normal; teeth and gums normal Neck: no adenopathy, no carotid bruit, no JVD, supple, symmetrical, trachea midline and thyroid not enlarged, symmetric, no tenderness/mass/nodules Lungs: clear to auscultation bilaterally Heart: regular rate and rhythm, S1, S2 normal, no murmur, click, rub or gallop Abdomen: soft, non-tender; bowel sounds normal; no masses,  no organomegaly Pelvic: cervix normal in appearance, external genitalia normal, no adnexal masses or tenderness, no cervical motion tenderness, rectovaginal septum normal, uterus normal size, shape, and consistency and normal vaginal rugae with scant whitish discharge in vaginal vault. Extremities: extremities normal, atraumatic, no cyanosis or edema Pulses: 2+ and symmetric Skin: Skin color, texture, turgor normal. No rashes or lesions Lymph nodes: Cervical, supraclavicular, and axillary nodes normal. Neurologic: Alert and oriented X 3, normal strength and tone. Normal symmetric reflexes. Normal coordination and gait    Assessment:    Healthy female exam with anxiety, depression.      Plan:     Anticipatory guidance given including wearing seatbelts, smoke detectors in the home, increasing physical activity, increasing p.o. intake of water and vegetables. -We will obtain labs -Pap obtained this visit.  Last pap 10/27/2016-HPV negative -Patient instructed on self breast exam -Given handouts -Next CPE in 1 year See After Visit Summary for Counseling Recommendations    Vaginal discharge -LMP 07/05/2020 -Noted on exam -We will obtain testing via Pap for trichomoniasis, BV, yeast -We will send in Rx if needed based on results.  Routine screening for STI (sexually transmitted infection)  - Plan: HIV Antibody (routine testing w rflx), RPR, PAP [[place]], RPR, HIV Antibody (routine testing w rflx)  Elevated blood pressure reading without diagnosis of hypertension  -BP  138/86 -Discussed lifestyle modifications -Patient encouraged to obtain BP cuff for home monitoring -We will follow-up in 1 month.  If BP continues to be elevated will start low-dose meds such as Norvasc or HCTZ. - Plan: TSH, T4, free, CMP with eGFR([facility])  Endometriosis -Stable -Continue follow-up with OB/GYN - Plan: CBC with Differential/Platelet, CMP with eGFR([facility])  Screening for cervical cancer  - Plan: PAP [[place]]  GAD (generalized anxiety disorder) -PHQ-9 score 16. -GAD-7 score 7. -Discussed counseling -Discussed self-care - Plan: TSH, T4, free, venlafaxine (EFFEXOR) 75 MG tablet  BMI 40.0-44.9, adult (HCC) -Discussed lifestyle modifications including increasing physical activity, decreasing portion sizes -Plan TSH, T4, free, hemoglobin A1c, lipid panel  Depression, major, single episode, moderate (HCC) -PHQ-9 score 16 -Continue Effexor 150 mg daily -Discussed counseling options -Discussed self-care -Given precautions -Follow-up in 1 month, sooner if needed -Plan: TSH, T4, free, venlafaxine (EFFEXOR) 75 mg tablet  F/u in 1 month, sooner if needed.  Grier Mitts, MD

## 2020-08-05 NOTE — Patient Instructions (Addendum)
Preventive Care 21-33 Years Old, Female Preventive care refers to visits with your health care provider and lifestyle choices that can promote health and wellness. This includes:  A yearly physical exam. This may also be called an annual well check.  Regular dental visits and eye exams.  Immunizations.  Screening for certain conditions.  Healthy lifestyle choices, such as eating a healthy diet, getting regular exercise, not using drugs or products that contain nicotine and tobacco, and limiting alcohol use. What can I expect for my preventive care visit? Physical exam Your health care provider will check your:  Height and weight. This may be used to calculate body mass index (BMI), which tells if you are at a healthy weight.  Heart rate and blood pressure.  Skin for abnormal spots. Counseling Your health care provider may ask you questions about your:  Alcohol, tobacco, and drug use.  Emotional well-being.  Home and relationship well-being.  Sexual activity.  Eating habits.  Work and work environment.  Method of birth control.  Menstrual cycle.  Pregnancy history. What immunizations do I need?  Influenza (flu) vaccine  This is recommended every year. Tetanus, diphtheria, and pertussis (Tdap) vaccine  You may need a Td booster every 10 years. Varicella (chickenpox) vaccine  You may need this if you have not been vaccinated. Human papillomavirus (HPV) vaccine  If recommended by your health care provider, you may need three doses over 6 months. Measles, mumps, and rubella (MMR) vaccine  You may need at least one dose of MMR. You may also need a second dose. Meningococcal conjugate (MenACWY) vaccine  One dose is recommended if you are age 19-21 years and a first-year college student living in a residence hall, or if you have one of several medical conditions. You may also need additional booster doses. Pneumococcal conjugate (PCV13) vaccine  You may need  this if you have certain conditions and were not previously vaccinated. Pneumococcal polysaccharide (PPSV23) vaccine  You may need one or two doses if you smoke cigarettes or if you have certain conditions. Hepatitis A vaccine  You may need this if you have certain conditions or if you travel or work in places where you may be exposed to hepatitis A. Hepatitis B vaccine  You may need this if you have certain conditions or if you travel or work in places where you may be exposed to hepatitis B. Haemophilus influenzae type b (Hib) vaccine  You may need this if you have certain conditions. You may receive vaccines as individual doses or as more than one vaccine together in one shot (combination vaccines). Talk with your health care provider about the risks and benefits of combination vaccines. What tests do I need?  Blood tests  Lipid and cholesterol levels. These may be checked every 5 years starting at age 20.  Hepatitis C test.  Hepatitis B test. Screening  Diabetes screening. This is done by checking your blood sugar (glucose) after you have not eaten for a while (fasting).  Sexually transmitted disease (STD) testing.  BRCA-related cancer screening. This may be done if you have a family history of breast, ovarian, tubal, or peritoneal cancers.  Pelvic exam and Pap test. This may be done every 3 years starting at age 21. Starting at age 30, this may be done every 5 years if you have a Pap test in combination with an HPV test. Talk with your health care provider about your test results, treatment options, and if necessary, the need for more tests.   Follow these instructions at home: Eating and drinking   Eat a diet that includes fresh fruits and vegetables, whole grains, lean protein, and low-fat dairy.  Take vitamin and mineral supplements as recommended by your health care provider.  Do not drink alcohol if: ? Your health care provider tells you not to drink. ? You are  pregnant, may be pregnant, or are planning to become pregnant.  If you drink alcohol: ? Limit how much you have to 0-1 drink a day. ? Be aware of how much alcohol is in your drink. In the [place] one drink equals one 12 oz bottle of beer (355 mL), one 5 oz glass of wine (148 mL), or one 1 oz glass of hard liquor (44 mL). Lifestyle  Take daily care of your teeth and gums.  Stay active. Exercise for at least 30 minutes on 5 or more days each week.  Do not use any products that contain nicotine or tobacco, such as cigarettes, e-cigarettes, and chewing tobacco. If you need help quitting, ask your health care provider.  If you are sexually active, practice safe sex. Use a condom or other form of birth control (contraception) in order to prevent pregnancy and STIs (sexually transmitted infections). If you plan to become pregnant, see your health care provider for a preconception visit. What's next?  Visit your health care provider once a year for a well check visit.  Ask your health care provider how often you should have your eyes and teeth checked.  Stay up to date on all vaccines. This information is not intended to replace advice given to you by your health care provider. Make sure you discuss any questions you have with your health care provider. Document Revised: 05/31/2018 Document Reviewed: 05/31/2018 Elsevier Patient Education  [employer].  Endometriosis  Endometriosis is a condition in which the tissue that lines the uterus (endometrium) grows outside of its normal location. The tissue may grow in many locations close to the uterus, but it commonly grows on the ovaries, fallopian tubes, vagina, or bowel. When the uterus sheds the endometrium every menstrual cycle, there is bleeding wherever the endometrial tissue is located. This can cause pain because blood is irritating to tissues that are not normally exposed to it. What are the causes? The cause of endometriosis is not  known. What increases the risk? You may be more likely to develop endometriosis if you:  Have a family history of endometriosis.  Have never given birth.  Started your period at age 67 or younger.  Have high levels of estrogen in your body.  Were exposed to a certain medicine (diethylstilbestrol) before you were born (in utero).  Had low birth weight.  Were born as a twin, triplet, or other multiple.  Have a BMI of less than 25. BMI is an estimate of body fat and is calculated from height and weight. What are the signs or symptoms? Often, there are no symptoms of this condition. If you do have symptoms, they may:  Vary depending on where your endometrial tissue is growing.  Occur during your menstrual period (most common) or midcycle.  Come and go, or you may go months with no symptoms at all.  Stop with menopause. Symptoms may include:  Pain in the back or abdomen.  Heavier bleeding during periods.  Pain during sex.  Painful bowel movements.  Infertility.  Pelvic pain.  Bleeding more than once a month. How is this diagnosed? This condition is diagnosed based on your  symptoms and a physical exam. You may have tests, such as:  Blood tests and urine tests. These may be done to help rule out other possible causes of your symptoms.  Ultrasound, to look for abnormal tissues.  An X-ray of the lower bowel (barium enema).  An ultrasound that is done through the vagina (transvaginally).  CT scan.  MRI.  Laparoscopy. In this procedure, a lighted, pencil-sized instrument called a laparoscope is inserted into your abdomen through an incision. The laparoscope allows your health care provider to look at the organs inside your body and check for abnormal tissue to confirm the diagnosis. If abnormal tissue is found, your health care provider may remove a small piece of tissue (biopsy) to be examined under a microscope. How is this treated? Treatment for this condition may  include:  Medicines to relieve pain, such as NSAIDs.  Hormone therapy. This involves using artificial (synthetic) hormones to reduce endometrial tissue growth. Your health care provider may recommend using a hormonal form of birth control, or other medicines.  Surgery. This may be done to remove abnormal endometrial tissue. ? In some cases, tissue may be removed using a laparoscope and a laser (laparoscopic laser treatment). ? In severe cases, surgery may be done to remove the fallopian tubes, uterus, and ovaries (hysterectomy). Follow these instructions at home:  Take over-the-counter and prescription medicines only as told by your health care provider.  Do not drive or use heavy machinery while taking prescription pain medicine.  Try to avoid activities that cause pain, including sexual activity.  Keep all follow-up visits as told by your health care provider. This is important. Contact a health care provider if:  You have pain in the area between your hip bones (pelvic area) that occurs: ? Before, during, or after your period. ? In between your period and gets worse during your period. ? During or after sex. ? With bowel movements or urination, especially during your period.  You have problems getting pregnant.  You have a fever. Get help right away if:  You have severe pain that does not get better with medicine.  You have severe nausea and vomiting, or you cannot eat without vomiting.  You have pain that affects only the lower, right side of your abdomen.  You have abdominal pain that gets worse.  You have abdominal swelling.  You have blood in your stool. This information is not intended to replace advice given to you by your health care provider. Make sure you discuss any questions you have with your health care provider. Document Revised: 09/01/2017 Document Reviewed: 02/20/2016 Elsevier Patient Education  [place], Adult After being  diagnosed with an anxiety disorder, you may be relieved to know why you have felt or behaved a certain way. You may also feel overwhelmed about the treatment ahead and what it will mean for your life. With care and support, you can manage this condition and recover from it. How to manage lifestyle changes Managing stress and anxiety  Stress is your body's reaction to life changes and events, both good and bad. Most stress will last just a few hours, but stress can be ongoing and can lead to more than just stress. Although stress can play a major role in anxiety, it is not the same as anxiety. Stress is usually caused by something external, such as a deadline, test, or competition. Stress normally passes after the triggering event has ended.  Anxiety is caused by something internal,  such as imagining a terrible outcome or worrying that something will go wrong that will devastate you. Anxiety often does not go away even after the triggering event is over, and it can become long-term (chronic) worry. It is important to understand the differences between stress and anxiety and to manage your stress effectively so that it does not lead to an anxious response. Talk with your health care provider or a counselor to learn more about reducing anxiety and stress. He or she may suggest tension reduction techniques, such as:  Music therapy. This can include creating or listening to music that you enjoy and that inspires you.  Mindfulness-based meditation. This involves being aware of your normal breaths while not trying to control your breathing. It can be done while sitting or walking.  Centering prayer. This involves focusing on a word, phrase, or sacred image that means something to you and brings you peace.  Deep breathing. To do this, expand your stomach and inhale slowly through your nose. Hold your breath for 3-5 seconds. Then exhale slowly, letting your stomach muscles relax.  Self-talk. This involves  identifying thought patterns that lead to anxiety reactions and changing those patterns.  Muscle relaxation. This involves tensing muscles and then relaxing them. Choose a tension reduction technique that suits your lifestyle and personality. These techniques take time and practice. Set aside 5-15 minutes a day to do them. Therapists can offer counseling and training in these techniques. The training to help with anxiety may be covered by some insurance plans. Other things you can do to manage stress and anxiety include:  Keeping a stress/anxiety diary. This can help you learn what triggers your reaction and then learn ways to manage your response.  Thinking about how you react to certain situations. You may not be able to control everything, but you can control your response.  Making time for activities that help you relax and not feeling guilty about spending your time in this way.  Visual imagery and yoga can help you stay calm and relax.  Medicines Medicines can help ease symptoms. Medicines for anxiety include:  Anti-anxiety drugs.  Antidepressants. Medicines are often used as a primary treatment for anxiety disorder. Medicines will be prescribed by a health care provider. When used together, medicines, psychotherapy, and tension reduction techniques may be the most effective treatment. Relationships Relationships can play a big part in helping you recover. Try to spend more time connecting with trusted friends and family members. Consider going to couples counseling, taking family education classes, or going to family therapy. Therapy can help you and others better understand your condition. How to recognize changes in your anxiety Everyone responds differently to treatment for anxiety. Recovery from anxiety happens when symptoms decrease and stop interfering with your daily activities at home or work. This may mean that you will start to:  Have better concentration and focus. Worry  will interfere less in your daily thinking.  Sleep better.  Be less irritable.  Have more energy.  Have improved memory. It is important to recognize when your condition is getting worse. Contact your health care provider if your symptoms interfere with home or work and you feel like your condition is not improving. Follow these instructions at home: Activity  Exercise. Most adults should do the following: ? Exercise for at least 150 minutes each week. The exercise should increase your heart rate and make you sweat (moderate-intensity exercise). ? Strengthening exercises at least twice a week.  Get the right amount  and quality of sleep. Most adults need 7-9 hours of sleep each night. Lifestyle   Eat a healthy diet that includes plenty of vegetables, fruits, whole grains, low-fat dairy products, and lean protein. Do not eat a lot of foods that are high in solid fats, added sugars, or salt.  Make choices that simplify your life.  Do not use any products that contain nicotine or tobacco, such as cigarettes, e-cigarettes, and chewing tobacco. If you need help quitting, ask your health care provider.  Avoid caffeine, alcohol, and certain over-the-counter cold medicines. These may make you feel worse. Ask your pharmacist which medicines to avoid. General instructions  Take over-the-counter and prescription medicines only as told by your health care provider.  Keep all follow-up visits as told by your health care provider. This is important. Where to find support You can get help and support from these sources:  Self-help groups.  Online and [employer].  A trusted spiritual leader.  Couples counseling.  Family education classes.  Family therapy. Where to find more information You may find that joining a support group helps you deal with your anxiety. The following sources can help you locate counselors or support groups near you:  [place]:  www.mentalhealthamerica.net  Anxiety and Depression Association of [place] (ADAA): https://www.clark.net/  National Alliance on Mental Illness (NAMI): www.nami.org Contact a health care provider if you:  Have a hard time staying focused or finishing daily tasks.  Spend many hours a day feeling worried about everyday life.  Become exhausted by worry.  Start to have headaches, feel tense, or have nausea.  Urinate more than normal.  Have diarrhea. Get help right away if you have:  A racing heart and shortness of breath.  Thoughts of hurting yourself or others. If you ever feel like you may hurt yourself or others, or have thoughts about taking your own life, get help right away. You can go to your nearest emergency department or call:  Your local emergency services (911 in the [place]  A suicide crisis helpline, such as the [place] at 8598740464. This is open 24 hours a day. Summary  Taking steps to learn and use tension reduction techniques can help calm you and help prevent triggering an anxiety reaction.  When used together, medicines, psychotherapy, and tension reduction techniques may be the most effective treatment.  Family, friends, and partners can play a big part in helping you recover from an anxiety disorder. This information is not intended to replace advice given to you by your health care provider. Make sure you discuss any questions you have with your health care provider. Document Revised: 02/19/2019 Document Reviewed: 02/19/2019 Elsevier Patient Education  [place].  Female Infertility  Female infertility refers to a woman's inability to get pregnant (conceive) after a year of having sex regularly (or after 6 months in women over age 67) without using birth control. Infertility can also mean that a woman is not able to carry a pregnancy to full term. Both women and men can have fertility problems. What are the causes? This  condition may be caused by:  Problems with reproductive organs. Infertility can result if a woman: ? Has an abnormally short cervix or a cervix that does not remain closed during a pregnancy. ? Has a blockage or scarring in the fallopian tubes. ? Has an abnormally shaped uterus. ? Has uterine fibroids. This is a benign mass of tissue or muscle (tumor) that can develop in the uterus. ?  Is not ovulating in a regular way.  Certain medical conditions. These may include: ? Polycystic ovary syndrome (PCOS). This is a hormonal disorder that can cause small cysts to grow on the ovaries. This is the most common cause of infertility in women. ? Endometriosis. This is a condition in which the tissue that lines the uterus (endometrium) grows outside of its normal location. ? Cancer and cancer treatments, such as chemotherapy or radiation. ? Premature ovarian failure. This is when ovaries stop producing eggs and hormones before age 88. ? Sexually transmitted diseases, such as chlamydia or gonorrhea. ? Autoimmune disorders. These are disorders in which the body's defense system (immune system) attacks normal, healthy cells. Infertility can be linked to more than one cause. For some women, the cause of infertility is not known (unexplained infertility). What increases the risk?  Age. A woman's fertility declines with age, especially after her mid-42s.  Being underweight or overweight.  Drinking too much alcohol.  Using drugs such as anabolic steroids, cocaine, and marijuana.  Exercising excessively.  Being exposed to environmental toxins, such as radiation, pesticides, and certain chemicals. What are the signs or symptoms? The main sign of infertility in women is the inability to get pregnant or carry a pregnancy to full term. How is this diagnosed? This condition may be diagnosed by:  Checking whether you are ovulating each month. The tests may include: ? Blood tests to check hormone  levels. ? An ultrasound of the ovaries. ? Taking a small tissue that lines the uterus and checking it under a microscope (endometrial biopsy).  Doing additional tests. This is done if ovulation is normal. Tests may include: ? Hysterosalpingography. This X-ray test can show the shape of the uterus and whether the fallopian tubes are open. ? Laparoscopy. This test uses a lighted tube (laparoscope) to look for problems in the fallopian tubes and other organs. ? Transvaginal ultrasound. This imaging test is used to check for abnormalities in the uterus and ovaries. ? Hysteroscopy. This test uses a lighted tube to check for problems in the cervix and the uterus. To be diagnosed with infertility, both partners will have a physical exam. Both partners will also have an extensive medical and sexual history taken. Additional tests may be done. How is this treated? Treatment depends on the cause of infertility. Most cases of infertility in women are treated with medicine or surgery.  Women may take medicine to: ? Correct ovulation problems. ? Treat other health conditions.  Surgery may be done to: ? Repair damage to the ovaries, fallopian tubes, cervix, or uterus. ? Remove growths from the uterus. ? Remove scar tissue from the uterus, pelvis, or other organs. Assisted reproductive technology (ART) Assisted reproductive technology (ART) refers to all treatments and procedures that combine eggs and sperm outside the body to try to help a couple conceive. ART is often combined with fertility drugs to stimulate ovulation. Sometimes ART is done using eggs retrieved from another woman's body (donor eggs) or from previously frozen fertilized eggs (embryos). There are different types of ART. These include:  Intrauterine insemination (IUI). A long, thin tube is used to place sperm directly into a woman's uterus. This procedure: ? Is effective for infertility caused by sperm problems, including low sperm count  and low motility. ? Can be used in combination with fertility drugs.  In vitro fertilization (IVF). This is done when a woman's fallopian tubes are blocked or when a man has low sperm count. In this procedure: ? Fertility  drugs are used to stimulate the ovaries to produce multiple eggs. ? Once mature, these eggs are removed from the body and combined with the sperm to be fertilized. ? The fertilized eggs are then placed into the woman's uterus. Follow these instructions at home:  Take over-the-counter and prescription medicines only as told by your health care provider.  Do not use any products that contain nicotine or tobacco, such as cigarettes and e-cigarettes. If you need help quitting, ask your health care provider.  If you drink alcohol, limit how much you have to 1 drink a day.  Make dietary changes to lose weight or maintain a healthy weight. Work with your health care provider and a dietitian to set a weight-loss goal that is healthy and reasonable for you.  Seek support from a counselor or support group to talk about your concerns related to infertility. Couples counseling may be helpful for you and your partner.  Practice stress reduction techniques that work well for you, such as regular physical activity, meditation, or deep breathing.  Keep all follow-up visits as told by your health care provider. This is important. Contact a health care provider if you:  Feel that stress is interfering with your life and relationships.  Have side effects from treatments for infertility. Summary  Female infertility refers to a woman's inability to get pregnant (conceive) after a year of having sex regularly (or after 6 months in women over age 75) without using birth control.  To be diagnosed with infertility, both partners will have a physical exam. Both partners will also have an extensive medical and sexual history taken.  Seek support from a counselor or support group to talk about  your concerns related to infertility. Couples counseling may be helpful for you and your partner. This information is not intended to replace advice given to you by your health care provider. Make sure you discuss any questions you have with your health care provider. Document Revised: 01/10/2019 Document Reviewed: 08/21/2017 Elsevier Patient Education  [place].

## 2020-08-06 LAB — CBC WITH DIFFERENTIAL/PLATELET
Absolute Monocytes: 488 cells/uL (ref 200–950)
Basophils Absolute: 31 cells/uL (ref 0–200)
Basophils Relative: 0.5 %
Eosinophils Absolute: 67 cells/uL (ref 15–500)
Eosinophils Relative: 1.1 %
HCT: 39.7 % (ref 35.0–45.0)
Hemoglobin: 12.8 g/dL (ref 11.7–15.5)
Lymphs Abs: 1854 cells/uL (ref 850–3900)
MCH: 24.9 pg — ABNORMAL LOW (ref 27.0–33.0)
MCHC: 32.2 g/dL (ref 32.0–36.0)
MCV: 77.2 fL — ABNORMAL LOW (ref 80.0–100.0)
MPV: 11.5 fL (ref 7.5–12.5)
Monocytes Relative: 8 %
Neutro Abs: 3660 cells/uL (ref 1500–7800)
Neutrophils Relative %: 60 %
Platelets: 312 10*3/uL (ref 140–400)
RBC: 5.14 10*6/uL — ABNORMAL HIGH (ref 3.80–5.10)
RDW: 13.2 % (ref 11.0–15.0)
Total Lymphocyte: 30.4 %
WBC: 6.1 10*3/uL (ref 3.8–10.8)

## 2020-08-06 LAB — COMPLETE METABOLIC PANEL WITH GFR
AG Ratio: 1.6 (calc) (ref 1.0–2.5)
ALT: 10 U/L (ref 6–29)
AST: 13 U/L (ref 10–30)
Albumin: 4.2 g/dL (ref 3.6–5.1)
Alkaline phosphatase (APISO): 53 U/L (ref 31–125)
BUN: 10 mg/dL (ref 7–25)
CO2: 25 mmol/L (ref 20–32)
Calcium: 9.8 mg/dL (ref 8.6–10.2)
Chloride: 107 mmol/L (ref 98–110)
Creat: 0.88 mg/dL (ref 0.50–1.10)
GFR, Est African American: 100 mL/min/{1.73_m2} (ref >=60)
GFR, Est Non African American: 86 mL/min/{1.73_m2} (ref >=60)
Globulin: 2.6 g/dL (calc) (ref 1.9–3.7)
Glucose, Bld: 89 mg/dL (ref 65–99)
Potassium: 4 mmol/L (ref 3.5–5.3)
Sodium: 140 mmol/L (ref 135–146)
Total Bilirubin: 0.3 mg/dL (ref 0.2–1.2)
Total Protein: 6.8 g/dL (ref 6.1–8.1)

## 2020-08-06 LAB — LIPID PANEL
Cholesterol: 187 mg/dL (ref <200)
HDL: 53 mg/dL (ref >=50)
LDL Cholesterol (Calc): 118 mg/dL (calc) — ABNORMAL HIGH
Non-HDL Cholesterol (Calc): 134 mg/dL (calc) — ABNORMAL HIGH (ref <130)
Total CHOL/HDL Ratio: 3.5 (calc) (ref <5.0)
Triglycerides: 70 mg/dL (ref <150)

## 2020-08-06 LAB — T4, FREE: Free T4: 1.2 ng/dL (ref 0.8–1.8)

## 2020-08-06 LAB — HIV ANTIBODY (ROUTINE TESTING W REFLEX): HIV 1&2 Ab, 4th Generation: NONREACTIVE

## 2020-08-06 LAB — RPR: RPR Ser Ql: NONREACTIVE

## 2020-08-06 LAB — HEMOGLOBIN A1C
Hgb A1c MFr Bld: 5.6 % of total Hgb (ref <5.7)
Mean Plasma Glucose: 114 (calc)
eAG (mmol/L): 6.3 (calc)

## 2020-08-06 LAB — TSH: TSH: 1.54 mIU/L

## 2020-08-11 ENCOUNTER — Encounter: Payer: Self-pay | Admitting: Family Medicine

## 2020-08-17 LAB — CYTOLOGY - PAP
Chlamydia: NEGATIVE
Comment: NEGATIVE
Comment: NEGATIVE
Comment: NEGATIVE
Comment: NEGATIVE
Comment: NORMAL
Diagnosis: NEGATIVE
HPV 16: NEGATIVE
HPV 18 / 45: NEGATIVE
High risk HPV: POSITIVE — AB
Neisseria Gonorrhea: NEGATIVE
Trichomonas: NEGATIVE

## 2020-08-18 ENCOUNTER — Encounter: Payer: Self-pay | Admitting: Family Medicine

## 2020-09-03 ENCOUNTER — Ambulatory Visit (INDEPENDENT_AMBULATORY_CARE_PROVIDER_SITE_OTHER): Payer: 59 | Admitting: Obstetrics & Gynecology

## 2020-09-03 ENCOUNTER — Encounter: Payer: Self-pay | Admitting: Obstetrics & Gynecology

## 2020-09-03 ENCOUNTER — Other Ambulatory Visit: Payer: Self-pay

## 2020-09-03 VITALS — BP 139/85 | HR 80 | Ht 66.0 in | Wt 255.0 lb

## 2020-09-03 DIAGNOSIS — G8929 Other chronic pain: Secondary | ICD-10-CM | POA: Diagnosis not present

## 2020-09-03 DIAGNOSIS — R102 Pelvic and perineal pain: Secondary | ICD-10-CM

## 2020-09-03 DIAGNOSIS — N809 Endometriosis, unspecified: Secondary | ICD-10-CM

## 2020-09-03 MED ORDER — NAPROXEN SODIUM 550 MG PO TABS: 550.0000 mg | ORAL_TABLET | Freq: Two times a day (BID) | ORAL | 2 refills | Status: DC

## 2020-09-03 MED ORDER — ORILISSA 200 MG PO TABS: 1.0000 | ORAL_TABLET | Freq: Two times a day (BID) | ORAL | 3 refills | Status: AC

## 2020-09-03 MED ORDER — TRAMADOL HCL 50 MG PO TABS: 50.0000 mg | ORAL_TABLET | Freq: Four times a day (QID) | ORAL | 3 refills | Status: AC | PRN

## 2020-09-03 NOTE — Progress Notes (Signed)
GYNECOLOGY OFFICE VISIT NOTE  History:   Frances Hill is a 34 y.o. G1P0010 here today for follow up of chronic pelvic pain and endometriosis. History of multiple regimens and surgeries in the past. Was referred to Dr. Kerin Perna, he recommended IVF given that patient desires fertility prior to any further surgical intervention. Patient reports she cannot afford this, wants medication for debilitating pain in the interim.  Last tried Orilissa 200 mg po bid, but did not like mood side effects. Willing to try lower dose as this also helped with her bleeding.  Also desires medication for the pain.  She denies any abnormal vaginal discharge or other concerns.    Past Medical History:  Diagnosis Date  . Anxiety   . Cyst of right ovary   . Endometriosis   . Fibroids   . History of ovarian cyst    12-25-2013  s/p removal left ovarian endometrioma  . History of pelvic inflammatory disease   . History of trichomoniasis   . Sickle cell trait ([place])   . UTI (urinary tract infection)     Past Surgical History:  Procedure Laterality Date  . CHROMOPERTUBATION  04/24/2015   Procedure: CHROMOPERTUBATION;  Surgeon: Governor Specking, MD;  Location: [facility];  Service: Gynecology;;  . LAPAROSCOPIC LYSIS OF ADHESIONS  04/24/2015   Procedure: LAPAROSCOPIC LYSIS OF ADHESIONS AND EXCISION ENDOMETRIOTIC IMPLANTS;  Surgeon: Governor Specking, MD;  Location: [facility];  Service: Gynecology;;  . LAPAROSCOPIC OVARIAN CYSTECTOMY Right 04/24/2015   Procedure: LAPAROSCOPIC, RIGHT OVARIAN CYSTECTOMY,  GEL PORT ASSISTED MYOMECTOMY;  Surgeon: Governor Specking, MD;  Location: [facility];  Service: Gynecology;  Laterality: Right;  . LAPAROSCOPY N/A 12/25/2013   Procedure: OPERATIVE LAPAROSCOPY;  Surgeon: Osborne Oman, MD;  Location: [facility];  Service: Gynecology;  Laterality: N/A;  . SALPINGOOPHORECTOMY Left 12/25/2013   Procedure: SALPINGO OOPHORECTOMY;  Surgeon:  Osborne Oman, MD;  Location: [facility];  Service: Gynecology;  Laterality: Left;   The following portions of the patient's history were reviewed and updated as appropriate: allergies, current medications, past family history, past medical history, past social history, past surgical history and problem list.   Health Maintenance:  Normal pap and positive HRHPV on 08/05/2020. Marland Kitchen   Review of Systems:  Pertinent items noted in HPI and remainder of comprehensive ROS otherwise negative.  Physical Exam:  BP 139/85   Pulse 80   Ht 5\' 6"  (1.676 m)   Wt 255 lb (115.7 kg)   LMP 08/07/2020 (Within Days)   BMI 41.16 kg/m  CONSTITUTIONAL: Well-developed, well-nourished female in no acute distress.  HEENT:  Normocephalic, atraumatic. External right and left ear normal. No scleral icterus.  NECK: Normal range of motion, supple, no masses noted on observation SKIN: No rash noted. Not diaphoretic. No erythema. No pallor. MUSCULOSKELETAL: Normal range of motion. No edema noted. NEUROLOGIC: Alert and oriented to person, place, and time. Normal muscle tone coordination. No cranial nerve deficit noted. PSYCHIATRIC: Normal mood and affect. Normal behavior. Normal judgment and thought content. CARDIOVASCULAR: Normal heart rate noted RESPIRATORY: Effort and breath sounds normal, no problems with respiration noted ABDOMEN: No masses noted. No other overt distention noted.   PELVIC: Deferred     Assessment and Plan:      1. Endometriosis 2. Chronic pelvic pain in female Prescribed Orilissa 200 mg po daily (may need prior authorization), will monitor her response. Also prescribed some oral analgesia, cautioned about narcotic dependence. Referred to Physical Therapy, she  said this helped with her pelvic pain in the past.  Reevaluate in one month. - traMADol (ULTRAM) 50 MG tablet; Take 1 tablet (50 mg total) by mouth every 6 (six) hours as needed for moderate pain or severe pain.  Dispense: 30 tablet; Refill:  3 - naproxen sodium (ANAPROX DS) 550 MG tablet; Take 1 tablet (550 mg total) by mouth 2 (two) times daily with a meal. As needed for pain  Dispense: 60 tablet; Refill: 2 - Elagolix Sodium (ORILISSA) 200 MG TABS; Take 1 tablet by mouth 2 (two) times daily.  Dispense: 60 tablet; Refill: 3 - Ambulatory referral to Physical Therapy  Routine preventative health maintenance measures emphasized. Please refer to After Visit Summary for other counseling recommendations.   Return in about 1 month (around 10/04/2020) for Followup (can be virtual).    I spent 20 minutes dedicated to the care of this patient including pre-visit review of records, face to face time with the patient discussing her conditions and treatments and post visit ordering of testing.    Verita Schneiders, MD, [facility] [employer], [place]

## 2020-09-03 NOTE — Patient Instructions (Signed)
Return to clinic for any scheduled appointments or for any gynecologic concerns as needed.   

## 2020-09-17 ENCOUNTER — Ambulatory Visit: Payer: 59 | Attending: Obstetrics & Gynecology | Admitting: Physical Therapy

## 2020-10-06 ENCOUNTER — Telehealth (INDEPENDENT_AMBULATORY_CARE_PROVIDER_SITE_OTHER): Payer: 59 | Admitting: Family Medicine

## 2020-10-06 DIAGNOSIS — N809 Endometriosis, unspecified: Secondary | ICD-10-CM | POA: Diagnosis not present

## 2020-10-06 NOTE — Progress Notes (Signed)
I connected with  Frances Hill on 10/06/20 at  3:15 PM EST by telephone and verified that I am speaking with the correct person using two identifiers.   I discussed the limitations, risks, security and privacy concerns of performing an evaluation and management service by telephone and the availability of in person appointments. I also discussed with the patient that there may be a patient responsible charge related to this service. The patient expressed understanding and agreed to proceed.  Ralene Bathe, RN 10/06/2020  3:18 PM

## 2020-10-06 NOTE — Progress Notes (Signed)
GYNECOLOGY VIRTUAL VISIT ENCOUNTER NOTE  Provider location: Center for [facility]   I connected with Frances Hill on 10/06/20 at  3:15 PM EST by MyChart Video Encounter at home and verified that I am speaking with the correct person using two identifiers.   I discussed the limitations, risks, security and privacy concerns of performing an evaluation and management service virtually and the availability of in person appointments. I also discussed with the patient that there may be a patient responsible charge related to this service. The patient expressed understanding and agreed to proceed.   History:  Frances Hill is a 35 y.o. G2P0010 female being evaluated today for endometriosis.   Patient seen on 09/03/2020 to discuss treatment options for endometriosis Started on Orilissa 200mg  PO daily, needed to get prior auth  Today reports prior auth only recently approved and has only been taking for one week Continues to have unchanged chronic pain Taking naproxen regularly and tramadol occasionally Tramadol works for the pain but only lasts 2-3 hours Still hoping to get pregnant, following w Dr. Dewayne Hatch     Past Medical History:  Diagnosis Date  . Anxiety   . Cyst of right ovary   . Endometriosis   . Fibroids   . History of ovarian cyst    12-25-2013  s/p removal left ovarian endometrioma  . History of pelvic inflammatory disease   . History of trichomoniasis   . Sickle cell trait ([facility])   . UTI (urinary tract infection)    Past Surgical History:  Procedure Laterality Date  . CHROMOPERTUBATION  04/24/2015   Procedure: CHROMOPERTUBATION;  Surgeon: 04/26/2015, MD;  Location: [facility];  Service: Gynecology;;  . LAPAROSCOPIC LYSIS OF ADHESIONS  04/24/2015   Procedure: LAPAROSCOPIC LYSIS OF ADHESIONS AND EXCISION ENDOMETRIOTIC IMPLANTS;  Surgeon: 04/26/2015, MD;  Location: [facility];   Service: Gynecology;;  . LAPAROSCOPIC OVARIAN CYSTECTOMY Right 04/24/2015   Procedure: LAPAROSCOPIC, RIGHT OVARIAN CYSTECTOMY,  GEL PORT ASSISTED MYOMECTOMY;  Surgeon: 04/26/2015, MD;  Location: [facility];  Service: Gynecology;  Laterality: Right;  . LAPAROSCOPY N/A 12/25/2013   Procedure: OPERATIVE LAPAROSCOPY;  Surgeon: 12/27/2013, MD;  Location: [facility];  Service: Gynecology;  Laterality: N/A;  . SALPINGOOPHORECTOMY Left 12/25/2013   Procedure: SALPINGO OOPHORECTOMY;  Surgeon: 12/27/2013, MD;  Location: [facility];  Service: Gynecology;  Laterality: Left;   The following portions of the patient's history were reviewed and updated as appropriate: allergies, current medications, past family history, past medical history, past social history, past surgical history and problem list.   Health Maintenance:  Last pap on 08/05/2020 with NILM and HPV+ (neg HPV 16/18/45), repeat in 1 year.  Normal mammogram on: n/a.   Review of Systems:  Pertinent items noted in HPI and remainder of comprehensive ROS otherwise negative.  Physical Exam:   General:  Alert, oriented and cooperative. Patient appears to be in no acute distress.  Mental Status: Normal mood and affect. Normal behavior. Normal judgment and thought content.   Respiratory: Normal respiratory effort, no problems with respiration noted  Rest of physical exam deferred due to type of encounter  Labs and Imaging No results found for this or any previous visit (from the past 336 hour(s)). No results found.     Assessment and Plan:     Endometriosis      Given patient has only been on Orilissa for one week would  not expect her to have full effects yet. Discussed that we should reassess in one month and consider uptitration or alternative treatment options at that point.  Regarding pain control I am hesitant to trial stronger opioids. Patient is OK with continuing Frances Hill to treat underlying cause and keeping pain  management plan the same for the next month. Encouraged her to cont follow up w fertility clinic.    I discussed the assessment and treatment plan with the patient. The patient was provided an opportunity to ask questions and all were answered. The patient agreed with the plan and demonstrated an understanding of the instructions.   Return in about 4 weeks (around 11/03/2020), or follow up on orlissa treatment for endometriosis.   I provided 12 minutes of face-to-face time during this encounter.   Clarnce Flock, MD Center for [facility], [place]

## 2020-11-03 ENCOUNTER — Ambulatory Visit: Payer: 59 | Attending: Physical Therapy | Admitting: Physical Therapy

## 2020-11-26 ENCOUNTER — Telehealth (INDEPENDENT_AMBULATORY_CARE_PROVIDER_SITE_OTHER): Payer: 59 | Admitting: Family Medicine

## 2020-11-26 ENCOUNTER — Encounter: Payer: Self-pay | Admitting: Family Medicine

## 2020-11-26 ENCOUNTER — Other Ambulatory Visit: Payer: Self-pay

## 2020-11-26 DIAGNOSIS — N809 Endometriosis, unspecified: Secondary | ICD-10-CM | POA: Diagnosis not present

## 2020-11-26 DIAGNOSIS — R1084 Generalized abdominal pain: Secondary | ICD-10-CM

## 2020-11-26 DIAGNOSIS — R1114 Bilious vomiting: Secondary | ICD-10-CM

## 2020-11-26 DIAGNOSIS — R197 Diarrhea, unspecified: Secondary | ICD-10-CM

## 2020-11-26 MED ORDER — ONDANSETRON HCL 4 MG PO TABS: 4.0000 mg | ORAL_TABLET | Freq: Three times a day (TID) | ORAL | 0 refills | Status: DC | PRN

## 2020-11-26 NOTE — Progress Notes (Signed)
Virtual Visit via Video Note  I connected with Frances Hill on 11/26/20 at 11:00 AM EST by a video enabled telemedicine application 2/2 [id number]-22 pandemic and verified that I am speaking with the correct person using two identifiers.  Location patient: home Location provider:work or home office Persons participating in the virtual visit: patient, provider  I discussed the limitations of evaluation and management by telemedicine and the availability of in person appointments. The patient expressed understanding and agreed to proceed.   HPI: Pt is a 35 yo with pmh sig for endometriosis, anxiety, fibroids, depression, h/o PID, PTSD who was seen for ongoing concern.   Pt with n,v, and diarrhea daily x months.  Pt thinks she also has a "stomach bug" for the last few days due to increased symptoms.  Pt took an at home COVID test which was negative.  Dairy, spicy foods, meat (beef) cause stomach pain.  Pt with increased bloating, throwing up stomach acid in the am.  Pt ate a smoothie last night, but it did not stay down.  Endorses antibiotic use a few weeks ago for sinus infection as rx'd by UC.  Pt has endometriosis.  LMP was at the beginning of the month.  Was heavy, had to use a pad and tampon.  Stopped taking [name] as she does not like the way it make her feel.  Taking Tramadol and anaprox ds as rx'd by her OB/Gyn.  Pt frustrated as it is difficult to function when she is in pain.  Pt's other specialist wants her to start fertility treatments, which she cannot afford, and avoid further surgery.  Menses started at age 80, used Aleve and ibuprofen ever since then.    ROS: See pertinent positives and negatives per HPI.  Past Medical History:  Diagnosis Date  . Anxiety   . Cyst of right ovary   . Endometriosis   . Fibroids   . History of ovarian cyst    12-25-2013  s/p removal left ovarian endometrioma  . History of pelvic inflammatory disease   . History of trichomoniasis   . Sickle cell  trait ([place])   . UTI (urinary tract infection)     Past Surgical History:  Procedure Laterality Date  . CHROMOPERTUBATION  04/24/2015   Procedure: CHROMOPERTUBATION;  Surgeon: Governor Specking, MD;  Location: [facility];  Service: Gynecology;;  . LAPAROSCOPIC LYSIS OF ADHESIONS  04/24/2015   Procedure: LAPAROSCOPIC LYSIS OF ADHESIONS AND EXCISION ENDOMETRIOTIC IMPLANTS;  Surgeon: Governor Specking, MD;  Location: [facility];  Service: Gynecology;;  . LAPAROSCOPIC OVARIAN CYSTECTOMY Right 04/24/2015   Procedure: LAPAROSCOPIC, RIGHT OVARIAN CYSTECTOMY,  GEL PORT ASSISTED MYOMECTOMY;  Surgeon: Governor Specking, MD;  Location: [facility];  Service: Gynecology;  Laterality: Right;  . LAPAROSCOPY N/A 12/25/2013   Procedure: OPERATIVE LAPAROSCOPY;  Surgeon: Osborne Oman, MD;  Location: [facility];  Service: Gynecology;  Laterality: N/A;  . SALPINGOOPHORECTOMY Left 12/25/2013   Procedure: SALPINGO OOPHORECTOMY;  Surgeon: Osborne Oman, MD;  Location: [facility];  Service: Gynecology;  Laterality: Left;    Family History  Problem Relation Age of Onset  . Aneurysm Mother 32       died of aneurysm, brain  . Cancer Mother 66       breast, twice  . Hypertension Father   . Depression Father   . Heart disease Father        valve disease  . Other Father  DDD  . Other Brother        DDD  . Sarcoidosis Brother   . Cancer Maternal Aunt        breast cancer  . Diabetes Paternal Grandmother   . Stroke Neg Hx       Current Outpatient Medications:  .  traMADol (ULTRAM) 50 MG tablet, Take 1 tablet (50 mg total) by mouth every 6 (six) hours as needed for moderate pain or severe pain., Disp: 30 tablet, Rfl: 3 .  venlafaxine (EFFEXOR) 75 MG tablet, Take 2 tablets (150 mg total) by mouth daily., Disp: 180 tablet, Rfl: 1 .  Elagolix Sodium (ORILISSA) 200 MG TABS, Take 1 tablet by mouth 2 (two) times daily. (Patient not taking: Reported on 11/26/2020), Disp:  60 tablet, Rfl: 3 .  naproxen sodium (ANAPROX DS) 550 MG tablet, Take 1 tablet (550 mg total) by mouth 2 (two) times daily with a meal. As needed for pain (Patient not taking: Reported on 11/26/2020), Disp: 60 tablet, Rfl: 2  EXAM:  VITALS per patient if applicable:  RR between 12-20 bpm  GENERAL: alert, oriented, appears well and in no acute distress  HEENT: atraumatic, conjunctiva clear, no obvious abnormalities on inspection of external nose and ears  NECK: normal movements of the head and neck  LUNGS: on inspection no signs of respiratory distress, breathing rate appears normal, no obvious gross SOB, gasping or wheezing  CV: no obvious cyanosis  MS: moves all visible extremities without noticeable abnormality  PSYCH/NEURO: pleasant and cooperative, no obvious depression or anxiety, speech and thought processing grossly intact  ASSESSMENT AND PLAN:  Discussed the following assessment and plan:  Diarrhea, unspecified type -Discussed possible causes including IBS -Discussed supportive care and the importance of staying hydrated -Consider low FODMAP diet, Imodium, and probiotic - Plan: Ambulatory referral to Gastroenterology  Generalized abdominal pain  -Likely multifactorial including endometriosis and ongoing diarrhea -Advised to limit use of NSAIDs - Plan: Ambulatory referral to Gastroenterology  Bilious vomiting with nausea -Concern for gastric ulcer given NSAID use. - Plan: Ambulatory referral to Gastroenterology, ondansetron (ZOFRAN) 4 MG tablet  Endometriosis -Continue follow-up with OB/GYN  Given a note for work as patient missed today and yesterday.  Given strict precautions.  Follow-up as needed   I discussed the assessment and treatment plan with the patient. The patient was provided an opportunity to ask questions and all were answered. The patient agreed with the plan and demonstrated an understanding of the instructions.   The patient was advised to call  back or seek an in-person evaluation if the symptoms worsen or if the condition fails to improve as anticipated.   Billie Ruddy, MD

## 2020-11-30 ENCOUNTER — Other Ambulatory Visit: Payer: Self-pay

## 2020-11-30 ENCOUNTER — Encounter (HOSPITAL_BASED_OUTPATIENT_CLINIC_OR_DEPARTMENT_OTHER): Payer: Self-pay

## 2020-11-30 ENCOUNTER — Emergency Department (HOSPITAL_BASED_OUTPATIENT_CLINIC_OR_DEPARTMENT_OTHER)
Admission: EM | Admit: 2020-11-30 | Discharge: 2020-11-30 | Disposition: A | Discharge status: Home / Self Care | Payer: 59 | Attending: Emergency Medicine | Admitting: Emergency Medicine

## 2020-11-30 DIAGNOSIS — R1030 Lower abdominal pain, unspecified: Secondary | ICD-10-CM | POA: Insufficient documentation

## 2020-11-30 DIAGNOSIS — R112 Nausea with vomiting, unspecified: Secondary | ICD-10-CM | POA: Diagnosis not present

## 2020-11-30 DIAGNOSIS — E860 Dehydration: Secondary | ICD-10-CM

## 2020-11-30 DIAGNOSIS — R197 Diarrhea, unspecified: Secondary | ICD-10-CM | POA: Insufficient documentation

## 2020-11-30 LAB — CBC WITH DIFFERENTIAL/PLATELET
Abs Immature Granulocytes: 0.02 10*3/uL (ref 0.00–0.07)
Basophils Absolute: 0 10*3/uL (ref 0.0–0.1)
Basophils Relative: 1 %
Eosinophils Absolute: 0.1 10*3/uL (ref 0.0–0.5)
Eosinophils Relative: 1 %
HCT: 40.4 % (ref 36.0–46.0)
Hemoglobin: 13.1 g/dL (ref 12.0–15.0)
Immature Granulocytes: 0 %
Lymphocytes Relative: 28 %
Lymphs Abs: 2 10*3/uL (ref 0.7–4.0)
MCH: 24.8 pg — ABNORMAL LOW (ref 26.0–34.0)
MCHC: 32.4 g/dL (ref 30.0–36.0)
MCV: 76.4 fL — ABNORMAL LOW (ref 80.0–100.0)
Monocytes Absolute: 0.7 10*3/uL (ref 0.1–1.0)
Monocytes Relative: 9 %
Neutro Abs: 4.4 10*3/uL (ref 1.7–7.7)
Neutrophils Relative %: 61 %
Platelets: 334 10*3/uL (ref 150–400)
RBC: 5.29 MIL/uL — ABNORMAL HIGH (ref 3.87–5.11)
RDW: 14.1 % (ref 11.5–15.5)
WBC: 7.1 10*3/uL (ref 4.0–10.5)
nRBC: 0 % (ref 0.0–0.2)

## 2020-11-30 LAB — URINALYSIS, ROUTINE W REFLEX MICROSCOPIC
Bilirubin Urine: NEGATIVE
Glucose, UA: NEGATIVE mg/dL
Ketones, ur: NEGATIVE mg/dL
Leukocytes,Ua: NEGATIVE
Nitrite: NEGATIVE
Protein, ur: NEGATIVE mg/dL
Specific Gravity, Urine: 1.02 (ref 1.005–1.030)
pH: 5.5 (ref 5.0–8.0)

## 2020-11-30 LAB — COMPREHENSIVE METABOLIC PANEL
ALT: 14 U/L (ref 0–44)
AST: 18 U/L (ref 15–41)
Albumin: 4 g/dL (ref 3.5–5.0)
Alkaline Phosphatase: 41 U/L (ref 38–126)
Anion gap: 10 (ref 5–15)
BUN: 5 mg/dL — ABNORMAL LOW (ref 6–20)
CO2: 24 mmol/L (ref 22–32)
Calcium: 8.9 mg/dL (ref 8.9–10.3)
Chloride: 103 mmol/L (ref 98–111)
Creatinine, Ser: 0.75 mg/dL (ref 0.44–1.00)
GFR, Estimated: >60 mL/min (ref >60)
Glucose, Bld: 89 mg/dL (ref 70–99)
Potassium: 3.5 mmol/L (ref 3.5–5.1)
Sodium: 137 mmol/L (ref 135–145)
Total Bilirubin: 0.3 mg/dL (ref 0.3–1.2)
Total Protein: 7.2 g/dL (ref 6.5–8.1)

## 2020-11-30 LAB — PREGNANCY, URINE: Preg Test, Ur: NEGATIVE

## 2020-11-30 LAB — URINALYSIS, MICROSCOPIC (REFLEX): RBC / HPF: >50 RBC/hpf (ref 0–5)

## 2020-11-30 LAB — LIPASE, BLOOD: Lipase: 21 U/L (ref 11–51)

## 2020-11-30 MED ORDER — ONDANSETRON 4 MG PO TBDP: 4.0000 mg | ORAL_TABLET | Freq: Three times a day (TID) | ORAL | 0 refills | Status: DC | PRN

## 2020-11-30 MED ORDER — SODIUM CHLORIDE 0.9 % IV SOLN: Freq: Once | INTRAVENOUS | Status: AC

## 2020-11-30 MED ORDER — MORPHINE SULFATE (PF) 4 MG/ML IV SOLN
4.0000 mg | Freq: Once | INTRAVENOUS | Status: AC
  Administered 2020-11-30: 4 mg via INTRAVENOUS
  Filled 2020-11-30: qty 1

## 2020-11-30 MED ORDER — ONDANSETRON HCL 4 MG/2ML IJ SOLN
4.0000 mg | Freq: Once | INTRAMUSCULAR | Status: AC
  Administered 2020-11-30: 4 mg via INTRAVENOUS
  Filled 2020-11-30: qty 2

## 2020-11-30 NOTE — ED Triage Notes (Signed)
Pt states that she was recently seen and treated for lower abdominal pain, vomiting, and UTI. States that her endometriosis is "flaring" and causing pain to right side. Pt was diagnosed at ED in [place] on Feb 26th and given antibiotics and Zofran.

## 2020-11-30 NOTE — ED Notes (Signed)
Pt ambulated to restroom. 

## 2020-11-30 NOTE — Discharge Instructions (Signed)
Please use the Zofran you have at home.  I have written you additional prescription if you run out of this.  Please finish the antibiotics you are placed on 2 days ago.  Please drink plenty of water you may also use Pedialyte.  Please follow-up with your OB/GYN.

## 2020-11-30 NOTE — ED Notes (Signed)
Pt was seen in ED out of town on 2/26 diagnosed with UTI, taking medication as prescribed. Pt pt unable to eat since Wednesday.  Having her period now with abdominal cramps.

## 2020-11-30 NOTE — ED Provider Notes (Addendum)
[facility] EMERGENCY DEPARTMENT Provider Note   CSN: [id number] Arrival date & time: 11/30/20  1516     History Chief Complaint  Patient presents with   Emesis    Frances Hill is a 35 y.o. female.  HPI Patient is 35 year old female with past medical history significant for endometriosis, fibroids, ovarian cyst s/p left ovary removal, anxiety, sickle cell trait and UTIs, STDs  Patient is presented today with complaints of lower abdominal pain, nausea, vomiting, diarrhea.  Patient states that she was seen 2 days ago for nausea vomiting diarrhea and was diagnosed with UTI at that time she did also have associated dysuria and urinary frequency and urgency.  She was started on cefdinir states that she has been taking this and it feels that she has been able to keep down her medications.  She denies any back pain or fevers or chills.  She states that she does feel fatigued.  She states she has not been able to keep down any significant meals.  She denies any chest pain or shortness of breath.  She states that she is having 2-4 episodes of loose stool per day for the past 5 days.  She states that her period started 2 days ago and she is now having severe pain that she feels is consistent with her endometriosis.  She denies any vaginal discharge or dyspareunia.  States that she has not actively having sex with anyone over the past few months.  Denies any blood in her stool.  No recent antibiotics apart from the cefdinir that she is presently on.    Past Medical History:  Diagnosis Date   Anxiety    Cyst of right ovary    Endometriosis    Fibroids    History of ovarian cyst    12-25-2013  s/p removal left ovarian endometrioma   History of pelvic inflammatory disease    History of trichomoniasis    Sickle cell trait ([place])    UTI (urinary tract infection)     Patient Active Problem List   Diagnosis Date Noted   History of depression 11/02/2018   Family  history of breast cancer 01/30/2018   Routine general medical examination at a health care facility 10/27/2016   Vaginal discharge 10/27/2016   Screen for STD (sexually transmitted disease) 10/27/2016   Obesity with serious comorbidity 10/27/2016   Influenza vaccination declined 10/27/2016   Encounter for surveillance of contraceptive pills 10/27/2016   Depressed mood 10/27/2016   PTSD (post-traumatic stress disorder) 10/27/2016   Screening for cervical cancer 10/27/2016   Frequent headaches 09/30/2015   Generalized anxiety disorder 09/30/2015   Insomnia 09/30/2015   Chronic pelvic pain in female 09/30/2015   Female fertility problem 09/30/2015   Endometriosis of ovary 04/24/2015   Dyspareunia 04/22/2015   Cyst of right ovary 04/22/2015   Endometriosis 01/14/2014   Ovarian cyst 12/11/2013    Past Surgical History:  Procedure Laterality Date   CHROMOPERTUBATION  04/24/2015   Procedure: CHROMOPERTUBATION;  Surgeon: Governor Specking, MD;  Location: [facility];  Service: Gynecology;;   LAPAROSCOPIC LYSIS OF ADHESIONS  04/24/2015   Procedure: LAPAROSCOPIC LYSIS OF ADHESIONS AND EXCISION ENDOMETRIOTIC IMPLANTS;  Surgeon: Governor Specking, MD;  Location: [facility];  Service: Gynecology;;   LAPAROSCOPIC OVARIAN CYSTECTOMY Right 04/24/2015   Procedure: LAPAROSCOPIC, RIGHT OVARIAN CYSTECTOMY,  GEL PORT ASSISTED MYOMECTOMY;  Surgeon: Governor Specking, MD;  Location: [facility];  Service: Gynecology;  Laterality: Right;   LAPAROSCOPY N/A  12/25/2013   Procedure: OPERATIVE LAPAROSCOPY;  Surgeon: Osborne Oman, MD;  Location: [facility];  Service: Gynecology;  Laterality: N/A;   SALPINGOOPHORECTOMY Left 12/25/2013   Procedure: SALPINGO OOPHORECTOMY;  Surgeon: Osborne Oman, MD;  Location: [facility];  Service: Gynecology;  Laterality: Left;     OB History    Gravida  1   Para  0   Term  0   Preterm  0   AB  1   Living   0     SAB  0   IAB  0   Ectopic  1   Multiple  0   Live Births              Family History  Problem Relation Age of Onset   Aneurysm Mother 49       died of aneurysm, brain   Cancer Mother 38       breast, twice   Hypertension Father    Depression Father    Heart disease Father        valve disease   Other Father        DDD   Other Brother        DDD   Sarcoidosis Brother    Cancer Maternal Aunt        breast cancer   Diabetes Paternal Grandmother    Stroke Neg Hx     Social History   Tobacco Use   Smoking status: Never Smoker   Smokeless tobacco: Never Used  Scientific laboratory technician Use: Never used  Substance Use Topics   Alcohol use: Yes    Comment: occasionally/socially   Drug use: No    Home Medications Prior to Admission medications   Medication Sig Start Date End Date Taking? Authorizing Provider  cefdinir (OMNICEF) 300 MG capsule Take 300 mg by mouth 2 (two) times daily.   Yes [provider]  ondansetron (ZOFRAN ODT) 4 MG disintegrating tablet Take 1 tablet (4 mg total) by mouth every 8 (eight) hours as needed for nausea or vomiting. 11/30/20  Yes Chontel Warning S, PA  ondansetron (ZOFRAN) 4 MG tablet Take 1 tablet (4 mg total) by mouth every 8 (eight) hours as needed for nausea or vomiting. 11/26/20  Yes Billie Ruddy, MD  venlafaxine (EFFEXOR) 75 MG tablet Take 2 tablets (150 mg total) by mouth daily. 08/05/20  Yes Billie Ruddy, MD  Elagolix Sodium (ORILISSA) 200 MG TABS Take 1 tablet by mouth 2 (two) times daily. Patient not taking: No sig reported 09/03/20   Anyanwu, Sallyanne Havers, MD  naproxen sodium (ANAPROX DS) 550 MG tablet Take 1 tablet (550 mg total) by mouth 2 (two) times daily with a meal. As needed for pain Patient not taking: No sig reported 09/03/20   Anyanwu, Sallyanne Havers, MD  traMADol (ULTRAM) 50 MG tablet Take 1 tablet (50 mg total) by mouth every 6 (six) hours as needed for moderate pain or severe pain. 09/03/20    Osborne Oman, MD    Allergies    Patient has no known allergies.  Review of Systems   Review of Systems  Constitutional: Positive for fatigue. Negative for chills and fever.  HENT: Negative for congestion.   Eyes: Negative for pain.  Respiratory: Negative for cough and shortness of breath.   Cardiovascular: Negative for chest pain and leg swelling.  Gastrointestinal: Positive for abdominal pain, diarrhea, nausea and vomiting.  Genitourinary: Positive for vaginal bleeding (On menstrual cycle). Negative  for dysuria and hematuria.  Musculoskeletal: Negative for myalgias.  Skin: Negative for rash.  Neurological: Negative for dizziness and headaches.    Physical Exam Updated Vital Signs BP 118/89 (BP Location: Left Arm)    Pulse 71    Temp 98.8 F (37.1 C) (Oral)    Resp 17    Ht 5\' 6"  (1.676 m)    Wt 113.4 kg    LMP 11/25/2020    SpO2 100%    BMI 40.35 kg/m   Physical Exam Vitals and nursing note reviewed.  Constitutional:      General: She is not in acute distress.    Appearance: She is obese.     Comments: Pleasant well-appearing 11 -year-old.  In no acute distress.  Sitting comfortably in bed.  Able answer questions appropriately follow commands. No increased work of breathing. Speaking in full sentences.  HENT:     Head: Normocephalic and atraumatic.     Nose: Nose normal.  Eyes:     General: No scleral icterus. Cardiovascular:     Rate and Rhythm: Normal rate and regular rhythm.     Pulses: Normal pulses.     Heart sounds: Normal heart sounds.  Pulmonary:     Effort: Pulmonary effort is normal. No respiratory distress.     Breath sounds: No wheezing.  Abdominal:     Palpations: Abdomen is soft.     Tenderness: There is no abdominal tenderness.     Comments: Abdomen is soft nontender with no guarding or rebound.  No palpable masses.  No CVA tenderness.  No hernias palpated.  Abdomen is obese.  Genitourinary:    Comments: Deferred Musculoskeletal:     Cervical  back: Normal range of motion.     Right lower leg: No edema.     Left lower leg: No edema.  Skin:    General: Skin is warm and dry.     Capillary Refill: Capillary refill takes less than 2 seconds.  Neurological:     Mental Status: She is alert. Mental status is at baseline.  Psychiatric:        Mood and Affect: Mood normal.        Behavior: Behavior normal.     ED Results / Procedures / Treatments   Labs (all labs ordered are listed, but only abnormal results are displayed) Labs Reviewed  CBC WITH DIFFERENTIAL/PLATELET - Abnormal; Notable for the following components:      Result Value   RBC 5.29 (*)    MCV 76.4 (*)    MCH 24.8 (*)    All other components within normal limits  COMPREHENSIVE METABOLIC PANEL - Abnormal; Notable for the following components:   BUN 5 (*)    All other components within normal limits  URINALYSIS, ROUTINE W REFLEX MICROSCOPIC - Abnormal; Notable for the following components:   APPearance CLOUDY (*)    Hgb urine dipstick LARGE (*)    All other components within normal limits  URINALYSIS, MICROSCOPIC (REFLEX) - Abnormal; Notable for the following components:   Bacteria, UA RARE (*)    All other components within normal limits  URINE CULTURE  LIPASE, BLOOD  PREGNANCY, URINE    EKG None  Radiology No results found.  Procedures Procedures   Medications Ordered in ED Medications  0.9 %  sodium chloride infusion ( Intravenous New Bag/Given 11/30/20 1712)  morphine 4 MG/ML injection 4 mg (4 mg Intravenous Given 11/30/20 1712)  ondansetron (ZOFRAN) injection 4 mg (4 mg Intravenous Given 11/30/20  Vestalis.Rams)    ED Course  I have reviewed the triage vital signs and the nursing notes.  Pertinent labs & imaging results that were available during my care of the patient were reviewed by me and considered in my medical decision making (see chart for details).    MDM Rules/Calculators/A&P                          Patient is 35 year old female  presented today with abdominal pain.  She states that she has had some nausea vomiting diarrhea for the past 5 days or so was treated for UTI with cefdinir which she continues to take.  She states that her nares and any symptoms have improved at this time she is primarily experiencing symptoms consistent with her normal menses/normal endometriosis pain.  She states that her period started several days ago.  Physical exam is notable for no abdominal tenderness. Patient deferred GU exam  Patient symptoms completely resolved after IV Zofran, small dose of morphine and fluids.  Urinalysis is unremarkable apart from hemoglobin likely secondary to vaginal bleeding.  Rare bacteria no leukocytes or nitrates.  She will continue her antibiotics.  Lipase minimal less than pancreatitis.  CBC without leukocytosis or anemia.  She does have elevated RBC concerns of dehydration.  CMP without abnormality.  On my reevaluation patient continues to have no vomiting or abdominal tenderness. P.o. challenge successfully.  Patient discharged this time with follow-up of her PCP and OB/GYN.  Final Clinical Impression(s) / ED Diagnoses Final diagnoses:  Non-intractable vomiting with nausea, unspecified vomiting type  Dehydration    Rx / DC Orders ED Discharge Orders         Ordered    ondansetron (ZOFRAN ODT) 4 MG disintegrating tablet  Every 8 hours PRN        11/30/20 1805           Tedd Sias, [place] 11/30/20 1830    Tedd Sias, [place] 11/30/20 1831    Isla Pence, MD 11/30/20 [phone]

## 2020-12-02 ENCOUNTER — Telehealth: Payer: Self-pay | Admitting: *Deleted

## 2020-12-02 LAB — URINE CULTURE: Culture: NO GROWTH

## 2020-12-02 NOTE — Telephone Encounter (Signed)
Pt left message stating she has many concerns. She states that she is having stabbing pain in her vagina - internally. She also feels she is having a flare up of pain in her Rt abdomen due to endometriosis. Pt has concerns regarding last Pap which was abnormal due to HPV. Her periods are heavier also and this is concerning. She would like to speak to someone and to schedule appt.

## 2020-12-03 NOTE — Telephone Encounter (Signed)
Returned patients call. Patient did not answer and mailbox was full and not able to leave a message.    Per chart review, patient was to return to the office in February for follow up. Appointment is not noted in the chart.   Will send message to front office to call and schedule follow up appointment. Will send My Chart message to patient as was not able to reach her.

## 2020-12-10 ENCOUNTER — Encounter: Payer: Self-pay | Admitting: Gastroenterology

## 2020-12-16 ENCOUNTER — Encounter: Payer: Self-pay | Admitting: Family Medicine

## 2020-12-16 ENCOUNTER — Other Ambulatory Visit: Payer: Self-pay

## 2020-12-16 ENCOUNTER — Ambulatory Visit (INDEPENDENT_AMBULATORY_CARE_PROVIDER_SITE_OTHER): Payer: 59 | Admitting: Family Medicine

## 2020-12-16 VITALS — BP 131/99 | HR 83 | Ht 66.0 in | Wt 249.0 lb

## 2020-12-16 DIAGNOSIS — B977 Papillomavirus as the cause of diseases classified elsewhere: Secondary | ICD-10-CM

## 2020-12-16 DIAGNOSIS — D259 Leiomyoma of uterus, unspecified: Secondary | ICD-10-CM | POA: Diagnosis not present

## 2020-12-16 DIAGNOSIS — N809 Endometriosis, unspecified: Secondary | ICD-10-CM | POA: Diagnosis not present

## 2020-12-16 NOTE — Assessment & Plan Note (Signed)
Discussed rationale and method of screening for cervical cancer with pap smear, relationship of HPV to cervical cancer. Discussed that 80-90% of patients will clear infection on their own within one year, so given lack of abnormal cells this is the reason for one year repeat of co-testing. All questions answered.

## 2020-12-16 NOTE — Assessment & Plan Note (Signed)
Ongoing pain from endometriosis and fibroid uterus, not currently taking any treatments and still undecided as to whether or not to pursue treatment with fertility doctor. Review of past imaging shows she has pedunculated posterior fibroid. Bimanual exam notable for firm mass palpated through R vaginal wall and R cul de sac, suspect that this fibroid is prolapsing into this space and causing her discomfort. Discussed that unfortunately she has no good choices, as continuing her fertility treatments will mean not addressing her endometriosis and addressing her pain will affect her fertility. She is currently undecided as to what to do though she has considered hysterectomy, but does not want to make a decision at this time. She will contact our office if she decides to proceed with treatment for endometriosis/fibroids.

## 2020-12-16 NOTE — Progress Notes (Signed)
GYNECOLOGY OFFICE VISIT NOTE  History:   Frances Hill is a 35 y.o. G1P0010 here today for follow up of endometriosis.  Patient seen for virtual visit on 10/06/2020, at that time seen for the same issue but had only been on Orlissa for one week. Given there had not been enough time to evaluate the efficacy we decided to schedule a future visit once she had had a good trial of the medication.  Today reports she stopped taking Orlissa about a week after our last visit Went to ED for pelvic pain and told to f/u, that's why she came in Continues to have sharp stabbing pain in her vagina that preceds our last visit and does not seem to be related to her menstrual cycle Feels similar to when she had a cyst on her L ovary (subsequently removed)  Also concerned about her pap smear with +HPV  Past Medical History:  Diagnosis Date  . Anxiety   . Cyst of right ovary   . Endometriosis   . Fibroids   . History of ovarian cyst    12-25-2013  s/p removal left ovarian endometrioma  . History of pelvic inflammatory disease   . History of trichomoniasis   . Sickle cell trait ([place])   . UTI (urinary tract infection)     Past Surgical History:  Procedure Laterality Date  . CHROMOPERTUBATION  04/24/2015   Procedure: CHROMOPERTUBATION;  Surgeon: Governor Specking, MD;  Location: [facility];  Service: Gynecology;;  . LAPAROSCOPIC LYSIS OF ADHESIONS  04/24/2015   Procedure: LAPAROSCOPIC LYSIS OF ADHESIONS AND EXCISION ENDOMETRIOTIC IMPLANTS;  Surgeon: Governor Specking, MD;  Location: [facility];  Service: Gynecology;;  . LAPAROSCOPIC OVARIAN CYSTECTOMY Right 04/24/2015   Procedure: LAPAROSCOPIC, RIGHT OVARIAN CYSTECTOMY,  GEL PORT ASSISTED MYOMECTOMY;  Surgeon: Governor Specking, MD;  Location: [facility];  Service: Gynecology;  Laterality: Right;  . LAPAROSCOPY N/A 12/25/2013   Procedure: OPERATIVE LAPAROSCOPY;  Surgeon: Osborne Oman, MD;  Location: [facility];  Service: Gynecology;  Laterality: N/A;  . SALPINGOOPHORECTOMY Left 12/25/2013   Procedure: SALPINGO OOPHORECTOMY;  Surgeon: Osborne Oman, MD;  Location: [facility];  Service: Gynecology;  Laterality: Left;    The following portions of the patient's history were reviewed and updated as appropriate: allergies, current medications, past family history, past medical history, past social history, past surgical history and problem list.   Health Maintenance:  Normal pap and negative HRHPV: 08/05/2020 NILM w +HPV, repeat in 1 year.  Normal mammogram: n/a.   Review of Systems:  Pertinent items noted in HPI and remainder of comprehensive ROS otherwise negative.  Physical Exam:  BP (!) 131/99 (BP Location: Right Arm, Patient Position: Sitting, Cuff Size: Large)   Pulse 83   Ht 5\' 6"  (1.676 m)   Wt 249 lb (112.9 kg)   LMP 11/09/2020 (LMP Unknown)   BMI 40.19 kg/m  CONSTITUTIONAL: Well-developed, well-nourished female in no acute distress.  HEENT:  Normocephalic, atraumatic. External right and left ear normal. No scleral icterus.  NECK: Normal range of motion, supple, no masses noted on observation SKIN: No rash noted. Not diaphoretic. No erythema. No pallor. MUSCULOSKELETAL: Normal range of motion. No edema noted. NEUROLOGIC: Alert and oriented to person, place, and time. Normal muscle tone coordination.  PSYCHIATRIC: Normal mood and affect. Normal behavior. Normal judgment and thought content. RESPIRATORY: Effort normal, no problems with respiration noted ABDOMEN: No masses noted. No other overt distention noted.   PELVIC: Normal appearing  external genitalia; normal appearing vaginal mucosa and cervix.  No abnormal discharge noted.  Enlarged uterus, firm fullness felt through R vaginal wall  And around to R cul de sac, L side unremarkable.  Labs and Imaging No results found for this or any previous visit (from the past 168 hour(s)). No results found.    Assessment and Plan:   Problem  List Items Addressed This Visit      Genitourinary   Fibroid uterus    Ongoing pain from endometriosis and fibroid uterus, not currently taking any treatments and still undecided as to whether or not to pursue treatment with fertility doctor. Review of past imaging shows she has pedunculated posterior fibroid. Bimanual exam notable for firm mass palpated through R vaginal wall and R cul de sac, suspect that this fibroid is prolapsing into this space and causing her discomfort. Discussed that unfortunately she has no good choices, as continuing her fertility treatments will mean not addressing her endometriosis and addressing her pain will affect her fertility. She is currently undecided as to what to do though she has considered hysterectomy, but does not want to make a decision at this time. She will contact our office if she decides to proceed with treatment for endometriosis/fibroids.         Other   Endometriosis - Primary   HPV (human papilloma virus) infection    Discussed rationale and method of screening for cervical cancer with pap smear, relationship of HPV to cervical cancer. Discussed that 80-90% of patients will clear infection on their own within one year, so given lack of abnormal cells this is the reason for one year repeat of co-testing. All questions answered.          Routine preventative health maintenance measures emphasized. Please refer to After Visit Summary for other counseling recommendations.   Return if symptoms worsen or fail to improve, for endometriosis.    Total face-to-face time with patient: 20 minutes.  Over 50% of encounter was spent on counseling and coordination of care.   Clarnce Flock, MD/[facility] [employer] [facility], [place]

## 2020-12-24 ENCOUNTER — Ambulatory Visit: Payer: 59 | Admitting: Gastroenterology

## 2021-01-04 ENCOUNTER — Other Ambulatory Visit: Payer: Self-pay

## 2021-01-07 ENCOUNTER — Ambulatory Visit: Payer: 59 | Admitting: Physician Assistant

## 2021-03-15 ENCOUNTER — Ambulatory Visit: Payer: 59 | Admitting: Family Medicine

## 2021-03-17 ENCOUNTER — Ambulatory Visit: Payer: 59 | Admitting: Family Medicine

## 2021-03-17 DIAGNOSIS — Z0289 Encounter for other administrative examinations: Secondary | ICD-10-CM

## 2021-03-25 ENCOUNTER — Ambulatory Visit: Payer: 59 | Admitting: Family Medicine

## 2021-03-25 ENCOUNTER — Other Ambulatory Visit: Payer: Self-pay

## 2021-03-25 ENCOUNTER — Encounter: Payer: Self-pay | Admitting: Family Medicine

## 2021-03-25 ENCOUNTER — Other Ambulatory Visit (HOSPITAL_COMMUNITY)
Admission: RE | Admit: 2021-03-25 | Discharge: 2021-03-25 | Disposition: A | Discharge status: Home / Self Care | Payer: 59 | Source: Ambulatory Visit | Attending: Family Medicine | Admitting: Family Medicine

## 2021-03-25 VITALS — BP 128/90 | HR 70 | Temp 98.3°F | Wt 249.6 lb

## 2021-03-25 DIAGNOSIS — N809 Endometriosis, unspecified: Secondary | ICD-10-CM

## 2021-03-25 DIAGNOSIS — Z113 Encounter for screening for infections with a predominantly sexual mode of transmission: Secondary | ICD-10-CM

## 2021-03-25 DIAGNOSIS — N898 Other specified noninflammatory disorders of vagina: Secondary | ICD-10-CM

## 2021-03-25 NOTE — Progress Notes (Signed)
   Subjective:    Patient ID: Frances Hill, female    DOB: [birth year], 35 y.o.   MRN: [id number]  Chief Complaint  Patient presents with   Exposure to STD    Routine testing    HPI Patient was seen today for sti screen. LMP 2 wks ago.  Note increased pain and menses lasting longer.  Now 7 day, was 4-5 days.  Also with a heaviness/pelvic fullness, bloated feeling 2/2 endometriosis.  Request Tramadol refill.  States pap due.  Per chart review, seen by OB/Gyn 12/16/20, for fibroid, endometriosis, and h/o HPV + pap.   Repeat pap advised in 1 yr with co-testing.  Pt considering options regarding fertility treatments vs treating endometriosis and fibroids.  Past Medical History:  Diagnosis Date   Anxiety    Cyst of right ovary    Endometriosis    Fibroids    History of ovarian cyst    12-25-2013  s/p removal left ovarian endometrioma   History of pelvic inflammatory disease    History of trichomoniasis    Sickle cell trait ([facility])    UTI (urinary tract infection)     No Known Allergies  ROS General: Denies fever, chills, night sweats, changes in weight, changes in appetite HEENT: Denies headaches, ear pain, changes in vision, rhinorrhea, sore throat CV: Denies CP, palpitations, SOB, orthopnea Pulm: Denies SOB, cough, wheezing GI: Denies abdominal pain, nausea, vomiting, diarrhea, constipation  +bloating GU: Denies dysuria, hematuria, frequency, vaginal discharge +painful, heavy, prolonged menses.  Pelvic heaviness Msk: Denies muscle cramps, joint pains Neuro: Denies weakness, numbness, tingling Skin: Denies rashes, bruising Psych: Denies depression, anxiety, hallucinations    Objective:    Blood pressure 128/90, pulse 70, temperature 98.3 F (36.8 C), temperature source Oral, weight 249 lb 9.6 oz (113.2 kg), SpO2 97 %, unknown if currently breastfeeding.  Gen. Pleasant, well-nourished, in no distress, normal affect   HEENT: [place]/AT, face symmetric, conjunctiva clear, no  scleral icterus, PERRLA, EOMI, nares patent without drainage Lungs: no accessory muscle use Cardiovascular: RRR, no peripheral edema GU: aptima self swab collected Abdomen: BS present, soft, NT/ND Musculoskeletal: No deformities, no cyanosis or clubbing, normal tone Neuro:  A&Ox3, CN II-XII intact, normal gait Skin:  Warm, no lesions/ rash  Wt Readings from Last 3 Encounters:  03/25/21 249 lb 9.6 oz (113.2 kg)  12/16/20 249 lb (112.9 kg)  11/30/20 250 lb (113.4 kg)    Lab Results  Component Value Date   WBC 7.1 11/30/2020   HGB 13.1 11/30/2020   HCT 40.4 11/30/2020   PLT 334 11/30/2020   GLUCOSE 89 11/30/2020   CHOL 187 08/05/2020   TRIG 70 08/05/2020   HDL 53 08/05/2020   LDLCALC 118 (H) 08/05/2020   ALT 14 11/30/2020   AST 18 11/30/2020   NA 137 11/30/2020   K 3.5 11/30/2020   CL 103 11/30/2020   CREATININE 0.75 11/30/2020   BUN 5 (L) 11/30/2020   CO2 24 11/30/2020   TSH 1.54 08/05/2020   HGBA1C 5.6 08/05/2020    Assessment/Plan:  Routine screening for STI (sexually transmitted infection)  -aptima self swab collected -Advised repeat pap with co-testing due in Nov 2022 2/2 h/o HPV + pap 08/05/20 - Plan: Cervicovaginal ancillary only, RPR, HIV Antibody (routine testing w rflx)  Vaginal discharge  - Plan: Cervicovaginal ancillary only  Endometriosis -reviewed various treatment options -Tramadol refills through OB/Gyn  -continue f/u with OB/Gyn  F/u prn  Grier Mitts, MD

## 2021-03-26 ENCOUNTER — Other Ambulatory Visit: Payer: Self-pay | Admitting: Family Medicine

## 2021-03-26 DIAGNOSIS — B9689 Other specified bacterial agents as the cause of diseases classified elsewhere: Secondary | ICD-10-CM

## 2021-03-26 DIAGNOSIS — N760 Acute vaginitis: Secondary | ICD-10-CM

## 2021-03-26 LAB — CERVICOVAGINAL ANCILLARY ONLY
Bacterial Vaginitis (gardnerella): POSITIVE — AB
Candida Glabrata: NEGATIVE
Candida Vaginitis: NEGATIVE
Chlamydia: NEGATIVE
Comment: NEGATIVE
Comment: NEGATIVE
Comment: NEGATIVE
Comment: NEGATIVE
Comment: NEGATIVE
Comment: NORMAL
Neisseria Gonorrhea: NEGATIVE
Trichomonas: NEGATIVE

## 2021-03-26 LAB — HIV ANTIBODY (ROUTINE TESTING W REFLEX): HIV 1&2 Ab, 4th Generation: NONREACTIVE

## 2021-03-26 LAB — RPR: RPR Ser Ql: NONREACTIVE

## 2021-03-26 MED ORDER — METRONIDAZOLE 500 MG PO TABS: 500.0000 mg | ORAL_TABLET | Freq: Two times a day (BID) | ORAL | 0 refills | Status: AC

## 2021-04-10 ENCOUNTER — Encounter: Payer: Self-pay | Admitting: Family Medicine

## 2021-07-09 ENCOUNTER — Encounter: Payer: Self-pay | Admitting: Radiology

## 2021-07-15 ENCOUNTER — Other Ambulatory Visit: Payer: Self-pay | Admitting: Family Medicine

## 2021-07-15 ENCOUNTER — Telehealth: Payer: Self-pay

## 2021-07-15 DIAGNOSIS — F411 Generalized anxiety disorder: Secondary | ICD-10-CM

## 2021-07-15 DIAGNOSIS — F321 Major depressive disorder, single episode, moderate: Secondary | ICD-10-CM

## 2021-07-15 MED ORDER — VENLAFAXINE HCL 75 MG PO TABS: 150.0000 mg | ORAL_TABLET | Freq: Every day | ORAL | 1 refills | Status: DC

## 2021-07-15 NOTE — Telephone Encounter (Signed)
Patient called requesting Rx refill  venlafaxine (EFFEXOR) 75 MG tablet

## 2021-07-15 NOTE — Addendum Note (Signed)
Addended by: Rebecca Eaton on: 07/15/2021 01:14 PM   Modules accepted: Orders

## 2021-07-15 NOTE — Telephone Encounter (Signed)
Rx has been sent to the pharmacy

## 2021-07-27 ENCOUNTER — Other Ambulatory Visit: Payer: Self-pay

## 2021-07-28 ENCOUNTER — Ambulatory Visit: Payer: 59 | Admitting: Family Medicine

## 2021-07-28 ENCOUNTER — Encounter: Payer: Self-pay | Admitting: Family Medicine

## 2021-07-28 VITALS — BP 130/100 | HR 95 | Temp 98.2°F | Wt 252.2 lb

## 2021-07-28 DIAGNOSIS — I10 Essential (primary) hypertension: Secondary | ICD-10-CM | POA: Diagnosis not present

## 2021-07-28 DIAGNOSIS — F32A Depression, unspecified: Secondary | ICD-10-CM

## 2021-07-28 DIAGNOSIS — R1030 Lower abdominal pain, unspecified: Secondary | ICD-10-CM | POA: Diagnosis not present

## 2021-07-28 DIAGNOSIS — N939 Abnormal uterine and vaginal bleeding, unspecified: Secondary | ICD-10-CM

## 2021-07-28 DIAGNOSIS — D219 Benign neoplasm of connective and other soft tissue, unspecified: Secondary | ICD-10-CM

## 2021-07-28 DIAGNOSIS — R002 Palpitations: Secondary | ICD-10-CM

## 2021-07-28 DIAGNOSIS — N809 Endometriosis, unspecified: Secondary | ICD-10-CM

## 2021-07-28 DIAGNOSIS — F419 Anxiety disorder, unspecified: Secondary | ICD-10-CM

## 2021-07-28 LAB — CBC WITH DIFFERENTIAL/PLATELET
Basophils Absolute: 0.1 10*3/uL (ref 0.0–0.1)
Basophils Relative: 0.7 % (ref 0.0–3.0)
Eosinophils Absolute: 0.1 10*3/uL (ref 0.0–0.7)
Eosinophils Relative: 1 % (ref 0.0–5.0)
HCT: 39.7 % (ref 36.0–46.0)
Hemoglobin: 12.7 g/dL (ref 12.0–15.0)
Lymphocytes Relative: 25.5 % (ref 12.0–46.0)
Lymphs Abs: 2.1 10*3/uL (ref 0.7–4.0)
MCHC: 32 g/dL (ref 30.0–36.0)
MCV: 74.8 fl — ABNORMAL LOW (ref 78.0–100.0)
Monocytes Absolute: 0.6 10*3/uL (ref 0.1–1.0)
Monocytes Relative: 8 % (ref 3.0–12.0)
Neutro Abs: 5.2 10*3/uL (ref 1.4–7.7)
Neutrophils Relative %: 64.8 % (ref 43.0–77.0)
Platelets: 361 10*3/uL (ref 150.0–400.0)
RBC: 5.3 Mil/uL — ABNORMAL HIGH (ref 3.87–5.11)
RDW: 15.3 % (ref 11.5–15.5)
WBC: 8.1 10*3/uL (ref 4.0–10.5)

## 2021-07-28 LAB — COMPREHENSIVE METABOLIC PANEL
ALT: 11 U/L (ref 0–35)
AST: 16 U/L (ref 0–37)
Albumin: 3.7 g/dL (ref 3.5–5.2)
Alkaline Phosphatase: 48 U/L (ref 39–117)
BUN: 7 mg/dL (ref 6–23)
CO2: 28 mEq/L (ref 19–32)
Calcium: 8.6 mg/dL (ref 8.4–10.5)
Chloride: 106 mEq/L (ref 96–112)
Creatinine, Ser: 0.99 mg/dL (ref 0.40–1.20)
GFR: 74.64 mL/min (ref >60.00)
Glucose, Bld: 90 mg/dL (ref 70–99)
Potassium: 3.6 mEq/L (ref 3.5–5.1)
Sodium: 140 mEq/L (ref 135–145)
Total Bilirubin: 0.3 mg/dL (ref 0.2–1.2)
Total Protein: 6.4 g/dL (ref 6.0–8.3)

## 2021-07-28 LAB — TSH: TSH: 1.3 u[IU]/mL (ref 0.35–5.50)

## 2021-07-28 LAB — T4, FREE: Free T4: 1 ng/dL (ref 0.60–1.60)

## 2021-07-28 MED ORDER — NIFEDIPINE ER OSMOTIC RELEASE 30 MG PO TB24: 30.0000 mg | ORAL_TABLET | Freq: Every day | ORAL | 2 refills | Status: DC

## 2021-07-28 MED ORDER — KETOROLAC TROMETHAMINE 60 MG/2ML IM SOLN
60.0000 mg | Freq: Once | INTRAMUSCULAR | Status: AC
  Administered 2021-07-28: 15:00:00 60 mg via INTRAMUSCULAR

## 2021-07-28 NOTE — Progress Notes (Signed)
Subjective:    Patient ID: Frances Hill, female    DOB: [birth year], 35 y.o.   MRN: [id number]  Chief Complaint  Patient presents with   Pain    Pelvic pain and pressure, about a week. Cyle has come on twice this month, used about 10 pads on Sunday. Came on end of September and lasted until 10/3 or 4, came back on 10/20 and is still on. Has taken aleve, GYN prescribed tramodol, nothing is helping OTC    HPI Patient is a 35 yo female with pmh sig for endometriosis, fibroids, anxiety, h/o ovarian cyst, h/o depression who was seen today for ongoing concern.  Pt endorses increased vaginal bleeding, cramping, and sharp/stabbing abd pain x months.  Having palpitations.  Endorses bleeding through her clothes and using 10 pads and tampons in a day earlier this wk.  Followed by OB/Gyn, but next appt is not until Dec.  Pt notes menses Sept 27-Oct 4, then restarted Thursday 10/20.  Pt taking several Aleeve at a time. Missing work 2/2 pain. In the past Tramadol and Mobic were not helpful.  Pt also followed by fertility specialist as she would like to conceive.  Pt given hormonal options such as birth control to help with menses, but declines.  Also advised fertility medications likely to worsen pelvic pain/menstrual symptoms.  Pt did not want to go to ED 2/2 cost.  Continued BP elevation.  Taking venlafaxine 150 mg daily.  Past Medical History:  Diagnosis Date   Anxiety    Cyst of right ovary    Endometriosis    Fibroids    History of ovarian cyst    12-25-2013  s/p removal left ovarian endometrioma   History of pelvic inflammatory disease    History of trichomoniasis    Sickle cell trait (HCC)    UTI (urinary tract infection)     No Known Allergies  ROS General: Denies fever, chills, night sweats, changes in weight, changes in appetite HEENT: Denies headaches, ear pain, changes in vision, rhinorrhea, sore throat CV: Denies CP, SOB, orthopnea +palpitations Pulm: Denies SOB, cough,  wheezing GI: Denies vomiting, diarrhea, constipation +abdominal and pelvic pain/menstrual cramps, nausea, bloating GU: Denies dysuria, hematuria, frequency, vaginal discharge  +heavy menstrual bleeding Msk: Denies muscle cramps, joint pains Neuro: Denies weakness, numbness, tingling Skin: Denies rashes, bruising Psych: Denies depression, anxiety, hallucinations     Objective:    Blood pressure (!) 130/100, pulse 95, temperature 98.2 F (36.8 C), temperature source Oral, weight 252 lb 3.2 oz (114.4 kg), SpO2 96 %, unknown if currently breastfeeding.  Gen. Pleasant, well-nourished, in no distress, normal affect   HEENT: [place]/AT, face symmetric, conjunctiva clear, no scleral icterus, PERRLA, EOMI, nares patent without drainage, pharynx without erythema or exudate. Neck: No JVD, no thyromegaly, no carotid bruits Lungs: no accessory muscle use, CTAB, no wheezes or rales Cardiovascular: RRR, no m/r/g, no peripheral edema Abdomen: BS present, soft, diffuse ttp of abdomen lower quadrants > upper, no hepatosplenomegaly. Musculoskeletal: No deformities, no cyanosis or clubbing, normal tone Neuro:  A&Ox3, CN II-XII intact, normal gait Skin:  Warm, no lesions/ rash   Wt Readings from Last 3 Encounters:  07/28/21 252 lb 3.2 oz (114.4 kg)  03/25/21 249 lb 9.6 oz (113.2 kg)  12/16/20 249 lb (112.9 kg)    Lab Results  Component Value Date   WBC 7.1 11/30/2020   HGB 13.1 11/30/2020   HCT 40.4 11/30/2020   PLT 334 11/30/2020   GLUCOSE 89 11/30/2020  CHOL 187 08/05/2020   TRIG 70 08/05/2020   HDL 53 08/05/2020   LDLCALC 118 (H) 08/05/2020   ALT 14 11/30/2020   AST 18 11/30/2020   NA 137 11/30/2020   K 3.5 11/30/2020   CL 103 11/30/2020   CREATININE 0.75 11/30/2020   BUN 5 (L) 11/30/2020   CO2 24 11/30/2020   TSH 1.54 08/05/2020   HGBA1C 5.6 08/05/2020    Assessment/Plan:  Lower abdominal pain  -2/2 cramping a/w menses and endometriosis -given Toradol inj in clinic -advised to  contact OB/Gyn for appt. -pt encouraged to make a decision regarding birth control vs pursuing fertility preservation options sooner rather than later. -given precautions  - Plan: ketorolac (TORADOL) injection 60 mg, ketorolac (TORADOL) 10 MG tablet  Endometriosis  -contributing to menorrhagia along with fibroids. -advised to f/u with OB/Gyn - Plan: ketorolac (TORADOL) injection 60 mg, ketorolac (TORADOL) 10 MG tablet  Fibroids  -contributing to menorrhagia -f/u with OB/Gyn - Plan: CBC with Differential/Platelet  Abnormal uterine bleeding (AUB) -discussed concern for anemia.  Will obtain labs -pt advised to contact OB/Gyn in regards to increased symptoms and sooner appt -given precautions.  - Plan: CBC with Differential/Platelet, TSH, T4, Free  Palpitations  -concern for anemia - Plan: CBC with Differential/Platelet, Iron, TIBC and Ferritin Panel, TSH, T4, Free  Essential hypertension  -uncontrolled -advised venlafaxine may be contributing.  Pain also contributing.  Discussed weaning venlafaxine and switching to a different antidepressant. -will start procardia xl given pt's desire to conceive. -advised to obtain a bp cuff for home monitoring. - Plan: CMP, NIFEdipine (PROCARDIA XL) 30 MG 24 hr tablet  Anxiety and depression -elevated -continue venlafaxine for now. -advised to consider weaning venlafaxine and starting a different medication given increasing bp. -counseling/BH encouraged -given precautions  F/u prn in 3-4 wks  Grier Mitts, MD

## 2021-07-28 NOTE — Patient Instructions (Addendum)
We should work to wean down on the Effexor 150 mg daily to see how your blood pressure responds.  I understand at this time it may be difficult to do so with everything going on.  Given elevation in blood pressure we will start Procardia 30 mg 1 tab daily for your blood pressure.  You should obtain a blood pressure monitor and try to keep a record of your blood pressure to bring with you to your next appointment in 3 weeks.

## 2021-07-29 LAB — IRON,TIBC AND FERRITIN PANEL
%SAT: 12 % (calc) — ABNORMAL LOW (ref 16–45)
Ferritin: 8 ng/mL — ABNORMAL LOW (ref 16–154)
Iron: 38 ug/dL — ABNORMAL LOW (ref 40–190)
TIBC: 329 mcg/dL (calc) (ref 250–450)

## 2021-07-29 MED ORDER — KETOROLAC TROMETHAMINE 10 MG PO TABS: 10.0000 mg | ORAL_TABLET | Freq: Four times a day (QID) | ORAL | 0 refills | Status: AC | PRN

## 2021-09-02 ENCOUNTER — Ambulatory Visit: Payer: 59 | Admitting: Obstetrics & Gynecology

## 2021-11-12 ENCOUNTER — Other Ambulatory Visit: Payer: Self-pay | Admitting: Family Medicine

## 2021-11-12 DIAGNOSIS — I10 Essential (primary) hypertension: Secondary | ICD-10-CM

## 2021-11-12 NOTE — Telephone Encounter (Signed)
LVM instructions for pt to call back to schedule f/u with PCP (for HTN).

## 2021-11-19 ENCOUNTER — Encounter: Payer: Self-pay | Admitting: Family Medicine

## 2021-11-19 ENCOUNTER — Other Ambulatory Visit (HOSPITAL_COMMUNITY)
Admission: RE | Admit: 2021-11-19 | Discharge: 2021-11-19 | Disposition: A | Discharge status: Home / Self Care | Payer: Managed Care, Other (non HMO) | Source: Ambulatory Visit | Attending: Family Medicine | Admitting: Family Medicine

## 2021-11-19 ENCOUNTER — Ambulatory Visit (INDEPENDENT_AMBULATORY_CARE_PROVIDER_SITE_OTHER): Payer: Managed Care, Other (non HMO) | Admitting: Family Medicine

## 2021-11-19 VITALS — BP 140/100 | HR 83 | Temp 98.6°F | Ht 66.0 in | Wt 255.4 lb

## 2021-11-19 DIAGNOSIS — Z8619 Personal history of other infectious and parasitic diseases: Secondary | ICD-10-CM | POA: Diagnosis not present

## 2021-11-19 DIAGNOSIS — N809 Endometriosis, unspecified: Secondary | ICD-10-CM | POA: Diagnosis not present

## 2021-11-19 DIAGNOSIS — F411 Generalized anxiety disorder: Secondary | ICD-10-CM | POA: Diagnosis not present

## 2021-11-19 DIAGNOSIS — N921 Excessive and frequent menstruation with irregular cycle: Secondary | ICD-10-CM

## 2021-11-19 DIAGNOSIS — Z124 Encounter for screening for malignant neoplasm of cervix: Secondary | ICD-10-CM | POA: Diagnosis not present

## 2021-11-19 DIAGNOSIS — Z113 Encounter for screening for infections with a predominantly sexual mode of transmission: Secondary | ICD-10-CM | POA: Insufficient documentation

## 2021-11-19 DIAGNOSIS — I10 Essential (primary) hypertension: Secondary | ICD-10-CM

## 2021-11-19 DIAGNOSIS — Z0000 Encounter for general adult medical examination without abnormal findings: Secondary | ICD-10-CM

## 2021-11-19 LAB — BASIC METABOLIC PANEL
BUN: 8 mg/dL (ref 6–23)
CO2: 25 mEq/L (ref 19–32)
Calcium: 9.3 mg/dL (ref 8.4–10.5)
Chloride: 103 mEq/L (ref 96–112)
Creatinine, Ser: 0.73 mg/dL (ref 0.40–1.20)
GFR: 107.35 mL/min (ref >60.00)
Glucose, Bld: 80 mg/dL (ref 70–99)
Potassium: 3.5 mEq/L (ref 3.5–5.1)
Sodium: 135 mEq/L (ref 135–145)

## 2021-11-19 LAB — CBC WITH DIFFERENTIAL/PLATELET
Basophils Absolute: 0 10*3/uL (ref 0.0–0.1)
Basophils Relative: 0.6 % (ref 0.0–3.0)
Eosinophils Absolute: 0.1 10*3/uL (ref 0.0–0.7)
Eosinophils Relative: 1.2 % (ref 0.0–5.0)
HCT: 37.6 % (ref 36.0–46.0)
Hemoglobin: 12 g/dL (ref 12.0–15.0)
Lymphocytes Relative: 25.9 % (ref 12.0–46.0)
Lymphs Abs: 1.8 10*3/uL (ref 0.7–4.0)
MCHC: 32 g/dL (ref 30.0–36.0)
MCV: 73.5 fl — ABNORMAL LOW (ref 78.0–100.0)
Monocytes Absolute: 0.6 10*3/uL (ref 0.1–1.0)
Monocytes Relative: 8.6 % (ref 3.0–12.0)
Neutro Abs: 4.5 10*3/uL (ref 1.4–7.7)
Neutrophils Relative %: 63.7 % (ref 43.0–77.0)
Platelets: 340 10*3/uL (ref 150.0–400.0)
RBC: 5.11 Mil/uL (ref 3.87–5.11)
RDW: 15.3 % (ref 11.5–15.5)
WBC: 7.1 10*3/uL (ref 4.0–10.5)

## 2021-11-19 LAB — LIPID PANEL
Cholesterol: 228 mg/dL — ABNORMAL HIGH (ref 0–200)
HDL: 65.1 mg/dL (ref >39.00)
LDL Cholesterol: 151 mg/dL — ABNORMAL HIGH (ref 0–99)
NonHDL: 162.8
Total CHOL/HDL Ratio: 4
Triglycerides: 58 mg/dL (ref 0.0–149.0)
VLDL: 11.6 mg/dL (ref 0.0–40.0)

## 2021-11-19 LAB — TSH: TSH: 0.97 u[IU]/mL (ref 0.35–5.50)

## 2021-11-19 LAB — HEMOGLOBIN A1C: Hgb A1c MFr Bld: 6 % (ref 4.6–6.5)

## 2021-11-19 LAB — T4, FREE: Free T4: 0.96 ng/dL (ref 0.60–1.60)

## 2021-11-19 MED ORDER — DULOXETINE HCL 20 MG PO CPEP
20.0000 mg | ORAL_CAPSULE | Freq: Every day | ORAL | 2 refills | Status: DC
Start: 2021-11-19 — End: 2022-03-04

## 2021-11-19 NOTE — Progress Notes (Addendum)
Subjective:     Frances Hill is a 36 y.o. female and is here for a comprehensive physical exam. The patient reports continued elevation in BP when taking venlafaxine.  Patient stopped taking venlafaxine daily, taking as needed 1-2 times per week.  Patient states on the days she takes the medication her blood pressure is elevated.  Diastolic typically 100.  Taking nifedipine XL 30 mg daily.  Patient endorses continued irregular heavy menses.  Had 2 cycles a few months ago.  Currently on menses.  Endorses continued pelvic pain 2/2 endometriosis.  May have orange/red discharge after intercourse and pain during.  Patient has not made any decisions regarding starting hormonal contraception versus fertility medications.  Followed by OB/GYN and fertility specialist as patient would like to conceive.  Last Pap 2021 with HPV.. 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[employer], exercise - 3-4 times per week, walks 1-2 miles, weights at [employer], other cardio, diet is good, improved from 2014   Social Determinants of Health   Financial Resource Strain: Not on file  Food Insecurity: Not on file  Transportation Needs: Not on file  Physical Activity: Not on file  Stress: Not on file  Social Connections: Not on file  Intimate Partner Violence: Not on file   Health Maintenance  Topic Date Due   COVID-19  Vaccine (3 - Booster for Moderna series) 03/09/2020   INFLUENZA VACCINE  Never done   PAP SMEAR-Modifier  08/06/2023   TETANUS/TDAP  10/27/2026   Hepatitis C Screening  Completed   HIV Screening  Completed   HPV VACCINES  Aged Out    The following portions of the patient's history were reviewed and updated as appropriate: allergies, current medications, past family history, past medical history, past social history, past surgical history, and problem list.  Review of Systems Pertinent items noted in HPI and remainder of comprehensive ROS otherwise negative.   Objective:    BP (!) 140/100 (BP Location: Left Arm, Patient Position: Sitting, Cuff Size: Large)   Pulse 83   Temp 98.6 F (37 C) (Oral)   Ht 5\' 6"  (1.676 m)   Wt 255 lb 6.4 oz (115.8 kg)   LMP 11/14/2021   SpO2 99%   BMI 41.22 kg/m  General appearance: Pleasant, cooperative, in NAD Head: Normocephalic, without obvious abnormality, atraumatic Eyes: conjunctivae/corneas clear. PERRL, EOM's intact. Fundi benign. Ears: normal TM's and external ear canals both ears Nose: Nares normal. Septum midline. Mucosa normal. No drainage or sinus tenderness. Throat: lips, mucosa, and tongue normal; teeth and gums normal Neck: no adenopathy, no carotid bruit, no JVD, supple, symmetrical, trachea midline, and thyroid not enlarged, symmetric, no tenderness/mass/nodules Lungs: clear to auscultation bilaterally Heart: regular rate and rhythm, S1, S2 normal, no murmur, click, rub or gallop Abdomen:  Bowel sounds present, soft, mildly diffuse TTP, ND, no hepatosplenomegaly Pelvic: cervix normal in appearance, external genitalia normal, no adnexal masses or tenderness, no cervical motion tenderness, rectovaginal septum normal, uterus normal size, shape, and consistency, and blood present at introitus, in vaginal vault , and at cervical os.  Pap collected. Extremities: extremities normal, atraumatic, no cyanosis or edema Pulses: 2+ and  symmetric Skin: Skin color, texture, turgor normal. No rashes or lesions Lymph nodes: Cervical, supraclavicular, and axillary nodes normal. Neurologic: Alert and oriented X 3, normal strength and tone. Normal symmetric reflexes. Normal coordination and gait    Assessment:    Healthy female exam with cramping 2/2 current menses, endometriosis, HTN 2/2 medication.   Plan:    Anticipatory guidance given including wearing seatbelts, smoke detectors in the home, increasing physical activity, increasing p.o. intake of water and vegetables.  -Obtain labs -Pap obtained this visit -Colonoscopy and mammogram not indicated 2/2 age -Immunizations reviewed -Given handout -Neck CPE in 1 year See After Visit Summary for Counseling Recommendations   Essential hypertension -HTN 2/2 use of venlafaxine -Discussed d/c venlafaxine use as patient started taking as needed. -Continue Procardia XL 30 mg -Patient encouraged to continue checking BP at home and keeping a log to bring with her to clinic -Given precautions  - Plan: CBC with Differential/Platelet, Basic metabolic panel, TSH, T4, Free, Lipid panel  Screening for cervical cancer  - Plan: PAP [[place]]  History of HPV infection -Pap from 08/05/2020 with high risk HPV. - Plan: PAP [[place]]  Menorrhagia with irregular cycle -Chronic condition -Patient still deciding on medication options such as birth control versus fertility medication as ability to conceive is important to her. -Continue follow-up with OB/GYN and fertility specialist - Plan: CBC with Differential/Platelet, TSH, T4, Free, Iron, TIBC and Ferritin Panel  Routine screening for STI (sexually transmitted infection)  - Plan: HIV Antibody (routine testing w rflx), RPR  GAD (generalized anxiety disorder)  -Improving -As patient started taking venlafaxine as needed discussed discontinuing use.  Wean not indicated as patient taking 1-2 times per week. -Discussed starting  Cymbalta daily as may also help with chronic pelvic pain 2/2 endometriosis. - Plan: DULoxetine (CYMBALTA) 20 MG capsule  Endometriosis  -Patient still deciding on medication options such as birth control versus fertility medication as ability to conceive is important to her. -Continue follow-up with OB/GYN and fertility specialist - Plan: DULoxetine (CYMBALTA) 20 MG capsule  Follow-up in 1 month, sooner if needed  Abbe Amsterdam, MD

## 2021-11-22 LAB — IRON,TIBC AND FERRITIN PANEL
%SAT: 10 % (calc) — ABNORMAL LOW (ref 16–45)
Ferritin: 10 ng/mL — ABNORMAL LOW (ref 16–154)
Iron: 40 ug/dL (ref 40–190)
TIBC: 408 mcg/dL (calc) (ref 250–450)

## 2021-11-22 LAB — HIV ANTIBODY (ROUTINE TESTING W REFLEX): HIV 1&2 Ab, 4th Generation: NONREACTIVE

## 2021-11-22 LAB — RPR: RPR Ser Ql: NONREACTIVE

## 2021-11-24 LAB — CYTOLOGY - PAP
Chlamydia: NEGATIVE
Comment: NEGATIVE
Comment: NEGATIVE
Comment: NEGATIVE
Comment: NORMAL
Diagnosis: NEGATIVE
High risk HPV: NEGATIVE
Neisseria Gonorrhea: NEGATIVE
Trichomonas: NEGATIVE

## 2021-12-10 ENCOUNTER — Emergency Department
Admission: EM | Admit: 2021-12-10 | Discharge: 2021-12-10 | Disposition: A | Discharge status: Home / Self Care | Payer: Commercial Managed Care - POS | Attending: Emergency Medicine | Admitting: Emergency Medicine

## 2021-12-10 DIAGNOSIS — L03115 Cellulitis of right lower limb: Secondary | ICD-10-CM | POA: Insufficient documentation

## 2021-12-10 HISTORY — DX: Endometriosis, unspecified: N80.9

## 2021-12-10 HISTORY — DX: Anxiety disorder, unspecified: F41.9

## 2021-12-10 HISTORY — DX: Depression, unspecified: F32.A

## 2021-12-10 LAB — COMPREHENSIVE METABOLIC PANEL
ALT: 16 U/L (ref 0–55)
AST (SGOT): 20 U/L (ref 5–41)
Albumin/Globulin Ratio: 1 (ref 0.9–2.2)
Albumin: 3.5 g/dL (ref 3.5–5.0)
Alkaline Phosphatase: 70 U/L (ref 37–117)
Anion Gap: 8 (ref 5.0–15.0)
BUN: 10 mg/dL (ref 7.0–21.0)
Bilirubin, Total: 0.5 mg/dL (ref 0.2–1.2)
CO2: 23 mEq/L (ref 17–29)
Calcium: 9 mg/dL (ref 8.5–10.5)
Chloride: 105 mEq/L (ref 99–111)
Creatinine: 0.8 mg/dL (ref 0.4–1.0)
Globulin: 3.6 g/dL (ref 2.0–3.6)
Glucose: 101 mg/dL — ABNORMAL HIGH (ref 70–100)
Potassium: 3.9 mEq/L (ref 3.5–5.3)
Protein, Total: 7.1 g/dL (ref 6.0–8.3)
Sodium: 136 mEq/L (ref 135–145)

## 2021-12-10 LAB — CBC AND DIFFERENTIAL
Absolute NRBC: 0 10*3/uL (ref 0.00–0.00)
Basophils Absolute Automated: 0.03 10*3/uL (ref 0.00–0.08)
Basophils Automated: 0.2 %
Eosinophils Absolute Automated: 0.04 10*3/uL (ref 0.00–0.44)
Eosinophils Automated: 0.3 %
Hematocrit: 38.1 % (ref 34.7–43.7)
Hgb: 12.3 g/dL (ref 11.4–14.8)
Immature Granulocytes Absolute: 0.06 10*3/uL (ref 0.00–0.07)
Immature Granulocytes: 0.4 %
Instrument Absolute Neutrophil Count: 12 10*3/uL — ABNORMAL HIGH (ref 1.10–6.33)
Lymphocytes Absolute Automated: 0.95 10*3/uL (ref 0.42–3.22)
Lymphocytes Automated: 6.6 %
MCH: 23.9 pg — ABNORMAL LOW (ref 25.1–33.5)
MCHC: 32.3 g/dL (ref 31.5–35.8)
MCV: 74 fL — ABNORMAL LOW (ref 78.0–96.0)
MPV: 10.1 fL (ref 8.9–12.5)
Monocytes Absolute Automated: 1.37 10*3/uL — ABNORMAL HIGH (ref 0.21–0.85)
Monocytes: 9.5 %
Neutrophils Absolute: 12 10*3/uL — ABNORMAL HIGH (ref 1.10–6.33)
Neutrophils: 83 %
Nucleated RBC: 0 /100 WBC (ref 0.0–0.0)
Platelets: 300 10*3/uL (ref 142–346)
RBC: 5.15 10*6/uL — ABNORMAL HIGH (ref 3.90–5.10)
RDW: 15 % (ref 11–15)
WBC: 14.45 10*3/uL — ABNORMAL HIGH (ref 3.10–9.50)

## 2021-12-10 LAB — GFR: EGFR: >60

## 2021-12-10 LAB — HCG, SERUM, QUALITATIVE: Hcg Qualitative: NEGATIVE

## 2021-12-10 LAB — LACTIC ACID, PLASMA: Lactic Acid: 0.6 mmol/L (ref 0.2–2.0)

## 2021-12-10 MED ORDER — OXYCODONE HCL 5 MG PO TABS
5.0000 mg | ORAL_TABLET | Freq: Four times a day (QID) | ORAL | 0 refills | Status: DC | PRN
Start: 2021-12-10 — End: 2021-12-10

## 2021-12-10 MED ORDER — OXYCODONE HCL 5 MG PO TABS
5.0000 mg | ORAL_TABLET | Freq: Four times a day (QID) | ORAL | 0 refills | Status: DC | PRN
Start: 2021-12-10 — End: 2022-12-01

## 2021-12-10 MED ORDER — KETOROLAC TROMETHAMINE 30 MG/ML IJ SOLN
30.0000 mg | Freq: Once | INTRAMUSCULAR | Status: AC
Start: 2021-12-10 — End: 2021-12-10
  Administered 2021-12-10: 30 mg via INTRAVENOUS
  Filled 2021-12-10: qty 1

## 2021-12-10 NOTE — Discharge Instructions (Signed)
You were seen in the emergency today for evaluation of cellulitis on your right medial thigh.  As we discussed, you have not developed an abscess, this was confirmed by ultrasound.  Please continue your prescribed clindamycin and return if symptoms worsen, especially if you have continuation of fluid at the area of infection.  For pain, take ibuprofen, Tylenol and the prescribed oxycodone for severe pain as needed.

## 2021-12-10 NOTE — ED Provider Notes (Signed)
EMERGENCY DEPARTMENT HISTORY AND PHYSICAL EXAM      Patient Information     Patient Name: Linda [name], Linda [name]  Encounter Date:  12/10/2021  Patient DOB:  December 09, 1985  MRN:  [id number]  Room:  05/A05  Rendering Provider: Algernon Huxley, PA-C    History of Presenting Illness     Chief Complaint: possible abscess      HPI Comments:   36 y.o. female with PMHx of HTN, anxiety presenting for evaluation of possible abscess to her right thigh. She was seen at urgent care two days ago, started on clindamycin for cellultis. She has taken four doses, but area of pain is increasing. Denies drainage. Experienced chills, subjective fever last night. Has taken ibuprofen for pain without relief.       PMD: Pcp, None, MD    Past Medical History     Past Medical History:   Diagnosis Date    Anxiety     Depression     Endometriosis        Past Surgical History     Past Surgical History:   Procedure Laterality Date    LAPAROTOMY, SALPINGO-OOPHERECTOMY Left        Family History     History reviewed. No pertinent family history.    Social History     Social History     Socioeconomic History    Marital status: Single     Spouse name: Not on file    Number of children: Not on file    Years of education: Not on file    Highest education level: Not on file   Occupational History    Not on file   Tobacco Use    Smoking status: Never     Passive exposure: Never    Smokeless tobacco: Never   Vaping Use    Vaping Use: Never used   Substance and Sexual Activity    Alcohol use: Yes    Drug use: Never    Sexual activity: Not on file   Other Topics Concern    Not on file   Social History Narrative    Not on file     Social Determinants of Health     Financial Resource Strain: Not on file   Food Insecurity: Not on file   Transportation Needs: Not on file   Physical Activity: Not on file   Stress: Not on file   Social Connections: Not on file   Intimate Partner Violence: Not on file   Housing Stability: Not on file       Allergies     No Known  Allergies    Home Medications     Prior to Admission medications    Medication Sig Start Date End Date Taking? Authorizing Provider   clindamycin (CLEOCIN) 300 MG capsule  12/09/21  Yes [provider]   DULoxetine (CYMBALTA) 20 MG capsule  11/19/21  Yes [provider]   NIFEdipine XL (PROCARDIA XL) 30 MG 24 hr tablet Take 30 mg by mouth daily 11/12/21  Yes [provider]   oxyCODONE (ROXICODONE) 5 MG immediate release tablet Take 1 tablet (5 mg) by mouth every 6 (six) hours as needed for Pain 12/10/21   Dorthea Maina B, PA         Review of Systems     Review of Systems   Constitutional:  Positive for chills and fever.   HENT:  Negative for congestion and sore throat.    Respiratory:  Negative for cough  and shortness of breath.    Cardiovascular:  Negative for chest pain and palpitations.   Gastrointestinal:  Negative for abdominal pain, nausea and vomiting.   Genitourinary:  Negative for dysuria, frequency and urgency.   Musculoskeletal:  Negative for back pain, joint pain and neck pain.   Skin:  Positive for rash.   Neurological:  Negative for dizziness, tingling, sensory change, focal weakness and headaches.       Physical Exam     No data found.    Physical Exam  Vitals and nursing note reviewed.   Constitutional:       General: She is not in acute distress.     Appearance: Normal appearance. She is obese. She is not toxic-appearing.   HENT:      Head: Normocephalic and atraumatic.   Eyes:      Extraocular Movements: Extraocular movements intact.      Conjunctiva/sclera: Conjunctivae normal.      Pupils: Pupils are equal, round, and reactive to light.   Cardiovascular:      Rate and Rhythm: Normal rate and regular rhythm.   Pulmonary:      Effort: Pulmonary effort is normal.      Breath sounds: Normal breath sounds.   Abdominal:      General: There is no distension.      Palpations: Abdomen is soft.      Tenderness: There is no abdominal tenderness. There is no guarding.    Musculoskeletal:         General: Normal range of motion.      Cervical back: Normal range of motion and neck supple.   Skin:     General: Skin is warm and dry.      Capillary Refill: Capillary refill takes less than 2 seconds.      Comments: There is an ~6cm area of induration and tenderness right medial thigh. No drainage or bleeding. Consistent with cellulitis/early abscess   Neurological:      General: No focal deficit present.      Mental Status: She is alert and oriented to person, place, and time.      Cranial Nerves: No cranial nerve deficit.      Motor: No weakness.   Psychiatric:         Mood and Affect: Mood normal.         Behavior: Behavior normal.         Orders Placed During This Encounter     Orders Placed This Encounter   Procedures    CBC and differential    Lactic Acid    Comprehensive metabolic panel    Beta HCG, Qual, Serum    GFR       ED Medications Administered     ED Medication Orders (From admission, onward)      Start Ordered     Status Ordering Provider    12/10/21 [phone] 12/10/21 0806  ketorolac (TORADOL) injection 30 mg  Once        Route: Intravenous  Ordered Dose: 30 mg     Last MAR action: Given [place] Schnitker B            Diagnostic Study Results and Data Review     The results of the diagnostic studies below were reviewed by the ED provider:    Labs  Results       Procedure Component Value Units Date/Time    Comprehensive metabolic panel [[id number]]  (Abnormal) Collected: 12/10/21 9629  Specimen: Blood Updated: 12/10/21 0848     Glucose 101 mg/dL      BUN 16.1 mg/dL      Creatinine 0.8 mg/dL      Sodium 096 mEq/L      Potassium 3.9 mEq/L      Chloride 105 mEq/L      CO2 23 mEq/L      Calcium 9.0 mg/dL      Protein, Total 7.1 g/dL      Albumin 3.5 g/dL      AST (SGOT) 20 U/L      ALT 16 U/L      Alkaline Phosphatase 70 U/L      Bilirubin, Total 0.5 mg/dL      Globulin 3.6 g/dL      Albumin/Globulin Ratio 1.0     Anion Gap 8.0    GFR [[id number]] Collected: 12/10/21 0821      Updated: 12/10/21 0848     EGFR >60.0    Beta HCG, Qual, Serum [[id number]] Collected: 12/10/21 0821    Specimen: Blood Updated: 12/10/21 0841     Hcg Qualitative Negative    Lactic Acid [[id number]] Collected: 12/10/21 0821    Specimen: Blood Updated: 12/10/21 0836     Lactic Acid 0.6 mmol/L     CBC and differential [[id number]]  (Abnormal) Collected: 12/10/21 0821    Specimen: Blood Updated: 12/10/21 0829     WBC 14.45 x10 3/uL      Hgb 12.3 g/dL      Hematocrit 41.3 %      Platelets 300 x10 3/uL      RBC 5.15 x10 6/uL      MCV 74.0 fL      MCH 23.9 pg      MCHC 32.3 g/dL      RDW 15 %      MPV 10.1 fL      Instrument Absolute Neutrophil Count 12.00 x10 3/uL      Neutrophils 83.0 %      Lymphocytes Automated 6.6 %      Monocytes 9.5 %      Eosinophils Automated 0.3 %      Basophils Automated 0.2 %      Immature Granulocytes 0.4 %      Nucleated RBC 0.0 /100 WBC      Neutrophils Absolute 12.00 x10 3/uL      Lymphocytes Absolute Automated 0.95 x10 3/uL      Monocytes Absolute Automated 1.37 x10 3/uL      Eosinophils Absolute Automated 0.04 x10 3/uL      Basophils Absolute Automated 0.03 x10 3/uL      Immature Granulocytes Absolute 0.06 x10 3/uL      Absolute NRBC 0.00 x10 3/uL             Radiologic Studies  Radiology Results (24 Hour)       ** No results found for the last 24 hours. **              Abnormal results/incidental findings discussed with pt and/or family: yes      MDM and Clinical Notes     Working Differential (not completely inclusive): cellulitis, abscess    Nursing records reviewed and agree: Yes    Clinical Notes: 36 yo F presenting for evaluation of possible abscess to right medial thigh x 3-4 days. Seen at [facility] yesterday, prescribed clindamycin, no I&D performed. Here today with worsening pain and area of pain has increased in size. Also had chills,  subjective fever last night.   -She has an ~6cm area of induration right medial thigh with no fluctuance, bleeding or drainage. Bedside ultrasound  confirmed no fluid pocket. Consistent with early abscess/cellulitis. Discussed with Dr. Malen Gauze who recommended labs to evaluate white count. If white count very high, may consider admission for IV abx d/t failure of clinda. If normal white count, recommend d/c home with continuation of clinda and pain meds.     -WBC Count 14K - Discussed with Dr. Malen Gauze. This is not significantly elevated and recommend d/c with continuation of clinda. Discussed return precautions with patient and oxycodone prescribed for severe breakthrough pain.        Prescriptions       Discharge Medication List as of 12/10/2021  9:29 AM        START taking these medications    Details   oxyCODONE (ROXICODONE) 5 MG immediate release tablet Take 1 tablet (5 mg) by mouth every 6 (six) hours as needed for Pain, Starting Fri 12/10/2021, [facility]             Diagnosis and Disposition     Clinical Impression:  1. Cellulitis of right thigh      Final diagnoses:   Cellulitis of right thigh       Disposition:  ED Disposition       ED Disposition   Discharge    Condition   --    Date/Time   Fri Dec 10, 2021  9:29 AM    Comment   [place] discharge to home/self care.    Condition at disposition: Stable                       Rendering Provider: Algernon Huxley, PA-C    Attending's signature signifies review of the provider note and clinical impression.      This note was generated by the [facility] EMR system/Dragon speech recognition and may contain inherent errors or omissions not intended by the user. Grammatical errors, random word insertions, deletions, pronoun errors and incomplete sentences are occasional consequences of this technology due to software limitations. Not all errors are caught or corrected. If there are questions or concerns about the content of this note or information contained within the body of this dictation they should be addressed directly with the author for clarification.           Josemaria Brining B, PA  12/11/21 1004

## 2021-12-10 NOTE — ED Triage Notes (Signed)
Pt presents to the ED with abcess to the L upper thigh for the past 3 days. Pt went to urgent care yesterday and was prescribed clindamycin which she has been take. States the pain is worsening and she has noticed chills.

## 2021-12-11 ENCOUNTER — Inpatient Hospital Stay
Admission: EM | Admit: 2021-12-11 | Discharge: 2021-12-15 | DRG: 872 | Disposition: A | Discharge status: Home / Self Care | Payer: Commercial Managed Care - POS | Attending: Family Medicine | Admitting: Family Medicine

## 2021-12-11 ENCOUNTER — Emergency Department: Payer: Commercial Managed Care - POS

## 2021-12-11 DIAGNOSIS — D72829 Elevated white blood cell count, unspecified: Secondary | ICD-10-CM

## 2021-12-11 DIAGNOSIS — Z20822 Contact with and (suspected) exposure to covid-19: Secondary | ICD-10-CM | POA: Diagnosis present

## 2021-12-11 DIAGNOSIS — D259 Leiomyoma of uterus, unspecified: Secondary | ICD-10-CM | POA: Diagnosis present

## 2021-12-11 DIAGNOSIS — F419 Anxiety disorder, unspecified: Secondary | ICD-10-CM | POA: Diagnosis present

## 2021-12-11 DIAGNOSIS — D509 Iron deficiency anemia, unspecified: Secondary | ICD-10-CM | POA: Diagnosis present

## 2021-12-11 DIAGNOSIS — I10 Essential (primary) hypertension: Secondary | ICD-10-CM | POA: Diagnosis present

## 2021-12-11 DIAGNOSIS — Z9079 Acquired absence of other genital organ(s): Secondary | ICD-10-CM

## 2021-12-11 DIAGNOSIS — R1900 Intra-abdominal and pelvic swelling, mass and lump, unspecified site: Secondary | ICD-10-CM

## 2021-12-11 DIAGNOSIS — L02415 Cutaneous abscess of right lower limb: Secondary | ICD-10-CM | POA: Diagnosis present

## 2021-12-11 DIAGNOSIS — L0390 Cellulitis, unspecified: Principal | ICD-10-CM

## 2021-12-11 DIAGNOSIS — A4189 Other specified sepsis: Principal | ICD-10-CM | POA: Diagnosis present

## 2021-12-11 DIAGNOSIS — Z6841 Body Mass Index (BMI) 40.0 and over, adult: Secondary | ICD-10-CM

## 2021-12-11 DIAGNOSIS — M79605 Pain in left leg: Secondary | ICD-10-CM

## 2021-12-11 DIAGNOSIS — L02214 Cutaneous abscess of groin: Secondary | ICD-10-CM | POA: Diagnosis present

## 2021-12-11 DIAGNOSIS — Z79899 Other long term (current) drug therapy: Secondary | ICD-10-CM

## 2021-12-11 DIAGNOSIS — K5900 Constipation, unspecified: Secondary | ICD-10-CM | POA: Diagnosis present

## 2021-12-11 DIAGNOSIS — Z90721 Acquired absence of ovaries, unilateral: Secondary | ICD-10-CM

## 2021-12-11 DIAGNOSIS — D638 Anemia in other chronic diseases classified elsewhere: Secondary | ICD-10-CM | POA: Diagnosis present

## 2021-12-11 DIAGNOSIS — L03115 Cellulitis of right lower limb: Secondary | ICD-10-CM | POA: Diagnosis present

## 2021-12-11 LAB — COMPREHENSIVE METABOLIC PANEL
ALT: 22 U/L (ref 0–55)
AST (SGOT): 23 U/L (ref 5–41)
Albumin/Globulin Ratio: 0.9 (ref 0.9–2.2)
Albumin: 3.3 g/dL — ABNORMAL LOW (ref 3.5–5.0)
Alkaline Phosphatase: 69 U/L (ref 37–117)
Anion Gap: 8 (ref 5.0–15.0)
BUN: 8 mg/dL (ref 7.0–21.0)
Bilirubin, Total: 0.7 mg/dL (ref 0.2–1.2)
CO2: 22 mEq/L (ref 17–29)
Calcium: 8.6 mg/dL (ref 8.5–10.5)
Chloride: 106 mEq/L (ref 99–111)
Creatinine: 0.8 mg/dL (ref 0.4–1.0)
Globulin: 3.5 g/dL (ref 2.0–3.6)
Glucose: 106 mg/dL — ABNORMAL HIGH (ref 70–100)
Potassium: 3.6 mEq/L (ref 3.5–5.3)
Protein, Total: 6.8 g/dL (ref 6.0–8.3)
Sodium: 136 mEq/L (ref 135–145)

## 2021-12-11 LAB — CBC AND DIFFERENTIAL
Absolute NRBC: 0 10*3/uL (ref 0.00–0.00)
Basophils Absolute Automated: 0.03 10*3/uL (ref 0.00–0.08)
Basophils Automated: 0.2 %
Eosinophils Absolute Automated: 0.06 10*3/uL (ref 0.00–0.44)
Eosinophils Automated: 0.3 %
Hematocrit: 36.1 % (ref 34.7–43.7)
Hgb: 11.8 g/dL (ref 11.4–14.8)
Immature Granulocytes Absolute: 0.1 10*3/uL — ABNORMAL HIGH (ref 0.00–0.07)
Immature Granulocytes: 0.6 %
Instrument Absolute Neutrophil Count: 14.12 10*3/uL — ABNORMAL HIGH (ref 1.10–6.33)
Lymphocytes Absolute Automated: 1.34 10*3/uL (ref 0.42–3.22)
Lymphocytes Automated: 7.8 %
MCH: 23.9 pg — ABNORMAL LOW (ref 25.1–33.5)
MCHC: 32.7 g/dL (ref 31.5–35.8)
MCV: 73.2 fL — ABNORMAL LOW (ref 78.0–96.0)
MPV: 10 fL (ref 8.9–12.5)
Monocytes Absolute Automated: 1.62 10*3/uL — ABNORMAL HIGH (ref 0.21–0.85)
Monocytes: 9.4 %
Neutrophils Absolute: 14.12 10*3/uL — ABNORMAL HIGH (ref 1.10–6.33)
Neutrophils: 81.7 %
Nucleated RBC: 0 /100 WBC (ref 0.0–0.0)
Platelets: 283 10*3/uL (ref 142–346)
RBC: 4.93 10*6/uL (ref 3.90–5.10)
RDW: 15 % (ref 11–15)
WBC: 17.27 10*3/uL — ABNORMAL HIGH (ref 3.10–9.50)

## 2021-12-11 LAB — LACTIC ACID, PLASMA: Lactic Acid: 0.8 mmol/L (ref 0.2–2.0)

## 2021-12-11 LAB — COVID-19 (SARS-COV-2): SARS CoV 2 Overall Result: NOT DETECTED

## 2021-12-11 LAB — GFR: EGFR: >60

## 2021-12-11 LAB — SEDIMENTATION RATE: Sed Rate: 36 mm/Hr — ABNORMAL HIGH (ref 0–20)

## 2021-12-11 LAB — HCG, SERUM, QUALITATIVE: Hcg Qualitative: NEGATIVE

## 2021-12-11 LAB — C-REACTIVE PROTEIN: C-Reactive Protein: 16.7 mg/dL — ABNORMAL HIGH (ref 0.0–1.1)

## 2021-12-11 MED ORDER — NIFEDIPINE ER OSMOTIC RELEASE 30 MG PO TB24
30.0000 mg | ORAL_TABLET | Freq: Every day | ORAL | Status: DC
Start: 2021-12-11 — End: 2021-12-15
  Administered 2021-12-11 – 2021-12-15 (×5): 30 mg via ORAL
  Filled 2021-12-11 (×5): qty 1

## 2021-12-11 MED ORDER — ONDANSETRON HCL 4 MG/2ML IJ SOLN
4.0000 mg | Freq: Once | INTRAMUSCULAR | Status: AC
Start: 2021-12-11 — End: 2021-12-11
  Administered 2021-12-11: 4 mg via INTRAVENOUS
  Filled 2021-12-11: qty 2

## 2021-12-11 MED ORDER — SODIUM CHLORIDE 0.9 % IV MBP
1.5000 g | Freq: Four times a day (QID) | INTRAVENOUS | Status: DC
Start: 2021-12-11 — End: 2021-12-14
  Administered 2021-12-11 – 2021-12-14 (×13): 1.5 g via INTRAVENOUS
  Filled 2021-12-11 (×18): qty 4

## 2021-12-11 MED ORDER — KETOROLAC TROMETHAMINE 30 MG/ML IJ SOLN
15.0000 mg | Freq: Once | INTRAMUSCULAR | Status: AC
Start: 2021-12-11 — End: 2021-12-11
  Administered 2021-12-11: 15 mg via INTRAVENOUS
  Filled 2021-12-11: qty 1

## 2021-12-11 MED ORDER — GLUCOSE 40 % PO GEL (WRAP)
15.0000 g | ORAL | Status: DC | PRN
Start: 2021-12-11 — End: 2021-12-15

## 2021-12-11 MED ORDER — IBUPROFEN 400 MG PO TABS
400.0000 mg | ORAL_TABLET | Freq: Four times a day (QID) | ORAL | Status: DC | PRN
Start: 2021-12-11 — End: 2021-12-15
  Administered 2021-12-11 – 2021-12-15 (×4): 400 mg via ORAL
  Filled 2021-12-11 (×4): qty 1

## 2021-12-11 MED ORDER — MORPHINE SULFATE 2 MG/ML IJ/IV SOLN (WRAP)
2.0000 mg | Status: DC | PRN
Start: 2021-12-11 — End: 2021-12-11
  Administered 2021-12-11: 2 mg via INTRAVENOUS
  Filled 2021-12-11: qty 1

## 2021-12-11 MED ORDER — ACETAMINOPHEN 325 MG PO TABS
650.0000 mg | ORAL_TABLET | Freq: Four times a day (QID) | ORAL | Status: DC | PRN
Start: 2021-12-11 — End: 2021-12-15
  Administered 2021-12-11: 650 mg via ORAL
  Filled 2021-12-11: qty 2

## 2021-12-11 MED ORDER — SODIUM CHLORIDE 0.9 % IV BOLUS
1000.0000 mL | Freq: Once | INTRAVENOUS | Status: AC
Start: 2021-12-11 — End: 2021-12-11
  Administered 2021-12-11: 1000 mL via INTRAVENOUS

## 2021-12-11 MED ORDER — DEXTROSE 50 % IV SOLN
12.5000 g | INTRAVENOUS | Status: DC | PRN
Start: 2021-12-11 — End: 2021-12-15

## 2021-12-11 MED ORDER — OXYCODONE-ACETAMINOPHEN 5-325 MG PO TABS
1.0000 | ORAL_TABLET | Freq: Four times a day (QID) | ORAL | Status: DC | PRN
Start: 2021-12-11 — End: 2021-12-15
  Administered 2021-12-11 – 2021-12-15 (×10): 1 via ORAL
  Filled 2021-12-11 (×11): qty 1

## 2021-12-11 MED ORDER — ONDANSETRON HCL 4 MG/2ML IJ SOLN
4.0000 mg | Freq: Four times a day (QID) | INTRAMUSCULAR | Status: DC | PRN
Start: 2021-12-11 — End: 2021-12-15
  Administered 2021-12-14: 4 mg via INTRAVENOUS
  Filled 2021-12-11: qty 2

## 2021-12-11 MED ORDER — MORPHINE SULFATE 4 MG/ML IJ/IV SOLN (WRAP)
4.0000 mg | Freq: Once | Status: AC
Start: 2021-12-11 — End: 2021-12-11
  Administered 2021-12-11: 4 mg via INTRAVENOUS
  Filled 2021-12-11: qty 1

## 2021-12-11 MED ORDER — IODIXANOL 320 MG/ML IV SOLN
100.0000 mL | Freq: Once | INTRAVENOUS | Status: AC | PRN
Start: 2021-12-11 — End: 2021-12-11
  Administered 2021-12-11: 100 mL via INTRAVENOUS

## 2021-12-11 MED ORDER — GLUCAGON 1 MG IJ SOLR (WRAP)
1.0000 mg | INTRAMUSCULAR | Status: DC | PRN
Start: 2021-12-11 — End: 2021-12-15

## 2021-12-11 MED ORDER — ENOXAPARIN SODIUM 40 MG/0.4ML IJ SOSY
40.0000 mg | PREFILLED_SYRINGE | Freq: Every day | INTRAMUSCULAR | Status: DC
Start: 2021-12-11 — End: 2021-12-15
  Administered 2021-12-11 – 2021-12-15 (×5): 40 mg via SUBCUTANEOUS
  Filled 2021-12-11 (×5): qty 0.4

## 2021-12-11 MED ORDER — MORPHINE SULFATE 2 MG/ML IJ/IV SOLN (WRAP)
1.0000 mg | Status: DC | PRN
Start: 2021-12-11 — End: 2021-12-14
  Administered 2021-12-11 – 2021-12-14 (×11): 1 mg via INTRAVENOUS
  Filled 2021-12-11 (×11): qty 1

## 2021-12-11 MED ORDER — VANCOMYCIN HCL IN NACL 1.5-0.9 GM/500ML-% IV SOLN
1500.0000 mg | Freq: Once | INTRAVENOUS | Status: AC
Start: 2021-12-11 — End: 2021-12-11
  Administered 2021-12-11: 1500 mg via INTRAVENOUS
  Filled 2021-12-11: qty 500

## 2021-12-11 MED ORDER — DEXTROSE 10 % IV BOLUS
12.5000 g | INTRAVENOUS | Status: DC | PRN
Start: 2021-12-11 — End: 2021-12-15

## 2021-12-11 MED ORDER — ONDANSETRON 4 MG PO TBDP
4.0000 mg | ORAL_TABLET | Freq: Four times a day (QID) | ORAL | Status: DC | PRN
Start: 2021-12-11 — End: 2021-12-15

## 2021-12-11 MED ORDER — NALOXONE HCL 0.4 MG/ML IJ SOLN (WRAP)
0.2000 mg | INTRAMUSCULAR | Status: DC | PRN
Start: 2021-12-11 — End: 2021-12-15

## 2021-12-11 MED ORDER — DULOXETINE HCL 20 MG PO CPEP
20.0000 mg | ORAL_CAPSULE | Freq: Every day | ORAL | Status: DC
Start: 2021-12-11 — End: 2021-12-15
  Administered 2021-12-11 – 2021-12-15 (×5): 20 mg via ORAL
  Filled 2021-12-11 (×5): qty 1

## 2021-12-11 NOTE — H&P (Signed)
[facility]      Patient: Linda [name]  Date: 12/11/2021   DOB: [birth year]  Admission Date: 12/11/2021   MRN: [id number]  Attending: Manning Charity, MD       Chief Complaint   Patient presents with    Rash      History Gathered From: Self    HISTORY AND PHYSICAL     Terence Ashland Osmer is a 36 y.o. female with a PMHx of Anxiety and HTN who presented with complaining of right inner upper thigh swelling and redness that started on Wednesday the 8th of March 2023. She was seen at urgent care on the same days and subsequently sent home on clindamycin. She was seen in [facility] ER yesterday for continued symptoms. Of note she had bedside US in the ER yesterday (11/12/2021) that noted a pocket of fluid consistent with early abscess/cellulitis. She was then discharged from the ER after advised to continue Clindamycin. She returned today with worsening symptoms. She endorses chills ann feeling feverish although she never taken her temp at home. She is afebrile in the ER.     SIRS criteria was positive in the ER for Elevated RR and WBC. Lactic was negative and blood culture was send, a dose actinomycin was given in the ER.          Past Medical History:   Diagnosis Date    Anxiety     Depression     Endometriosis        Past Surgical History:   Procedure Laterality Date    LAPAROTOMY, SALPINGO-OOPHERECTOMY Left        Prior to Admission medications    Medication Sig Start Date End Date Taking? Authorizing Provider   clindamycin (CLEOCIN) 300 MG capsule  12/09/21   [provider]   DULoxetine (CYMBALTA) 20 MG capsule  11/19/21   [provider]   NIFEdipine XL (PROCARDIA XL) 30 MG 24 hr tablet Take 30 mg by mouth daily 11/12/21   [provider]   oxyCODONE (ROXICODONE) 5 MG immediate release tablet Take 1 tablet (5 mg) by mouth every 6 (six) hours as needed for Pain 12/10/21   Conne[place], Margeaux B, PA       No Known Allergies        No family history on file.    Social History      Tobacco Use    Smoking status: Never     Passive exposure: Never    Smokeless tobacco: Never   Vaping Use    Vaping Use: Never used   Substance Use Topics    Alcohol use: Yes    Drug use: Never       REVIEW OF SYSTEMS     Ten point review of systems negative or as per HPI and below endorsements.    PHYSICAL EXAM     Vital Signs (most recent): BP 141/78   Pulse 91   Temp 98.3 F (36.8 C)   Resp 20   Wt 115 kg (253 lb 8.5 oz)   LMP 11/12/2021   SpO2 99%   Constitutional: No apparent distress and Appears stated age. Patient speaks freely in full sentences.   HEENT: NC/AT, no scleral icterus or conjunctival pallor, no nasal discharge, MMM  Neck: trachea midline, no masses  Cardiovascular: RRR, normal S1 S2, no murmurs  Respiratory: Normal rate. No retractions or increased work of breathing. Clear to auscultation bilaterally  Gastrointestinal: +BS, non-distended, soft, non-tender, no rebound or  guarding, no hepatosplenomegaly  Genitourinary: no suprapubic or costovertebral angle tenderness  Musculoskeletal: Right medial thigh swelling, otherwise No clubbing, or cyanosis. Capillary refill normal  Skin: Right medial thigh circular, swelling and discoloration. Slightly indurated and tender at the center of the swelling. No open skin  Neurologic: EOMI, CN 2-12 grossly intact. no gross motor or sensory deficits,   Psychiatric: affect and mood appropriate. The patient is alert and interactive    LABS & IMAGING     Recent Results (from the past 24 hour(s))   CBC and differential    Collection Time: 12/11/21  5:41 AM   Result Value Ref Range    WBC 17.27 (H) 3.10 - 9.50 x10 3/uL    Hgb 11.8 11.4 - 14.8 g/dL    Hematocrit 16.1 09.6 - 43.7 %    Platelets 283 142 - 346 x10 3/uL    RBC 4.93 3.90 - 5.10 x10 6/uL    MCV 73.2 (L) 78.0 - 96.0 fL    MCH 23.9 (L) 25.1 - 33.5 pg    MCHC 32.7 31.5 - 35.8 g/dL    RDW 15 11 - 15 %    MPV 10.0 8.9 - 12.5 fL    Instrument Absolute Neutrophil Count 14.12 (H) 1.10 - 6.33 x10 3/uL     Neutrophils 81.7 None %    Lymphocytes Automated 7.8 None %    Monocytes 9.4 None %    Eosinophils Automated 0.3 None %    Basophils Automated 0.2 None %    Immature Granulocytes 0.6 None %    Nucleated RBC 0.0 0.0 - 0.0 /100 WBC    Neutrophils Absolute 14.12 (H) 1.10 - 6.33 x10 3/uL    Lymphocytes Absolute Automated 1.34 0.42 - 3.22 x10 3/uL    Monocytes Absolute Automated 1.62 (H) 0.21 - 0.85 x10 3/uL    Eosinophils Absolute Automated 0.06 0.00 - 0.44 x10 3/uL    Basophils Absolute Automated 0.03 0.00 - 0.08 x10 3/uL    Immature Granulocytes Absolute 0.10 (H) 0.00 - 0.07 x10 3/uL    Absolute NRBC 0.00 0.00 - 0.00 x10 3/uL   Comprehensive metabolic panel    Collection Time: 12/11/21  5:41 AM   Result Value Ref Range    Glucose 106 (H) 70 - 100 mg/dL    BUN 8.0 7.0 - 04.5 mg/dL    Creatinine 0.8 0.4 - 1.0 mg/dL    Sodium 409 811 - 914 mEq/L    Potassium 3.6 3.5 - 5.3 mEq/L    Chloride 106 99 - 111 mEq/L    CO2 22 17 - 29 mEq/L    Calcium 8.6 8.5 - 10.5 mg/dL    Protein, Total 6.8 6.0 - 8.3 g/dL    Albumin 3.3 (L) 3.5 - 5.0 g/dL    AST (SGOT) 23 5 - 41 U/L    ALT 22 0 - 55 U/L    Alkaline Phosphatase 69 37 - 117 U/L    Bilirubin, Total 0.7 0.2 - 1.2 mg/dL    Globulin 3.5 2.0 - 3.6 g/dL    Albumin/Globulin Ratio 0.9 0.9 - 2.2    Anion Gap 8.0 5.0 - 15.0   Lactic Acid    Collection Time: 12/11/21  5:41 AM   Result Value Ref Range    Lactic Acid 0.8 0.2 - 2.0 mmol/L   Sedimentation rate (ESR)    Collection Time: 12/11/21  5:41 AM   Result Value Ref Range    Sed Rate 36 (H) 0 -  20 mm/Hr   C Reactive Protein    Collection Time: 12/11/21  5:41 AM   Result Value Ref Range    C-Reactive Protein 16.7 (H) 0.0 - 1.1 mg/dL   Beta HCG, Qual, Serum    Collection Time: 12/11/21  5:41 AM   Result Value Ref Range    Hcg Qualitative Negative Negative   GFR    Collection Time: 12/11/21  5:41 AM   Result Value Ref Range    EGFR >60.0    COVID-19 (SARS-CoV-2) only ([facility]) asymptomatic admission    Collection Time: 12/11/21  7:44 AM     Specimen: Nasopharyngeal   Result Value Ref Range    Purpose of COVID testing Screening     SARS-CoV-2 Specimen Source Nasal Swab     SARS CoV 2 Overall Result Not Detected        MICROBIOLOGY:  Blood Culture: done; result pending  Urine Culture: No indicated  Antibiotics Started: Unasyn    IMAGING (xray images personally reviewed and concur with radiologist unless otherwise stated below):  US Pelvis Complete   Final Result         1. Fibroid uterus.   2. Probable right hydrosalpinx.   3. Left ovary not visualized.       Georgiana Spinner, MD   12/11/2021 12:35 PM      CT Lower Extremity Right W Contrast   Final Result      1.Subcutaneous infiltration/edema and skin thickening along the medial   proximal thigh consistent with soft tissue infection such as cellulitis.   No drainable fluid collections or soft tissue gas.      2.Incomplete characterization of a pelvic mass. Recommend further   evaluation with follow-up pelvic ultrasound.      These results /recommendations were discussed with and acknowledged by   Everette Rank, MD on 12/11/2021 8:21 AM.      Emeline Darling, MD   12/11/2021 8:22 AM          CARDIAC:  EKG Interpretation (on my review 3): not indicated    Markers: Not indicated        EMERGENCY DEPARTMENT COURSE:  Orders Placed This Encounter   Procedures    Culture Blood Aerobic and Anaerobic    Culture Blood Aerobic and Anaerobic    COVID-19 (SARS-CoV-2) only ([facility]) asymptomatic admission    CT Lower Extremity Right W Contrast    US Pelvis Complete    CBC and differential    Comprehensive metabolic panel    Lactic Acid    Sedimentation rate (ESR)    C Reactive Protein    Beta HCG, Qual, Serum    GFR    Inpatient consult to infectious diseases    Adult Admit to Observation       ASSESSMENT & PLAN       Journee Tennille Montelongo is a 36 y.o. female with a PMHx of Anxiety and HTN who presented with complaining of right inner upper thigh swelling and redness that started four days ago. Despite out patient Oral  antibiotics, she continued to have worsening of her symptoms and developed new onset of chills.     1. Sepsis due to Cellulitis; Not responding to po antibiotics; Right lower extremity CT finding: consistent with Soft Tissues soft tissue infection such as cellulitis. WBC 17.27;      SIRS present? Yes, SIRS criteria was positive in the ER for Elevated RR and WBC. Lactic was negative and blood culture was send, a  dose vancomycin was given in the ER.  Started Unasyn 1.5 gm; will follow up after blood culture     2. Fibroid uterus: accidental finding of pelvic mass and follow [place] noted fibroid uterus. The pt states that she has chronic fibroids.     Will follow up as out patient with her OB.      3. Hypertension    Continue home med    4. Anxiety     Continue home med.    FEN:   Fluid: Good PO intake  Electrolytes: Noted  Nutrition:Cardiac  .         There is no height or weight on file to calculate BMI.     Monitoring:  Continuous pulse oximetry:  Telemetry needed:  Level of care:      CODE STATUS: full code  Surrogate decision maker or Advanced Care Plan: No advance care plan; CDM: Sister Corrie Dandy, at the bed side     PRIMARY CARE MD: Pcp, None, MD    Safety Checklist  DVT prophylaxis: Chemical   Foley: Not present   IVs:  Peripheral IV   PT/OT: Not needed   Daily CBC & or Chem ordered: Yes, due to clinical and lab instability       Patient Lines/Drains/Airways Status       Active PICC Line / CVC Line / PIV Line / Drain / Airway / Intraosseous Line / Epidural Line / ART Line / Line / Wound / Pressure Ulcer / NG/OG Tube       Name Placement date Placement time Site Days    Peripheral IV 12/11/21 20 G Right Antecubital 12/11/21  0538  Antecubital  less than 1                        Signed,  Lavena Stanford, FNP  12/11/2021 12:52 PM

## 2021-12-11 NOTE — Progress Notes (Signed)
Admission Note-  Patient admitted to Medical Room 496 at 1320. Hand off report received from ED ISHAPED.   Patient oriented to room, bed in lowest position, bed alarm activated, call bell within reach.   Patient informed of visitor policy.  Belongings - clothing/shoes, purse, wallet, phone/charger.      Brief Clinical Picture:  Neuro A&O: x4  Resp: WNL. Lung sounds clear. RA.   GI: WNL. BM yesterday.   Foley: n/a  Central Line: n/a   PIV: 20G R AC, site and dressing CDI.      Skin Assessment  Two nurse skin assessment performed with: [place]   Areas observed with redness or injury: scattered tattoos, cracking to bilateral heels, blanchable redness to R upper inner thigh (cellulitis)     Devices present: n/a skin integrity under device: n/a  Braden score <15. No. Score of 20  Actions taken: n/a     Any significant admission event: n/a    Pt reports there was a small knot on her R upper inner thigh on Wednesday. States it started to get bigger on Thursday. Went to ER yesterday and discharged her with pain medication and an antibiotic. Started pt on Unasyn IVPB q6 hr.

## 2021-12-11 NOTE — Plan of Care (Signed)
Pt Alert and oriented x4. Vital signs stable. Pt c/o severe burning pain at her right inner upper thigh, controlled with Percocet and Morphine. Pt was in tears reportedly from the pain and frustration with the situation. She called her "close friend," Dr. Rennis Harding, hem/onc Physician, in [place] and had me join the conversation. She described to him the swelling on her thigh as a hard lump and with a burning pain. He expressed his concern about the swelling and wanted to join in the conversation during providers round. His phone # [phone].      Problem: Safety  Goal: Patient will be free from injury during hospitalization  Outcome: Progressing  Flowsheets (Taken 12/11/2021 1630 by Jolinda Croak, RN)  Patient will be free from injury during hospitalization:   Assess patient's risk for falls and implement fall prevention plan of care per policy   Provide and maintain safe environment   Use appropriate transfer methods   Hourly rounding   Include patient/ family/ care giver in decisions related to safety   Ensure appropriate safety devices are available at the bedside   Assess for patients risk for elopement and implement Elopement Risk Plan per policy   Provide alternative method of communication if needed (communication boards, writing)     Problem: Pain  Goal: Pain at adequate level as identified by patient  Outcome: Progressing  Flowsheets (Taken 12/11/2021 1630 by Jolinda Croak, RN)  Pain at adequate level as identified by patient:   Identify patient comfort function goal   Assess pain on admission, during daily assessment and/or before any "as needed" intervention(s)   Reassess pain within 30-60 minutes of any procedure/intervention, per Pain Assessment, Intervention, Reassessment (AIR) Cycle   Evaluate if patient comfort function goal is met   Evaluate patient's satisfaction with pain management progress   Offer non-pharmacological pain management interventions   Include patient/patient care  companion in decisions related to pain management as needed     Problem: Infection  Goal: Free from infection  Outcome: Progressing  Flowsheets (Taken 12/11/2021 1630 by Jolinda Croak, RN)  Free from infection: Assess for signs/symptoms of infection

## 2021-12-11 NOTE — ED Provider Notes (Signed)
History     Chief Complaint   Patient presents with    Rash     HPI     Patient presents tonight with stating that she has had a abscess to her right thigh for about a week.  States that she has been on oral clindamycin, and that it is not improving and that the area to her thigh is actually getting worse.  No fevers, states that she has had nausea and vomiting.  Was seen here yesterday, I was told to continue the clindamycin but states that the pain got worse tonight as well as the swelling to her leg worsened.  Is unsure if she has had any fevers.  Dates that she has had previous abscesses in the past however states has not had them in several years.  Denies any IV drug use.    Past Medical History:   Diagnosis Date    Anxiety     Depression     Endometriosis        Past Surgical History:   Procedure Laterality Date    LAPAROTOMY, SALPINGO-OOPHERECTOMY Left        No family history on file.    Social  Social History     Tobacco Use    Smoking status: Never     Passive exposure: Never    Smokeless tobacco: Never   Vaping Use    Vaping Use: Never used   Substance Use Topics    Alcohol use: Yes    Drug use: Never       .     No Known Allergies    Home Medications       Med List Status: Complete Set By: Jolinda Croak, RN at 12/11/2021  1:07 PM              clindamycin (CLEOCIN) 300 MG capsule          DULoxetine (CYMBALTA) 20 MG capsule          NIFEdipine XL (PROCARDIA XL) 30 MG 24 hr tablet     Take 30 mg by mouth daily     oxyCODONE (ROXICODONE) 5 MG immediate release tablet     Take 1 tablet (5 mg) by mouth every 6 (six) hours as needed for Pain             Review of Systems   All other systems reviewed and are negative.    Physical Exam    BP: 127/77, Heart Rate: 89, Temp: 98.5 F (36.9 C), Resp Rate: (!) 24, SpO2: 99 %, Weight: 115 kg    Physical Exam  Vitals and nursing note reviewed.   Constitutional:       Appearance: Normal appearance.      Comments: Appears uncomfortable   Cardiovascular:      Rate  and Rhythm: Normal rate.   Pulmonary:      Effort: Pulmonary effort is normal.   Abdominal:      Palpations: Abdomen is soft.      Tenderness: There is no abdominal tenderness. There is no right CVA tenderness, left CVA tenderness, guarding or rebound.   Musculoskeletal:      Cervical back: Normal range of motion and neck supple.        Legs:    Skin:     Capillary Refill: Capillary refill takes less than 2 seconds.   Neurological:      General: No focal deficit present.      Mental Status: She is  alert and oriented to person, place, and time.         MDM and ED Course     ED Medication Orders (From admission, onward)      Start Ordered     Status Ordering Provider    12/11/21 1148 12/11/21 1147  morphine injection 4 mg  Once        Route: Intravenous  Ordered Dose: 4 mg     Last MAR action: Given RHEE, JAMES    12/11/21 1148 12/11/21 1147  ondansetron (ZOFRAN) injection 4 mg  Once        Route: Intravenous  Ordered Dose: 4 mg     Last MAR action: Given RHEE, JAMES    12/11/21 0716 12/11/21 0716  iodixanol (VISIPAQUE) 320 MG/ML injection 100 mL  IMG once as needed        Route: Intravenous  Ordered Dose: 100 mL     Last MAR action: Imaging Agent Given Rosamaria Lints    12/11/21 0704 12/11/21 0703  vancomycin (VANCOCIN) 1500 mg in sodium chloride 0.9% 500 mL (premix)  Once        Route: Intravenous  Ordered Dose: 1,500 mg     Last MAR action: Stopped Ellana Kawa A    12/11/21 0532 12/11/21 0531  sodium chloride 0.9 % bolus 1,000 mL  Once        Route: Intravenous  Ordered Dose: 1,000 mL     Last MAR action: Stopped Tanasha Menees A    12/11/21 0532 12/11/21 0531  ondansetron (ZOFRAN) injection 4 mg  Once        Route: Intravenous  Ordered Dose: 4 mg     Last MAR action: Given Babara Buffalo A    12/11/21 0532 12/11/21 0531  morphine injection 4 mg  Once        Route: Intravenous  Ordered Dose: 4 mg     Last MAR action: Given Dary Dilauro A    12/11/21 0532 12/11/21 0531  ketorolac (TORADOL) injection 15 mg  Once         Route: Intravenous  Ordered Dose: 15 mg     Last MAR action: Given Birgitta Uhlir A               Medical Decision Making  Amount and/or Complexity of Data Reviewed  Labs: ordered.  Radiology: ordered.    Risk  Prescription drug management.        ED Course as of 12/11/21 2108   Sat Dec 11, 2021   0726 Patient signed out to Nettie Elm PA pending CT scan and reassessment. Will likely need admission.  [LL]      ED Course User Index  [LL] Effie Shy, FNP             Procedures    Clinical Impression & Disposition     Clinical Impression  Final diagnoses:   Cellulitis, unspecified cellulitis site   Leukocytosis, unspecified type   Pain of left lower extremity   Pelvic mass        ED Disposition       ED Disposition   Observation    Condition   --    Date/Time   Sat Dec 11, 2021 11:47 AM    Comment   Admitting Physician: Manning Charity [id number]   Service:: Medicine [106]   Estimated Length of Stay: < 2 midnights   Tentative Discharge Plan?: Home or Self Care [1]   Does patient need  telemetry?: No                  Current Discharge Medication List                      Effie Shy, FNP  12/11/21 2108       Rosamaria Lints, MD  12/12/21 2051

## 2021-12-11 NOTE — ED Notes (Signed)
[facility] EMERGENCY DEPARTMENT  ED NURSING NOTE FOR THE RECEIVING INPATIENT NURSE   ED NURSE Juluis Rainier [phone]   ED CHARGE RN Jae Dire   ADMISSION INFORMATION   Linda [name] is a 36 y.o. female admitted with an ED diagnosis of:    1. Cellulitis, unspecified cellulitis site    2. Leukocytosis, unspecified type    3. Pain of left lower extremity    4. Pelvic mass         Isolation: None   Allergies: Patient has no known allergies.   Holding Orders confirmed? Yes   Belongings Documented? Yes   Home medications sent to pharmacy confirmed? N/A   NURSING CARE   Patient Comes From:   Mental Status: Home Independent  alert and oriented   ADL: Independent with all ADLs   Ambulation: no difficulty   Pertinent Information  and Safety Concerns: Pt home key is at ER registration to be picked up by family/friend     CT / NIH   CT Head ordered on this patient?  No   NIH/Dysphagia assessment done prior to admission? No   VITAL SIGNS (at the time of this note)      Vitals:    12/11/21 1229   BP: 141/78   Pulse: 91   Resp:    Temp: 98.3 F (36.8 C)   SpO2: 99%

## 2021-12-11 NOTE — ED Provider Notes (Signed)
ED Signout     Patient received from: Verita Schneiders at 0700    Signout notes: Pending labs and CT      Diagnostic Study Results     The results of the diagnostic studies below were reviewed by the ED provider:    Labs  Results       Procedure Component Value Units Date/Time    COVID-19 (SARS-CoV-2) only ([facility]) asymptomatic admission [[id number]] Collected: 12/11/21 0744    Specimen: Nasopharyngeal Updated: 12/11/21 0811     Purpose of COVID testing Screening     SARS-CoV-2 Specimen Source Nasal Swab     SARS CoV 2 Overall Result Not Detected    Narrative:      o Collect and clearly label specimen type:  o PREFERRED-Upper respiratory specimen: One Nasal Swab in  Transport Media.  o Hand deliver to laboratory ASAP  Indication for testing->Extended care facility admission to  semi private room  Screening    Comprehensive metabolic panel [[id number]]  (Abnormal) Collected: 12/11/21 0541    Specimen: Blood Updated: 12/11/21 0614     Glucose 106 mg/dL      BUN 8.0 mg/dL      Creatinine 0.8 mg/dL      Sodium 308 mEq/L      Potassium 3.6 mEq/L      Chloride 106 mEq/L      CO2 22 mEq/L      Calcium 8.6 mg/dL      Protein, Total 6.8 g/dL      Albumin 3.3 g/dL      AST (SGOT) 23 U/L      ALT 22 U/L      Alkaline Phosphatase 69 U/L      Bilirubin, Total 0.7 mg/dL      Globulin 3.5 g/dL      Albumin/Globulin Ratio 0.9     Anion Gap 8.0    C Reactive Protein [[id number]]  (Abnormal) Collected: 12/11/21 0541    Specimen: Blood Updated: 12/11/21 0614     C-Reactive Protein 16.7 mg/dL     GFR [[id number]] Collected: 12/11/21 0541     Updated: 12/11/21 0614     EGFR >60.0    Lactic Acid [[id number]] Collected: 12/11/21 0541    Specimen: Blood Updated: 12/11/21 0611     Lactic Acid 0.8 mmol/L     Beta HCG, Qual, Serum [[id number]] Collected: 12/11/21 0541    Specimen: Blood Updated: 12/11/21 0610     Hcg Qualitative Negative    Sedimentation rate (ESR) [[id number]]  (Abnormal) Collected: 12/11/21 0541    Specimen: Blood Updated:  12/11/21 0605     Sed Rate 36 mm/Hr     Culture Blood Aerobic and Anaerobic [[id number]] Collected: 12/11/21 0600    Specimen: Arm from Blood, Venipuncture Updated: 12/11/21 0602    Narrative:      1 BLUE+1 PURPLE    Culture Blood Aerobic and Anaerobic [[id number]] Collected: 12/11/21 0600    Specimen: Arm from Blood, Venipuncture Updated: 12/11/21 0602    Narrative:      1 BLUE+1 PURPLE    CBC and differential [[id number]]  (Abnormal) Collected: 12/11/21 0541    Specimen: Blood Updated: 12/11/21 0549     WBC 17.27 x10 3/uL      Hgb 11.8 g/dL      Hematocrit 55.7 %      Platelets 283 x10 3/uL      RBC 4.93 x10 6/uL      MCV 73.2 fL      MCH  23.9 pg      MCHC 32.7 g/dL      RDW 15 %      MPV 10.0 fL      Instrument Absolute Neutrophil Count 14.12 x10 3/uL      Neutrophils 81.7 %      Lymphocytes Automated 7.8 %      Monocytes 9.4 %      Eosinophils Automated 0.3 %      Basophils Automated 0.2 %      Immature Granulocytes 0.6 %      Nucleated RBC 0.0 /100 WBC      Neutrophils Absolute 14.12 x10 3/uL      Lymphocytes Absolute Automated 1.34 x10 3/uL      Monocytes Absolute Automated 1.62 x10 3/uL      Eosinophils Absolute Automated 0.06 x10 3/uL      Basophils Absolute Automated 0.03 x10 3/uL      Immature Granulocytes Absolute 0.10 x10 3/uL      Absolute NRBC 0.00 x10 3/uL             Radiologic Studies  Radiology Results (24 Hour)       Procedure Component Value Units Date/Time    US Pelvis Complete [[id number]] Resulted: 12/11/21 0940    Order Status: Sent Updated: 12/11/21 0940    CT Lower Extremity Right W Contrast [[id number]] Collected: 12/11/21 0812    Order Status: Completed Updated: 12/11/21 0824    Narrative:      HISTORY: Right upper thigh cellulitis not responding to antibiotics.  Evaluate for abscess.    COMPARISON: None.    TECHNIQUE: CT of the right thigh performed with 100 mL of Visipaque 320  intravenous contrast. Multiplanar reconstructions were created and  reviewed. The following dose reduction  techniques were utilized:  automated exposure control and/or adjustment of the mA and/or KV  according to patient size, and the use of an iterative reconstruction  technique.    FINDINGS:  Bones: No acute fracture is identified. The alignment is normal.    Soft Tissues: There is subcutaneous infiltration/edema and skin  thickening along the medial proximal thigh consistent with soft tissue  infection such as cellulitis. No drainable fluid collections or soft  tissue gas. Muscle bulk and density within normal limits.    There is a left posterior pelvic mass measuring up to 8 cm which is  incompletely characterized.      Impression:        1.Subcutaneous infiltration/edema and skin thickening along the medial  proximal thigh consistent with soft tissue infection such as cellulitis.  No drainable fluid collections or soft tissue gas.    2.Incomplete characterization of a pelvic mass. Recommend further  evaluation with follow-up pelvic ultrasound.    These results /recommendations were discussed with and acknowledged by  Everette Rank, MD on 12/11/2021 8:21 AM.    Emeline Darling, MD  12/11/2021 8:22 AM            Vital Signs  BP 134/68   Pulse 79   Temp 98.2 F (36.8 C) (Oral)   Resp (!) 24   Wt 115 kg   LMP 11/12/2021   SpO2 100%   Patient Vitals for the past 24 hrs:   BP Temp Temp src Pulse Resp SpO2 Weight   12/11/21 1017 134/68 -- -- 79 -- 100 % --   12/11/21 0934 -- 98.2 F (36.8 C) Oral -- -- -- --   12/11/21 0736 128/79 -- -- 78 -- 100 % --  12/11/21 0708 -- -- -- 86 -- 98 % --   12/11/21 0645 -- -- -- 74 -- 99 % --   12/11/21 0602 -- -- -- -- -- 99 % --   12/11/21 0601 -- -- -- -- -- 99 % --   12/11/21 0600 -- -- -- 80 -- 99 % --   12/11/21 0445 127/77 98.5 F (36.9 C) Oral 89 (!) 24 99 % 115 kg         Clinical Course / MDM     Notes:   Patient with history of anxiety, high blood pressure, here for reevaluation of area of swelling to the right inner upper thigh.  States that she was seen at urgent care  approximately 3 to 4 days ago for initial evaluation was given clindamycin which she has been taking ever since.  States that she was seen here yesterday for evaluation had labs drawn which showed white count elevation.  Was advised to continue clindamycin and return for any concerns or worsening symptoms.  Patient returns today for worsening symptoms. Felt as if she had fevers, no temp taken, but noted today had chills.     Tenderness to palpation on physical exam with no active discharge.    Pending labs and CT imaging for evaluation of abscess.     CT shows no definable abscess nothing to drain.  Noted ill-defined pelvic mass.  Ultrasound was ordered per recommendations.  Discussed these results with the patient who is concerned about how large this is grown in with development of chills.    Discussed with hospitalist will admit          Clinical Impression:  1. Cellulitis, unspecified cellulitis site    2. Leukocytosis, unspecified type    3. Pain of left lower extremity    4. Pelvic mass        ED Disposition       ED Disposition   Observation    Condition   --    Date/Time   Sat Dec 11, 2021 11:47 AM    Comment   Admitting Physician: Manning Charity [[id number]]   Service:: Medicine [[id number]]   Estimated Length of Stay: < 2 midnights   Tentative Discharge Plan?: Home or Self Care [1]   Does patient need telemetry?: No                    Nettie Elm, [place]  12/11/21 1148

## 2021-12-11 NOTE — Plan of Care (Signed)
Started on IVPB Unasyn q6 hr. Pt c/o increased pain in R upper inner thigh. Prn morphine provided to pt with mild relief. Getting lovenox for DVT prophylaxis.     Low fall risk per protocol. Call bell and bedside table within reach. Bed in lowest position and wheels locked. Pt is independent with ambulation.       Problem: Safety  Goal: Patient will be free from injury during hospitalization  Outcome: Progressing  Flowsheets (Taken 12/11/2021 1630)  Patient will be free from injury during hospitalization:   Assess patient's risk for falls and implement fall prevention plan of care per policy   Provide and maintain safe environment   Use appropriate transfer methods   Hourly rounding   Include patient/ family/ care giver in decisions related to safety   Ensure appropriate safety devices are available at the bedside   Assess for patients risk for elopement and implement Elopement Risk Plan per policy   Provide alternative method of communication if needed (communication boards, writing)  Goal: Patient will be free from infection during hospitalization  Outcome: Progressing  Flowsheets (Taken 12/11/2021 1630)  Free from Infection during hospitalization:   Assess and monitor for signs and symptoms of infection   Monitor lab/diagnostic results   Monitor all insertion sites (i.e. indwelling lines, tubes, urinary catheters, and drains)   Encourage patient and family to use good hand hygiene technique     Problem: Pain  Goal: Pain at adequate level as identified by patient  Outcome: Progressing  Flowsheets (Taken 12/11/2021 1630)  Pain at adequate level as identified by patient:   Identify patient comfort function goal   Assess pain on admission, during daily assessment and/or before any "as needed" intervention(s)   Reassess pain within 30-60 minutes of any procedure/intervention, per Pain Assessment, Intervention, Reassessment (AIR) Cycle   Evaluate if patient comfort function goal is met   Evaluate patient's satisfaction  with pain management progress   Offer non-pharmacological pain management interventions   Include patient/patient care companion in decisions related to pain management as needed     Problem: Side Effects from Pain Analgesia  Goal: Patient will experience minimal side effects of analgesic therapy  Outcome: Progressing  Flowsheets (Taken 12/11/2021 1630)  Patient will experience minimal side effects of analgesic therapy:   Monitor/assess patient's respiratory status (RR depth, effort, breath sounds)   Assess for changes in cognitive function   Prevent/manage side effects per LIP orders (i.e. nausea, vomiting, pruritus, constipation, urinary retention, etc.)   Evaluate for opioid-induced sedation with appropriate assessment tool (i.e. POSS)     Problem: Discharge Barriers  Goal: Patient will be discharged home or other facility with appropriate resources  Outcome: Progressing  Flowsheets (Taken 12/11/2021 1630)  Discharge to home or other facility with appropriate resources:   Provide appropriate patient education   Provide information on available health resources   Initiate discharge planning     Problem: Psychosocial and Spiritual Needs  Goal: Demonstrates ability to cope with hospitalization/illness  Outcome: Progressing  Flowsheets (Taken 12/11/2021 1630)  Demonstrates ability to cope with hospitalizations/illness:   Encourage verbalization of feelings/concerns/expectations   Provide quiet environment   Include patient/ patient care companion in decisions     Problem: Inadequate Cardiac Output  Goal: Adequate tissue perfusion will be maintained  Outcome: Progressing  Flowsheets (Taken 12/11/2021 1630)  Adequate tissue perfusion will be maintained:   Monitor/assess vital signs   Monitor/assess for signs of VTE (edema of calf/thigh redness, pain)   Monitor/assess neurovascular  status (pulses, capillary refill, pain, paresthesia, paralysis, presence of edema)   Monitor intake and output   Monitor for signs and symptoms  of a pulmonary embolism (dyspnea, tachypnea, tachycardia, confusion)   VTE Prevention: Administer anticoagulant(s) and/or apply anti-embolism stockings/devices as ordered   Increase mobility as tolerated/progressive mobility     Problem: Infection  Goal: Free from infection  Outcome: Progressing  Flowsheets (Taken 12/11/2021 1630)  Free from infection: Assess for signs/symptoms of infection     Problem: Compromised skin integrity  Goal: Skin integrity is maintained or improved  Outcome: Progressing  Flowsheets (Taken 12/11/2021 1630)  Skin integrity is maintained or improved:   Assess Braden Scale every shift   Turn or reposition patient every 2 hours or as needed unless able to reposition self   Increase activity as tolerated/progressive mobility   Keep skin clean and dry   Relieve pressure to bony prominences     Problem: Nutrition  Goal: Nutritional intake is adequate  Outcome: Progressing  Flowsheets (Taken 12/11/2021 1630)  Nutritional intake is adequate:   Assist patient with meals/food selection   Allow adequate time for meals   Include patient/patient care companion in decisions related to nutrition   Assess anorexia, appetite, and amount of meal/food tolerated

## 2021-12-12 ENCOUNTER — Observation Stay: Payer: Commercial Managed Care - POS

## 2021-12-12 LAB — CBC AND DIFFERENTIAL
Absolute NRBC: 0 10*3/uL (ref 0.00–0.00)
Basophils Absolute Automated: 0.03 10*3/uL (ref 0.00–0.08)
Basophils Automated: 0.2 %
Eosinophils Absolute Automated: 0.13 10*3/uL (ref 0.00–0.44)
Eosinophils Automated: 0.8 %
Hematocrit: 35.2 % (ref 34.7–43.7)
Hgb: 11.1 g/dL — ABNORMAL LOW (ref 11.4–14.8)
Immature Granulocytes Absolute: 0.13 10*3/uL — ABNORMAL HIGH (ref 0.00–0.07)
Immature Granulocytes: 0.8 %
Instrument Absolute Neutrophil Count: 13 10*3/uL — ABNORMAL HIGH (ref 1.10–6.33)
Lymphocytes Absolute Automated: 0.86 10*3/uL (ref 0.42–3.22)
Lymphocytes Automated: 5.5 %
MCH: 24 pg — ABNORMAL LOW (ref 25.1–33.5)
MCHC: 31.5 g/dL (ref 31.5–35.8)
MCV: 76.2 fL — ABNORMAL LOW (ref 78.0–96.0)
MPV: 10.8 fL (ref 8.9–12.5)
Monocytes Absolute Automated: 1.4 10*3/uL — ABNORMAL HIGH (ref 0.21–0.85)
Monocytes: 9 %
Neutrophils Absolute: 13 10*3/uL — ABNORMAL HIGH (ref 1.10–6.33)
Neutrophils: 83.7 %
Nucleated RBC: 0 /100 WBC (ref 0.0–0.0)
Platelets: 265 10*3/uL (ref 142–346)
RBC: 4.62 10*6/uL (ref 3.90–5.10)
RDW: 15 % (ref 11–15)
WBC: 15.55 10*3/uL — ABNORMAL HIGH (ref 3.10–9.50)

## 2021-12-12 MED ORDER — DOCUSATE SODIUM 100 MG PO CAPS
100.0000 mg | ORAL_CAPSULE | Freq: Two times a day (BID) | ORAL | Status: DC
Start: 2021-12-12 — End: 2021-12-15
  Administered 2021-12-12 – 2021-12-15 (×6): 100 mg via ORAL
  Filled 2021-12-12 (×7): qty 1

## 2021-12-12 NOTE — Progress Notes (Signed)
[facility]  HOSPITALIST  PROGRESS NOTE      Patient: Linda [name]  Date: 12/12/2021   LOS: 0 Days  Admission Date: 12/11/2021   MRN: [id number]  Attending:  Manning Charity, MD     ASSESSMENT/PLAN     Linda [name] is a 36 y.o. female admitted with ...      Sepsis present in ED due to cellulitis right upper inner thigh:  - failed outpatient treatment  - imaging noted  - dopplers pending  - leukocytosis noted. Trending  - on unasyn and vanc  - blood cultures  negative so far  - MRSA screen pending   - Crp and esr elevated. Will trend  Fibroid uterus: noted on imaging. Follow-up outpatient  HTN: home rx continued  Anxiety: home rx continued  Microcytic anemia: will check ferritin and iron panel  Constipation: colace added 12/12/21    Analgesia: tylenol, percocet, advil    FEN  Fluids: not needed  Electrolytes: noted  Nutrition: Cardiac Diet  Body mass index is 40.92 kg/m. Obesity class III: noted        Monitoring:  Telemetry needed: no  Continuous pulse oximetry needed: no  Level of Care: 4      Safety Checklist  DVT prophylaxis: Chemical   Foley: Not present   IVs:  Peripheral IV   PT/OT: Not needed   Daily CBC & or Chem ordered: Yes, due to clinical and lab instability       Patient Lines/Drains/Airways Status       Active PICC Line / CVC Line / PIV Line / Drain / Airway / Intraosseous Line / Epidural Line / ART Line / Line / Wound / Pressure Ulcer / NG/OG Tube       Name Placement date Placement time Site Days    Peripheral IV 12/11/21 20 G Right Antecubital 12/11/21  0538  Antecubital  1    Peripheral IV 12/11/21 20 G Left Antecubital 12/11/21  --  Antecubital  1                    MD/RN rounds: yes       Code Status: full code    DISPO: home with family    Family Contact: sister    Care Plan discussed with nursing, consultants, case Production designer, theatre/television/film.       SUBJECTIVE     Linda [name] states she has been having a headache and chills off and on. Swelling and pain in leg is about the same. No drainage. Friend  updated by phone (on speaker phone with patient present).     MEDICATIONS     Current Facility-Administered Medications   Medication Dose Route Frequency    ampicillin-sulbactam  1.5 g Intravenous Q6H    docusate sodium  100 mg Oral BID    DULoxetine  20 mg Oral Daily    enoxaparin  40 mg Subcutaneous Daily    NIFEdipine XL  30 mg Oral Daily       ROS     All other systems not mentioned in subjective reviewed and negative    PHYSICAL EXAM     Vitals:    12/12/21 1147   BP: 109/69   Pulse: (!) 101   Resp: 16   Temp: 99.9 F (37.7 C)   SpO2: 99%       Temperature: Temp  Min: 97.9 F (36.6 C)  Max: 101.4 F (38.6 C)  Pulse: Pulse  Min: 73  Max: 101  Respiratory: Resp  Min: 16  Max: 18  Non-Invasive BP: BP  Min: 104/64  Max: 136/83  Pulse Oximetry SpO2  Min: 99 %  Max: 100 %    Intake and Output Summary (Last 24 hours) at Date Time  No intake or output data in the 24 hours ending 12/12/21 1431    GEN APPEARANCE: No acute distress  HEENT: Conjunctiva Clear  NECK: no masses  CVS: RRR, S1, S2; No Murmur  LUNGS: CTAB  ABD: Soft; No TTP; + Normoactive BS  EXT: No edema  SKIN: localized area of induration and tenderness right upper inner thigh distal to groin crease  NEURO:  No Focal neurological deficits      LABS     Recent Labs   Lab 12/12/21  0551 12/11/21  0541 12/10/21  0821   WBC 15.55* 17.27* 14.45*   RBC 4.62 4.93 5.15*   Hgb 11.1* 11.8 12.3   Hematocrit 35.2 36.1 38.1   MCV 76.2* 73.2* 74.0*   Platelets 265 283 300       Recent Labs   Lab 12/11/21  0541 12/10/21  0821   Sodium 136 136   Potassium 3.6 3.9   Chloride 106 105   CO2 22 23   BUN 8.0 10.0   Creatinine 0.8 0.8   Glucose 106* 101*   Calcium 8.6 9.0       Recent Labs   Lab 12/11/21  0541 12/10/21  0821   ALT 22 16   AST (SGOT) 23 20   Bilirubin, Total 0.7 0.5   Albumin 3.3* 3.5   Alkaline Phosphatase 69 70                   Microbiology Results (last 15 days)       Procedure Component Value Units Date/Time    MRSA culture [[id number]] Collected: 12/12/21  1049    Order Status: Sent Specimen: Culturette from Nasal Swab Updated: 12/12/21 1051    MRSA culture [[id number]] Collected: 12/12/21 1049    Order Status: Sent Specimen: Culturette from Throat Updated: 12/12/21 1051    COVID-19 (SARS-CoV-2) only ([facility]) asymptomatic admission [[id number]] Collected: 12/11/21 0744    Order Status: Completed Specimen: Nasopharyngeal Updated: 12/11/21 0811     Purpose of COVID testing Screening     SARS-CoV-2 Specimen Source Nasal Swab     SARS CoV 2 Overall Result Not Detected     Comment: __________________________________________________  -A result of "Detected" indicates POSITIVE for the    presence of SARS CoV-2 RNA  -A result of "Not Detected" indicates NEGATIVE for the    presence of SARS CoV-2 RNA  __________________________________________________________  Test performed using the Roche cobas Liat SARS-CoV-2 assay. This assay is  only for use under the Food and Drug Administration's Emergency Use  Authorization. This is a real-time RT-PCR assay for the qualitative  detection of SARS-CoV-2 RNA. Viral nucleic acids may persist in vivo,  independent of viability. Detection of viral nucleic acid does not imply the  presence of infectious virus, or that virus nucleic acid is the cause of  clinical symptoms. Negative results do not preclude SARS-CoV-2 infection and  should not be used as the sole basis for diagnosis, treatment or other  patient management decisions. Negative results must be combined with  clinical observations, patient history, and/or epidemiological information.  Invalid results may be due to inhibiting substances in the specimen and  recollection should occur. Please see Fact Sheets for patients  and providers  located:  [employer]log.no         Narrative:      o Collect and clearly label specimen type:  o PREFERRED-Upper respiratory specimen: One Nasal Swab in  Transport Media.  o Hand deliver to laboratory ASAP  Indication for  testing->Extended care facility admission to  semi private room  Screening    Culture Blood Aerobic and Anaerobic [[id number]] Collected: 12/11/21 0600    Order Status: Completed Specimen: Arm from Blood, Venipuncture Updated: 12/12/21 1421    Narrative:      ORDER#: [id number]                                    ORDERED BY: Darcel Bayley, LISA  SOURCE: Blood, Venipuncture arm                      COLLECTED:  12/11/21 06:00  ANTIBIOTICS AT COLL.:                                RECEIVED :  12/11/21 13:47  Culture Blood Aerobic and Anaerobic        PRELIM      12/12/21 14:21  12/12/21   No Growth after 1 day/s of incubation.      Culture Blood Aerobic and Anaerobic [[id number]] Collected: 12/11/21 0600    Order Status: Completed Specimen: Arm from Blood, Venipuncture Updated: 12/12/21 1421    Narrative:      ORDER#: [id number]                                    ORDERED BY: Darcel Bayley, LISA  SOURCE: Blood, Venipuncture arm                      COLLECTED:  12/11/21 06:00  ANTIBIOTICS AT COLL.:                                RECEIVED :  12/11/21 13:47  Culture Blood Aerobic and Anaerobic        PRELIM      12/12/21 14:21  12/12/21   No Growth after 1 day/s of incubation.               RADIOLOGY     Radiological Procedure xray personally reviewed and concur with radiologist reports unless stated otherwise.    US Pelvis Complete   Final Result         1. Fibroid uterus.   2. Probable right hydrosalpinx.   3. Left ovary not visualized.       Georgiana Spinner, MD   12/11/2021 12:35 PM      CT Lower Extremity Right W Contrast   Final Result      1.Subcutaneous infiltration/edema and skin thickening along the medial   proximal thigh consistent with soft tissue infection such as cellulitis.   No drainable fluid collections or soft tissue gas.      2.Incomplete characterization of a pelvic mass. Recommend further   evaluation with follow-up pelvic ultrasound.      These results /recommendations were discussed with and acknowledged by   Everette Rank,  MD on 12/11/2021 8:21 AM.      Emeline Darling,  MD   12/11/2021 8:22 AM      US Venous Duplex Doppler Leg Bilateral    (Results Pending)       Signed,  Manning Charity, MD  2:31 PM 12/12/2021

## 2021-12-12 NOTE — Plan of Care (Signed)
Pt is A&O x4. Lung sounds clear, on RA.   Pt c/o increased pain/burning in R upper inner thigh. Non-pitting edema noted, marked with pen. At 1416, pt reported pain 9/10 in R upper inner thigh. Prn morphine provided to pt with mild relief. Pt states that percocet is not as effective with pain in thigh. Getting lovenox for DVT prophylaxis. Pt reports no BM since Friday, started on stool softener today.     US doppler of BLE ordered to r/o DVT. MRSA cultures collected and sent to lab, results pending.     PIV 20G in R AC, site and dressing CDI. PIV 20G in L AC, site and dressing CDI. Getting Unasyn IVPB q6 hr.      Low fall risk per protocol. Call bell and bedside table within reach. Bed in lowest position and wheels locked. Pt is independent with ambulation.       Problem: Safety  Goal: Patient will be free from injury during hospitalization  Outcome: Progressing  Flowsheets (Taken 12/12/2021 1132)  Patient will be free from injury during hospitalization:   Assess patient's risk for falls and implement fall prevention plan of care per policy   Provide and maintain safe environment   Use appropriate transfer methods   Hourly rounding   Include patient/ family/ care giver in decisions related to safety   Ensure appropriate safety devices are available at the bedside   Assess for patients risk for elopement and implement Elopement Risk Plan per policy   Provide alternative method of communication if needed (communication boards, writing)  Goal: Patient will be free from infection during hospitalization  Outcome: Progressing  Flowsheets (Taken 12/12/2021 1132)  Free from Infection during hospitalization:   Assess and monitor for signs and symptoms of infection   Monitor lab/diagnostic results   Monitor all insertion sites (i.e. indwelling lines, tubes, urinary catheters, and drains)   Encourage patient and family to use good hand hygiene technique     Problem: Pain  Goal: Pain at adequate level as identified by  patient  Outcome: Progressing  Flowsheets (Taken 12/12/2021 1132)  Pain at adequate level as identified by patient:   Identify patient comfort function goal   Assess pain on admission, during daily assessment and/or before any "as needed" intervention(s)   Reassess pain within 30-60 minutes of any procedure/intervention, per Pain Assessment, Intervention, Reassessment (AIR) Cycle   Evaluate if patient comfort function goal is met   Evaluate patient's satisfaction with pain management progress   Include patient/patient care companion in decisions related to pain management as needed   Offer non-pharmacological pain management interventions     Problem: Side Effects from Pain Analgesia  Goal: Patient will experience minimal side effects of analgesic therapy  Outcome: Progressing  Flowsheets (Taken 12/12/2021 1132)  Patient will experience minimal side effects of analgesic therapy:   Monitor/assess patient's respiratory status (RR depth, effort, breath sounds)   Assess for changes in cognitive function   Prevent/manage side effects per LIP orders (i.e. nausea, vomiting, pruritus, constipation, urinary retention, etc.)   Evaluate for opioid-induced sedation with appropriate assessment tool (i.e. POSS)     Problem: Discharge Barriers  Goal: Patient will be discharged home or other facility with appropriate resources  Outcome: Progressing  Flowsheets (Taken 12/12/2021 1132)  Discharge to home or other facility with appropriate resources:   Provide appropriate patient education   Provide information on available health resources   Initiate discharge planning     Problem: Psychosocial and Spiritual Needs  Goal: Demonstrates ability to cope with hospitalization/illness  Outcome: Progressing  Flowsheets (Taken 12/12/2021 1132)  Demonstrates ability to cope with hospitalizations/illness:   Encourage verbalization of feelings/concerns/expectations   Provide quiet environment   Include patient/ patient care companion in decisions      Problem: Inadequate Cardiac Output  Goal: Adequate tissue perfusion will be maintained  Outcome: Progressing  Flowsheets (Taken 12/12/2021 1132)  Adequate tissue perfusion will be maintained:   Monitor/assess vital signs   Monitor/assess lab values and report abnormal values   Monitor/assess neurovascular status (pulses, capillary refill, pain, paresthesia, paralysis, presence of edema)   Monitor intake and output   Monitor/assess for signs of VTE (edema of calf/thigh redness, pain)   Monitor for signs and symptoms of a pulmonary embolism (dyspnea, tachypnea, tachycardia, confusion)   VTE Prevention: Administer anticoagulant(s) and/or apply anti-embolism stockings/devices as ordered   Increase mobility as tolerated/progressive mobility   Assess and monitor skin integrity     Problem: Infection  Goal: Free from infection  Outcome: Progressing  Flowsheets (Taken 12/12/2021 1132)  Free from infection: Assess for signs/symptoms of infection     Problem: Compromised skin integrity  Goal: Skin integrity is maintained or improved  Outcome: Progressing  Flowsheets (Taken 12/12/2021 1132)  Skin integrity is maintained or improved:   Assess Braden Scale every shift   Turn or reposition patient every 2 hours or as needed unless able to reposition self   Increase activity as tolerated/progressive mobility   Relieve pressure to bony prominences   Avoid shearing   Keep skin clean and dry     Problem: Nutrition  Goal: Nutritional intake is adequate  Outcome: Progressing  Flowsheets (Taken 12/12/2021 1132)  Nutritional intake is adequate:   Assist patient with meals/food selection   Allow adequate time for meals   Include patient/patient care companion in decisions related to nutrition   Assess anorexia, appetite, and amount of meal/food tolerated

## 2021-12-12 NOTE — UM Notes (Signed)
36 YEAR OLD FEMALE CAME TO [facility] WITH C/O R INNER UPPER THIGH SWELLING AND REDNESS THAT BEGAN ON 3/8. SEEN AT URGENT CARE THEN AND SENT HOME ON CLINDAMYCIN.  SEEN IN ER YESTERDAY FOR THE SAME. US SHOWED A POCKET OF FLUID CONSISTENT WITH ABSCESS/CELLULITIS. RETURNED TODAY WITH THE SAME. NOW ASSOC WITH CHILLS AND FEELING FEVERISH.     SIRS criteria was positive in the ER for Elevated RR and WBC. Lactic was negative and blood culture was send, a dose actinomycin was given in the ER.    VS- BP- 127/77, P- 89, R- 24, T- 98.5  LOP  10/10    LABS WBC 17.27, SED RATE 36,  CRP 16.7    US PELVIS-  1. Fibroid uterus.   2. Probable right hydrosalpinx.   3. Left ovary not visualized.     CT RLE-     1.Subcutaneous infiltration/edema and skin thickening along the medial   proximal thigh consistent with soft tissue infection such as cellulitis.   No drainable fluid collections or soft tissue gas.     2.Incomplete characterization of a pelvic mass. Recommend further   evaluation with follow-up pelvic ultrasound.         PLAN- ADMIT TO OBSERVATION CLASS 12/11/2021  1147    1. Sepsis due to Cellulitis; Not responding to po antibiotics; Right lower extremity CT finding: consistent with Soft Tissues soft tissue infection such as cellulitis. WBC 17.27;       SIRS present? Yes, SIRS criteria was positive in the ER for Elevated RR and WBC. Lactic was negative and blood culture was send, a dose vancomycin was given in the ER.  Started Unasyn 1.5 gm; will follow up after blood culture      2. Fibroid uterus: accidental finding of pelvic mass and follow [place] noted fibroid uterus. The pt states that she has chronic fibroids.      Will follow up as out patient with her OB.        3. Hypertension     Continue home med     4. Anxiety      Continue home med.     FEN:   Fluid: Good PO intake  Electrolytes: Noted  Nutrition:Cardiac      Adult Admit to Observation (Order #[id number]) on 12/11/21    Cellulitis: Observation Care - Observation Care Admission  Criteria    UTILIZATION REVIEW CONTACT: Name: [employer]  Utilization Review  [facility]  Address:  [address]  [address] [place], [place]  [address]  NPI:   [id number]  Tax ID:  [id number]  Phone: [phone] voice mail only  Fax: [phone]    Please use fax number [phone] to provide authorization for hospital services or to request additional information.        NOTES TO REVIEWER:     This clinical review is based on/compiled from documentation provided by the treatment team within the patient's medical record.

## 2021-12-13 DIAGNOSIS — R1900 Intra-abdominal and pelvic swelling, mass and lump, unspecified site: Secondary | ICD-10-CM

## 2021-12-13 LAB — BASIC METABOLIC PANEL
Anion Gap: 10 (ref 5.0–15.0)
BUN: 5 mg/dL — ABNORMAL LOW (ref 7.0–21.0)
CO2: 21 mEq/L (ref 17–29)
Calcium: 8.4 mg/dL — ABNORMAL LOW (ref 8.5–10.5)
Chloride: 107 mEq/L (ref 99–111)
Creatinine: 0.7 mg/dL (ref 0.4–1.0)
Glucose: 98 mg/dL (ref 70–100)
Potassium: 3.7 mEq/L (ref 3.5–5.3)
Sodium: 138 mEq/L (ref 135–145)

## 2021-12-13 LAB — CBC
Absolute NRBC: 0 10*3/uL (ref 0.00–0.00)
Hematocrit: 33.6 % — ABNORMAL LOW (ref 34.7–43.7)
Hgb: 10.8 g/dL — ABNORMAL LOW (ref 11.4–14.8)
MCH: 23.8 pg — ABNORMAL LOW (ref 25.1–33.5)
MCHC: 32.1 g/dL (ref 31.5–35.8)
MCV: 74 fL — ABNORMAL LOW (ref 78.0–96.0)
MPV: 10.3 fL (ref 8.9–12.5)
Nucleated RBC: 0 /100 WBC (ref 0.0–0.0)
Platelets: 318 10*3/uL (ref 142–346)
RBC: 4.54 10*6/uL (ref 3.90–5.10)
RDW: 15 % (ref 11–15)
WBC: 14.47 10*3/uL — ABNORMAL HIGH (ref 3.10–9.50)

## 2021-12-13 LAB — IRON PROFILE
Iron Saturation: 7 % — ABNORMAL LOW (ref 15–50)
Iron: 17 ug/dL — ABNORMAL LOW (ref 40–145)
TIBC: 251 ug/dL — ABNORMAL LOW (ref 265–497)
UIBC: 234 ug/dL (ref 126–382)

## 2021-12-13 LAB — SEDIMENTATION RATE: Sed Rate: 60 mm/Hr — ABNORMAL HIGH (ref 0–20)

## 2021-12-13 LAB — HEMOGLOBIN A1C
Average Estimated Glucose: 108.3 mg/dL
Hemoglobin A1C: 5.4 % (ref 4.6–5.9)

## 2021-12-13 LAB — GFR: EGFR: >60

## 2021-12-13 LAB — FERRITIN: Ferritin: 129.8 ng/mL (ref 4.60–204.00)

## 2021-12-13 LAB — C-REACTIVE PROTEIN: C-Reactive Protein: 22.9 mg/dL — ABNORMAL HIGH (ref 0.0–1.1)

## 2021-12-13 LAB — MRSA CULTURE
Culture MRSA Surveillance: NEGATIVE
Culture MRSA Surveillance: NEGATIVE

## 2021-12-13 LAB — HEMOLYSIS INDEX: Hemolysis Index: 48 Index — ABNORMAL HIGH (ref 0–24)

## 2021-12-13 MED ORDER — MORPHINE SULFATE 2 MG/ML IJ/IV SOLN (WRAP)
0.5000 mg | Freq: Once | Status: AC
Start: 2021-12-13 — End: 2021-12-13
  Administered 2021-12-13: 20:00:00 0.5 mg via INTRAVENOUS
  Filled 2021-12-13: qty 1

## 2021-12-13 MED ORDER — POLYSACCHARIDE IRON COMPLEX 150 MG PO CAPS
150.0000 mg | ORAL_CAPSULE | Freq: Every day | ORAL | Status: DC
Start: 2021-12-13 — End: 2021-12-15
  Administered 2021-12-13 – 2021-12-15 (×3): 150 mg via ORAL
  Filled 2021-12-13 (×3): qty 1

## 2021-12-13 MED ORDER — ASCORBIC ACID 500 MG PO TABS
500.0000 mg | ORAL_TABLET | Freq: Every day | ORAL | Status: DC
Start: 2021-12-13 — End: 2021-12-15
  Administered 2021-12-13 – 2021-12-15 (×3): 500 mg via ORAL
  Filled 2021-12-13 (×3): qty 1

## 2021-12-13 MED ORDER — LIDOCAINE-EPINEPHRINE 1 %-1:100000 IJ SOLN
10.0000 mL | Freq: Once | INTRAMUSCULAR | Status: AC
Start: 2021-12-13 — End: 2021-12-13
  Administered 2021-12-13: 15:00:00 10 mL via INTRADERMAL
  Filled 2021-12-13: qty 10

## 2021-12-13 MED ORDER — LORAZEPAM 2 MG/ML IJ SOLN
0.5000 mg | Freq: Once | INTRAMUSCULAR | Status: AC
Start: 2021-12-13 — End: 2021-12-13
  Administered 2021-12-13: 15:00:00 0.5 mg via INTRAVENOUS
  Filled 2021-12-13: qty 1

## 2021-12-13 MED ORDER — POLYSACCHARIDE IRON COMPLEX 150 MG PO CAPS
ORAL_TABLET | Freq: Every day | ORAL | Status: DC
Start: 2021-12-13 — End: 2021-12-13
  Filled 2021-12-13: qty 1

## 2021-12-13 MED ORDER — POLYETHYLENE GLYCOL 3350 17 G PO PACK
17.0000 g | PACK | Freq: Every day | ORAL | Status: DC
Start: 2021-12-13 — End: 2021-12-15
  Administered 2021-12-13 – 2021-12-15 (×3): 17 g via ORAL
  Filled 2021-12-13 (×3): qty 1

## 2021-12-13 NOTE — UM Notes (Signed)
** This review is compiled from documentation provided by the treatment team within the patient's medical record. **     Linda Eaton, RN, BSN, ACM  Clinical Case Manager - Utilization Review    [facility]   [address]  [address]  [place], [place], [address]    NPI: [id number]  Tax ID: [id number]  Fax Number: [phone]  Insurance Line [place]: [phone]  Confidential VM: [phone]  [email or web address]      Please use fax number or insurance line to provide authorization for hospital services or to request additional information.       [facility]   (IAH) [facility]   (IFH) [facility]   [facility]) [facility]   [facility]) [facility]  [facility])   [address]  [place], [place] [address] [address] [place], [place] [address] [address]  [place], [place] [address] [phone] [facility]  [place], [place] [address] [address]  [place], [place] [address]   NPI: [id number]  Tax ID: [id number] NPI: [id number]  Tax ID: [id number] NPI: [id number]  Tax ID: [id number] NPI: [id number]  Tax ID: [id number] NPI: [id number]  Tax ID: [id number]         Clinical Review 3/13    Charmayne Odell  1986/08/01        Upgraded to INPT     12/13/21 1554  Admit to Inpatient  Once        Diagnosis: Cellulitis, Unspecified Cellulitis Site    Level of Care: Acute    Patient Class: Inpatient       References:    [facility] Bed Placement Criteria    [facility] Bed Placement Criteria    [facility] Bed Placement Criteria    [facility] Bed Placement Criteria    [facility] Bed Placement Criteria    New Service Definitions Feb 2023   Question Answer Comment   Admitting Physician Gailen Shelter R    Service: Medicine    Estimated Length of Stay > or = to 2 midnights    Tentative Discharge Plan? Home or Self Care    Does patient need telemetry? No        12/13/21 1554         Consult - ID:         Reason for Consultation:   Right thigh abscess      36 year old female  with history of hypertension and obesity.  She presents with a 5-day history of increased pain and swelling in the right upper medial thigh.  States that she had boils on many occasions in her axilla.  She has had incision and drainage in the past    Knot on the right thigh 5 days ago.  This is gotten progressively larger and more tender.  She had gone to urgent care where she was given clindamycin.  It was not incised.    She presented to the emergency department and was told to continue on the clindamycin.    She now presents back with continued fevers and increased pain and swelling of the right inner thigh.      Right upper inner thigh near the groin is an area of induration.  There is central softness.  It is quite tender.  There is very mild surrounding cellulitis.      Temp to 101.2 on 3/12      Assessment:   Right thigh/groin abscess.  She has central fluctuance.  Mild leukocytosis.  CT scan shows cellulitic change but no obvious abscess.  Clinically, she has an abscess with central softness.     Plan:      Patient need incision and drainage of the abscess.  We will send a culture.  Most likely this is all related to normal skin flora.  She has no allergies.  Currently on Unasyn which should be adequate.  We have general surgery to see the patient.        Consult - Surgery:    Reason for Consultation:   Cellulitis & Abscess    36 yo F     - cellulitis and abscess formation of right medial upper thigh  - CT right LE 12/11/21: subcutaneous infiltration/edema & skin thickening along medial proximal thigh c/w soft tissue infection, no drainable fluid collection or soft tissue gas     Afebrile today. AVSS. Leukocytosis mildly improved. Consult requested for possible development of abscess within cellulitis.  Plan:   - Plan for I&D of abscess today. Consent obtained. Will obtain culture.  - continue abx per ID  [facility] consult requested  - plan per primary        Per Attending:    Sepsis present in ED due to  cellulitis right upper inner thigh:  - failed outpatient treatment  - imaging noted  - dopplers neg for DVT  - leukocytosis noted. Trending  - on unasyn. S/p vanc  - blood cultures  negative so far  - MRSA screen neg  - Crp and esr elevated. Trending up  - ID and VSA consulted. Case discussed. Planning on I&D today  Fibroid uterus: noted on imaging. Follow-up outpatient  HTN: home rx continued  Anxiety: home rx continued  Microcytic anemia: ferritin and iron panel noted. Oral supplementation added  Constipation: colace added 12/12/21. Miralax added 12/13/21     Analgesia: tylenol, percocet, advil, morphine            C Reactive Protein [[id number]]  (Abnormal) Collected: 12/13/21 0521      Specimen: Blood Updated: 12/13/21 0638       C-Reactive Protein 22.9 mg/dL       Narrative:       This is NOT the correct Test for Patients with  Hemoglobinopathy.     Sedimentation rate (ESR) [[id number]]  (Abnormal) Collected: 12/13/21 0521     Specimen: Blood Updated: 12/13/21 0608       Sed Rate 60 mm/Hr           Recent Labs     12/13/21  0521   WBC 14.47*   RBC 4.54   Hgb 10.8*   Hematocrit 33.6*   MCV 74.0*   Platelets 318         Recent CMP       Recent Labs     12/13/21  0521   Glucose 98   BUN 5.0*   Creatinine 0.7   Sodium 138   Potassium 3.7   CO2 21         Current Facility-Administered Medications   Medication Dose Route Frequency    ampicillin-sulbactam  1.5 g Intravenous Q6H    docusate sodium  100 mg Oral BID    DULoxetine  20 mg Oral Daily    enoxaparin  40 mg Subcutaneous Daily    NIFEdipine XL  30 mg Oral Daily        LORazepam (ATIVAN) injection 0.5 mg  Dose: 0.5 mg  Freq: Once Route: IV  Start: 12/13/21 1515 End: 12/13/21 1519    morphine injection 0.5 mg  Dose: 0.5 mg  Freq: Once Route: IV  Start: 12/13/21 2030 End: 12/13/21 2005      PRNs:  ibuprofen (ADVIL) tablet 400 mg  Dose: 400 mg  Freq: Every 6 hours PRN Route: PO  Given x1    morphine injection 1 mg  Dose: 1 mg  Freq: Every 4 hours PRN Route: IV  Given  x4    oxyCODONE-acetaminophen (PERCOCET) 5-325 MG per tablet 1 tablet  Dose: 1 tablet  Freq: Every 6 hours PRN Route: PO  Given x3        Temperature: Temp  Min: 98.1 F (36.7 C)  Max: 99.5 F (37.5 C)  Pulse: Pulse  Min: 84  Max: 97  Respiratory: Resp  Min: 16  Max: 18  Non-Invasive BP: BP  Min: 105/71  Max: 135/82  Pulse Oximetry SpO2  Min: 98 %  Max: 100 %

## 2021-12-13 NOTE — Consults (Signed)
@[email or web address] @   CONSULTATION REPORT  [facility] [phone]    Consultation Date Time: 12/13/21 3:16 PM  Date of Admission: 12/11/2021     Requesting Physician: Manning Charity, MD  Consulting Physician: Rich Brave, MD  Consulting Service: General Surgery      Reason for Consultation:   Cellulitis & Abscess    Impression:     Patient Active Problem List   Diagnosis    Cellulitis, unspecified cellulitis site     36 yo F    - cellulitis and abscess formation of right medial upper thigh  - CT right LE 12/11/21: subcutaneous infiltration/edema & skin thickening along medial proximal thigh c/w soft tissue infection, no drainable fluid collection or soft tissue gas    Afebrile today. AVSS. Leukocytosis mildly improved. Consult requested for possible development of abscess within cellulitis.  Plan:   - Plan for I&D of abscess today. Consent obtained. Will obtain culture.  - continue abx per ID  [employer] consult requested  - plan per primary    *Patient seen with Dr. Tasia Catchings    History of Present Illness:   Linda [name] is a 36 y.o. female who  has a past medical history of Anxiety, Depression, and Endometriosis..      She presented to the hospital with complaints of painful lesion that has been getting worse.  Symptoms began a week ago.  It is located in the upper, medial plane of the right thigh. Associated symptoms: erythema, edema, induration, fever, and chills. The patient denies drainage, nausea, vomiting, and diarrhea.  The patient admits to the following precipitating factors:  none known specifically, reports thighs rub together with ambulation .  Previous treatments include: antibiotics (was on PO clinda as OP with worsening of erythema) currently on IV unasyn.   Symptoms have been unchanged since.      Past Medical History:     Past Medical History:   Diagnosis Date    Anxiety     Depression     Endometriosis        Past Surgical History:     Past Surgical History:   Procedure Laterality Date    LAPAROTOMY,  SALPINGO-OOPHERECTOMY Left        Family History:   No family history on file.    Social History:     Social History     Socioeconomic History    Marital status: Single     Spouse name: Not on file    Number of children: Not on file    Years of education: Not on file    Highest education level: Not on file   Occupational History    Not on file   Tobacco Use    Smoking status: Never     Passive exposure: Never    Smokeless tobacco: Never   Vaping Use    Vaping Use: Never used   Substance and Sexual Activity    Alcohol use: Yes    Drug use: Never    Sexual activity: Not on file   Other Topics Concern    Not on file   Social History Narrative    Not on file     Social Determinants of Health     Financial Resource Strain: Not on file   Food Insecurity: Not on file   Transportation Needs: Not on file   Physical Activity: Not on file   Stress: Not on file   Social Connections: Not on file   Intimate Partner Violence: Not  on file   Housing Stability: Not on file       Allergies:   No Known Allergies    Medications:     Prior to Admission medications    Medication Sig Start Date End Date Taking? Authorizing Provider   clindamycin (CLEOCIN) 300 MG capsule  12/09/21  Yes [provider]   DULoxetine (CYMBALTA) 20 MG capsule  11/19/21  Yes [provider]   NIFEdipine XL (PROCARDIA XL) 30 MG 24 hr tablet Take 30 mg by mouth daily 11/12/21  Yes [provider]   oxyCODONE (ROXICODONE) 5 MG immediate release tablet Take 1 tablet (5 mg) by mouth every 6 (six) hours as needed for Pain 12/10/21  Yes Connealy, Margeaux B, PA       Review of Systems:   As per the HPI.  The patient denies any additional changes to their otic, opthalmologic, dermatologic, pulmonary, cardiac, gastrointestinal, genitourinary, musculoskeletal, hematologic, constitutional, or psychiatric systems.    Physical Exam:     Vitals:    12/13/21 1149   BP: 135/82   Pulse: 90   Resp: 18   Temp: 98.1 F (36.7 C)   SpO2: 100%       General  appearance - alert, well appearing, and in no distress  Mental status - alert, oriented to person, place, and time  Eyes - sclera anicteric, left eye normal, right eye normal  Chest - no tachypnea, retractions or cyanosis  Heart - normal rate and regular rhythm  Extremities - no pedal edema noted, right medial thigh with erythema, induration and central fluctuance  Skin - normal coloration and turgor, no rashes, no suspicious skin lesions noted    Physical Examination: General appearance - oriented to person, place, and time and overweight  Mental status - alert, oriented to person, place, and time  Eyes - sclera anicteric  Chest - no tachypnea, retractions or cyanosis  Heart - normal rate and regular rhythm  Abdomen - no rebound tenderness noted  Neurological - screening mental status exam normal  Musculoskeletal - no muscular tenderness noted  Extremities - no pedal edema noted   Labs:     Results       Procedure Component Value Units Date/Time    Culture Blood Aerobic and Anaerobic [[id number]] Collected: 12/11/21 0600    Specimen: Arm from Blood, Venipuncture Updated: 12/13/21 1421    Narrative:      ORDER#: [id number]                                    ORDERED BY: Darcel Bayley, LISA  SOURCE: Blood, Venipuncture arm                      COLLECTED:  12/11/21 06:00  ANTIBIOTICS AT COLL.:                                RECEIVED :  12/11/21 13:47  Culture Blood Aerobic and Anaerobic        PRELIM      12/13/21 14:21  12/12/21   No Growth after 1 day/s of incubation.  12/13/21   No Growth after 2 day/s of incubation.      Culture Blood Aerobic and Anaerobic [[id number]] Collected: 12/11/21 0600    Specimen: Arm from Blood, Venipuncture Updated: 12/13/21 1421    Narrative:  ORDER#: [id number]                                    ORDERED BY: Darcel Bayley, LISA  SOURCE: Blood, Venipuncture arm                      COLLECTED:  12/11/21 06:00  ANTIBIOTICS AT COLL.:                                RECEIVED :  12/11/21 13:47  Culture  Blood Aerobic and Anaerobic        PRELIM      12/13/21 14:21  12/12/21   No Growth after 1 day/s of incubation.  12/13/21   No Growth after 2 day/s of incubation.      MRSA culture [[id number]] Collected: 12/12/21 1049    Specimen: Culturette from Nasal Swab Updated: 12/13/21 1407     Culture MRSA Surveillance Negative for Methicillin Resistant Staph aureus    MRSA culture [[id number]] Collected: 12/12/21 1049    Specimen: Culturette from Throat Updated: 12/13/21 1407     Culture MRSA Surveillance Negative for Methicillin Resistant Staph aureus    Ferritin [[id number]] Collected: 12/13/21 0521    Specimen: Blood Updated: 12/13/21 1029     Ferritin 129.80 ng/mL     Narrative:      This is NOT the correct Test for Patients with  Hemoglobinopathy.    IRON PROFILE [[id number]]  (Abnormal) Collected: 12/13/21 0521     Updated: 12/13/21 1020     Iron 17 ug/dL      UIBC 244 ug/dL      TIBC 010 ug/dL      Iron Saturation 7 %     Narrative:      This is NOT the correct Test for Patients with  Hemoglobinopathy.    Hemolysis index [[id number]]  (Abnormal) Collected: 12/13/21 0521     Updated: 12/13/21 1020     Hemolysis Index 48 Index     Narrative:      This is NOT the correct Test for Patients with  Hemoglobinopathy.    Hemoglobin A1C [[id number]] Collected: 12/13/21 0521    Specimen: Blood Updated: 12/13/21 0958     Hemoglobin A1C 5.4 %      Average Estimated Glucose 108.3 mg/dL     Narrative:      This is NOT the correct Test for Patients with  Hemoglobinopathy.    GFR [[id number]] Collected: 12/13/21 0521     Updated: 12/13/21 2951     EGFR >60.0    Narrative:      This is NOT the correct Test for Patients with  Hemoglobinopathy.    Basic Metabolic Panel [[id number]]  (Abnormal) Collected: 12/13/21 0521    Specimen: Blood Updated: 12/13/21 0638     Glucose 98 mg/dL      BUN 5.0 mg/dL      Creatinine 0.7 mg/dL      Calcium 8.4 mg/dL      Sodium 016 mEq/L      Potassium 3.7 mEq/L      Chloride 107 mEq/L      CO2 21 mEq/L       Anion Gap 10.0    Narrative:      This is NOT the correct Test for Patients with  Hemoglobinopathy.    C Reactive Protein [[id number]]  (  Abnormal) Collected: 12/13/21 0521    Specimen: Blood Updated: 12/13/21 1610     C-Reactive Protein 22.9 mg/dL     Narrative:      This is NOT the correct Test for Patients with  Hemoglobinopathy.    Sedimentation rate (ESR) [[id number]]  (Abnormal) Collected: 12/13/21 0521    Specimen: Blood Updated: 12/13/21 0608     Sed Rate 60 mm/Hr     Narrative:      This is NOT the correct Test for Patients with  Hemoglobinopathy.    CBC without differential [[id number]]  (Abnormal) Collected: 12/13/21 0521    Specimen: Blood Updated: 12/13/21 0534     WBC 14.47 x10 3/uL      Hgb 10.8 g/dL      Hematocrit 56.2 %      Platelets 318 x10 3/uL      RBC 4.54 x10 6/uL      MCV 74.0 fL      MCH 23.8 pg      MCHC 32.1 g/dL      RDW 15 %      MPV 10.3 fL      Nucleated RBC 0.0 /100 WBC      Absolute NRBC 0.00 x10 3/uL     Narrative:      This is NOT the correct Test for Patients with  Hemoglobinopathy.            Rads:     Radiology Results (24 Hour)       Procedure Component Value Units Date/Time    US Venous Duplex Doppler Leg Bilateral [[id number]] Collected: 12/12/21 1600    Order Status: Completed Updated: 12/12/21 1603    Narrative:      HISTORY: Leg pain and swelling.    COMPARISON: None.    TECHNIQUE: Wallace Cullens scale, color flow, and spectral Doppler waveform  analysis was performed on the bilateral lower extremity veins described  below. There is normal compressibility, phasic flow, and response to  augmentation unless otherwise noted.    FINDINGS:   Right lower extremity:    Common femoral vein: Normal  Deep femoral vein (proximal portion): Normal  Greater saphenous vein at the saphenofemoral junction: Normal  Femoral vein: Normal  Popliteal vein: Normal  Posterior tibial veins: Normal  Peroneal veins: Normal    Left lower extremity:    Common femoral vein: Normal  Deep femoral vein (proximal  portion): Normal  Greater saphenous vein at the saphenofemoral junction: Normal  Femoral vein: Normal  Popliteal vein: Normal  Posterior tibial veins: Normal  Peroneal veins: Normal      Impression:         No sonographic evidence for right or left lower extremity deep venous  thrombosis.    Georgiana Spinner, MD  12/12/2021 4:01 PM              Signed by: Ruel Favors, PA      I performed the substantive portion of this visit by personally conducting the history, physical exam and medical decision making in its' entirety.  I reviewed and updated the documented findings and plan of care accordingly.

## 2021-12-13 NOTE — Progress Notes (Addendum)
[facility]  [facility]ESS NOTE      Patient: Linda [name]  Date: 12/13/2021   LOS: 0 Days  Admission Date: 12/11/2021   MRN: [id number]  Attending:  Manning CharityGaige, Charell Faulk R, MD     ASSESSMENT/PLAN     Linda [name] is a 36 y.o. female admitted with ...      Sepsis present in ED due to cellulitis right upper inner thigh:  - failed outpatient treatment  - imaging noted  - dopplers neg for DVT  - leukocytosis noted. Trending  - on unasyn. S/p vanc  - blood cultures  negative so far  - MRSA screen neg  - Crp and esr elevated. Trending up  - ID and [facility] consulted. Case discussed. Planning on I&D today  Fibroid uterus: noted on imaging. Follow-up outpatient  HTN: home rx continued  Anxiety: home rx continued  Microcytic anemia: ferritin and iron panel noted. Oral supplementation added  Constipation: colace added 12/12/21. Miralax added 12/13/21    Analgesia: tylenol, percocet, advil, morphine    FEN  Fluids: not needed  Electrolytes: noted  Nutrition: Cardiac Diet  Body mass index is 40.92 kg/m. Obesity class III: noted        Monitoring:  Telemetry needed: no  Continuous pulse oximetry needed: no  Level of Care: 4      Safety Checklist  DVT prophylaxis: Chemical   Foley: Not present   IVs:  Peripheral IV   PT/OT: Not needed   Daily CBC & or Chem ordered: Yes, due to clinical and lab instability       Patient Lines/Drains/Airways Status       Active PICC Line / CVC Line / PIV Line / Drain / Airway / Intraosseous Line / Epidural Line / ART Line / Line / Wound / Pressure Ulcer / NG/OG Tube       Name Placement date Placement time Site Days    Peripheral IV 12/11/21 20 G Right Antecubital 12/11/21  0538  Antecubital  1    Peripheral IV 12/11/21 20 G Left Antecubital 12/11/21  --  Antecubital  1                    MD/RN rounds: yes       Code Status: full code    DISPO: home with family    Family Contact: sister    Care Plan discussed with nursing, consultants, case Production designer, theatre/television/filmmanager.       SUBJECTIVE     Linda [name]  states she is having less burning at site, but still painful. No BM yet.     MEDICATIONS     Current Facility-Administered Medications   Medication Dose Route Frequency    ampicillin-sulbactam  1.5 g Intravenous Q6H    docusate sodium  100 mg Oral BID    DULoxetine  20 mg Oral Daily    enoxaparin  40 mg Subcutaneous Daily    NIFEdipine XL  30 mg Oral Daily       ROS     All other systems not mentioned in subjective reviewed and negative    PHYSICAL EXAM     Vitals:    12/13/21 1559   BP: 105/71   Pulse: 87   Resp: 16   Temp: 98.4 F (36.9 C)   SpO2: 100%       Temperature: Temp  Min: 98.1 F (36.7 C)  Max: 99.5 F (37.5 C)  Pulse: Pulse  Min: 84  Max:  97  Respiratory: Resp  Min: 16  Max: 18  Non-Invasive BP: BP  Min: 105/71  Max: 135/82  Pulse Oximetry SpO2  Min: 98 %  Max: 100 %    Intake and Output Summary (Last 24 hours) at Date Time  No intake or output data in the 24 hours ending 12/13/21 1643    GEN APPEARANCE: No acute distress  HEENT: Conjunctiva Clear  NECK: no masses  CVS: RRR, S1, S2; No Murmur  LUNGS: CTAB  ABD: Soft; No TTP; + Normoactive BS  EXT: No edema  SKIN: localized area of induration and tenderness right upper inner thigh distal to groin crease. Coalescing to central area of fluctuance   NEURO:  No Focal neurological deficits      LABS     Recent Labs   Lab 12/13/21  0521 12/12/21  0551 12/11/21  0541   WBC 14.47* 15.55* 17.27*   RBC 4.54 4.62 4.93   Hgb 10.8* 11.1* 11.8   Hematocrit 33.6* 35.2 36.1   MCV 74.0* 76.2* 73.2*   Platelets 318 265 283       Recent Labs   Lab 12/13/21  0521 12/11/21  0541 12/10/21  0821   Sodium 138 136 136   Potassium 3.7 3.6 3.9   Chloride 107 106 105   CO2 21 22 23    BUN 5.0* 8.0 10.0   Creatinine 0.7 0.8 0.8   Glucose 98 106* 101*   Calcium 8.4* 8.6 9.0       Recent Labs   Lab 12/11/21  0541 12/10/21  0821   ALT 22 16   AST (SGOT) 23 20   Bilirubin, Total 0.7 0.5   Albumin 3.3* 3.5   Alkaline Phosphatase 69 70                   Microbiology Results (last 15  days)       Procedure Component Value Units Date/Time    Culture + Gram 02/09/22 Wound Azzie Glatter Collected: 12/13/21 1616    Order Status: Sent Specimen: Wound from Cellulitis Updated: 12/13/21 1616    MRSA culture 12/15/21 Collected: 12/12/21 1049    Order Status: Completed Specimen: Culturette from Nasal Swab Updated: 12/13/21 1407     Culture MRSA Surveillance Negative for Methicillin Resistant Staph aureus    MRSA culture 12/15/21 Collected: 12/12/21 1049    Order Status: Completed Specimen: Culturette from Throat Updated: 12/13/21 1407     Culture MRSA Surveillance Negative for Methicillin Resistant Staph aureus    COVID-19 (SARS-CoV-2) only ([facility]) asymptomatic admission 12/15/21 Collected: 12/11/21 0744    Order Status: Completed Specimen: Nasopharyngeal Updated: 12/11/21 0811     Purpose of COVID testing Screening     SARS-CoV-2 Specimen Source Nasal Swab     SARS CoV 2 Overall Result Not Detected     Comment: __________________________________________________  -A result of "Detected" indicates POSITIVE for the    presence of SARS CoV-2 RNA  -A result of "Not Detected" indicates NEGATIVE for the    presence of SARS CoV-2 RNA  __________________________________________________________  Test performed using the Roche cobas Liat SARS-CoV-2 assay. This assay is  only for use under the Food and Drug Administration's Emergency Use  Authorization. This is a real-time RT-PCR assay for the qualitative  detection of SARS-CoV-2 RNA. Viral nucleic acids may persist in vivo,  independent of viability. Detection of viral nucleic acid does not imply the  presence of infectious virus, or that virus nucleic acid is the  cause of  clinical symptoms. Negative results do not preclude SARS-CoV-2 infection and  should not be used as the sole basis for diagnosis, treatment or other  patient management decisions. Negative results must be combined with  clinical observations, patient history, and/or  epidemiological information.  Invalid results may be due to inhibiting substances in the specimen and  recollection should occur. Please see Fact Sheets for patients and providers  located:  WirelessDSLBlog.no         Narrative:      o Collect and clearly label specimen type:  o PREFERRED-Upper respiratory specimen: One Nasal Swab in  Transport Media.  o Hand deliver to laboratory ASAP  Indication for testing->Extended care facility admission to  semi private room  Screening    Culture Blood Aerobic and Anaerobic [[id number]] Collected: 12/11/21 0600    Order Status: Completed Specimen: Arm from Blood, Venipuncture Updated: 12/13/21 1421    Narrative:      ORDER#: [id number]                                    ORDERED BY: Darcel Bayley, LISA  SOURCE: Blood, Venipuncture arm                      COLLECTED:  12/11/21 06:00  ANTIBIOTICS AT COLL.:                                RECEIVED :  12/11/21 13:47  Culture Blood Aerobic and Anaerobic        PRELIM      12/13/21 14:21  12/12/21   No Growth after 1 day/s of incubation.  12/13/21   No Growth after 2 day/s of incubation.      Culture Blood Aerobic and Anaerobic [[id number]] Collected: 12/11/21 0600    Order Status: Completed Specimen: Arm from Blood, Venipuncture Updated: 12/13/21 1421    Narrative:      ORDER#: [id number]                                    ORDERED BY: Darcel Bayley, LISA  SOURCE: Blood, Venipuncture arm                      COLLECTED:  12/11/21 06:00  ANTIBIOTICS AT COLL.:                                RECEIVED :  12/11/21 13:47  Culture Blood Aerobic and Anaerobic        PRELIM      12/13/21 14:21  12/12/21   No Growth after 1 day/s of incubation.  12/13/21   No Growth after 2 day/s of incubation.               RADIOLOGY     Radiological Procedure xray personally reviewed and concur with radiologist reports unless stated otherwise.    US Venous Duplex Doppler Leg Bilateral   Final Result       No sonographic evidence for right or left  lower extremity deep venous   thrombosis.      Georgiana Spinner, MD   12/12/2021 4:01 PM      US Pelvis Complete   Final Result  1. Fibroid uterus.   2. Probable right hydrosalpinx.   3. Left ovary not visualized.       Georgiana Spinner, MD   12/11/2021 12:35 PM      CT Lower Extremity Right W Contrast   Final Result      1.Subcutaneous infiltration/edema and skin thickening along the medial   proximal thigh consistent with soft tissue infection such as cellulitis.   No drainable fluid collections or soft tissue gas.      2.Incomplete characterization of a pelvic mass. Recommend further   evaluation with follow-up pelvic ultrasound.      These results /recommendations were discussed with and acknowledged by   Everette Rank, MD on 12/11/2021 8:21 AM.      Emeline Darling, MD   12/11/2021 8:22 AM          Signed,  Manning Charity, MD  4:43 PM 12/13/2021

## 2021-12-13 NOTE — Progress Notes (Signed)
Case Management Assessment    Introduced CM Services and self to patient   Discussed plan of care and possible discharge needs   Patient has cognitive ability to participate in care decisions and follow-up care as needed  Verified  demographics and PCP on facesheet: No PCP, new to the area  Assessed for lack of insurance or underinsured.  Patient is NOT identified as a Medicare focused patient or high risk for failed discharge plan or readmission as detailed in the High Risk Patient Screening policy     Plan of care: s/p I&D, wound consult pending, cultures pending    Discharge Plan A: Home with outpt followup-- [facility]  Discharge Plan B: Home with [facility] for wound care    Expected discharge date: 12/14/21    Barriers to discharge: none  CM # on white board:    Comments: Met with Linda [name] and her friend. She is independent in ADLs, IADLs. Confirmed emergency contacts. She is interested in linkage to PCP upon [place].     Joseph Berkshire, LCSW  [phone]

## 2021-12-13 NOTE — Plan of Care (Incomplete)
Problem: Safety  Goal: Patient will be free from injury during hospitalization  Outcome: Progressing  Goal: Patient will be free from infection during hospitalization  Outcome: Progressing     Problem: Pain  Goal: Pain at adequate level as identified by patient  Outcome: Progressing     Problem: Side Effects from Pain Analgesia  Goal: Patient will experience minimal side effects of analgesic therapy  Outcome: Progressing     Problem: Discharge Barriers  Goal: Patient will be discharged home or other facility with appropriate resources  Outcome: Progressing     Problem: Inadequate Cardiac Output  Goal: Adequate tissue perfusion will be maintained  Outcome: Progressing     Problem: Infection  Goal: Free from infection  Outcome: Progressing     Problem: Compromised skin integrity  Goal: Skin integrity is maintained or improved  Outcome: Progressing

## 2021-12-13 NOTE — Progress Notes (Signed)
Quick Surgical Note:    Pt is currently sleeping. Nurse had to give an extra pain medication earlier.  No other issues/concerns at this time.     Osie Cheeks FNP  [facility]

## 2021-12-13 NOTE — Plan of Care (Addendum)
A/Ox4. RA. Independent. Pt is managed with percocet and morphine IV. Surgery team did I&D at the bedside and lidocaine with epi was used. Ativan 0.5mg  given prior to procedure. Wound culture sent. Pt reported constipation and colace given along with prune juice. MRSA negative. Will continue to monitor    1734: dressing changed due to soil. Only gauze changed. Packing still intact.    Problem: Safety  Goal: Patient will be free from injury during hospitalization  Outcome: Progressing  Flowsheets (Taken 12/12/2021 1132 by Jolinda Croak, RN)  Patient will be free from injury during hospitalization:   Assess patient's risk for falls and implement fall prevention plan of care per policy   Provide and maintain safe environment   Use appropriate transfer methods   Hourly rounding   Include patient/ family/ care giver in decisions related to safety   Ensure appropriate safety devices are available at the bedside   Assess for patients risk for elopement and implement Elopement Risk Plan per policy   Provide alternative method of communication if needed (communication boards, writing)  Goal: Patient will be free from infection during hospitalization  Outcome: Progressing  Flowsheets (Taken 12/12/2021 1132 by Jolinda Croak, RN)  Free from Infection during hospitalization:   Assess and monitor for signs and symptoms of infection   Monitor lab/diagnostic results   Monitor all insertion sites (i.e. indwelling lines, tubes, urinary catheters, and drains)   Encourage patient and family to use good hand hygiene technique     Problem: Pain  Goal: Pain at adequate level as identified by patient  Outcome: Progressing  Flowsheets (Taken 12/12/2021 1132 by Jolinda Croak, RN)  Pain at adequate level as identified by patient:   Identify patient comfort function goal   Assess pain on admission, during daily assessment and/or before any "as needed" intervention(s)   Reassess pain within 30-60 minutes of any procedure/intervention, per  Pain Assessment, Intervention, Reassessment (AIR) Cycle   Evaluate if patient comfort function goal is met   Evaluate patient's satisfaction with pain management progress   Include patient/patient care companion in decisions related to pain management as needed   Offer non-pharmacological pain management interventions     Problem: Side Effects from Pain Analgesia  Goal: Patient will experience minimal side effects of analgesic therapy  Outcome: Progressing  Flowsheets (Taken 12/12/2021 1132 by Jolinda Croak, RN)  Patient will experience minimal side effects of analgesic therapy:   Monitor/assess patient's respiratory status (RR depth, effort, breath sounds)   Assess for changes in cognitive function   Prevent/manage side effects per LIP orders (i.e. nausea, vomiting, pruritus, constipation, urinary retention, etc.)   Evaluate for opioid-induced sedation with appropriate assessment tool (i.e. POSS)     Problem: Infection  Goal: Free from infection  Outcome: Progressing  Flowsheets (Taken 12/13/2021 1420)  Free from infection:   Assess for signs/symptoms of infection   Utilize isolation precautions per protocol/policy   Consult/collaborate with Infection Preventionist   Utilize sepsis protocol

## 2021-12-13 NOTE — Consults (Signed)
ID CONSULTATION NOTE    Date Time: 12/13/21 11:03 AM  Patient Name: Linda [name]  Requesting Physician: Manning Charity, MD          Reason for Consultation:   Right thigh abscess    History:   36 year old female with history of hypertension and obesity.  She presents with a 5-day history of increased pain and swelling in the right upper medial thigh.  States that she had boils on many occasions in her axilla.  She has had incision and drainage in the past.  Has no history of any resistant bacteria.    He started helping a knot on the right thigh 5 days ago.  This is gotten progressively larger and more tender.  She had gone to urgent care where she was given clindamycin.  It was not incised.  She presented to the emergency department and was told to continue on the clindamycin.  She now presents back with continued fevers and increased pain and swelling of the right inner thigh.    She has had some fevers.  No headache  No shortness of breath or cough  No nausea or vomiting, no diarrhea  She is having some lower abdominal cramping.  She is currently on her menses.  She has pain in her right upper medial thigh.  No rash or itching    Past Medical History:     Past Medical History:   Diagnosis Date    Anxiety     Depression     Endometriosis        Past Surgical History:     Past Surgical History:   Procedure Laterality Date    LAPAROTOMY, SALPINGO-OOPHERECTOMY Left        Family History:   No family history on file.    Social History:     Social History     Socioeconomic History    Marital status: Single     Spouse name: Not on file    Number of children: Not on file    Years of education: Not on file    Highest education level: Not on file   Occupational History    Not on file   Tobacco Use    Smoking status: Never     Passive exposure: Never    Smokeless tobacco: Never   Vaping Use    Vaping Use: Never used   Substance and Sexual Activity    Alcohol use: Yes    Drug use: Never    Sexual activity: Not on  file   Other Topics Concern    Not on file   Social History Narrative    Not on file     Social Determinants of Health     Financial Resource Strain: Not on file   Food Insecurity: Not on file   Transportation Needs: Not on file   Physical Activity: Not on file   Stress: Not on file   Social Connections: Not on file   Intimate Partner Violence: Not on file   Housing Stability: Not on file       Allergies:   No Known Allergies    Medications:     Current Facility-Administered Medications   Medication Dose Route Frequency    ampicillin-sulbactam  1.5 g Intravenous Q6H    docusate sodium  100 mg Oral BID    DULoxetine  20 mg Oral Daily    enoxaparin  40 mg Subcutaneous Daily    NIFEdipine XL  30 mg Oral  Daily       Review of Systems:       She has had some fevers.  No headache  No shortness of breath or cough  No nausea or vomiting, no diarrhea  She is having some lower abdominal cramping.  She is currently on her menses.  She has pain in her right upper medial thigh.  No rash or itching  Physical Exam:     Vitals:    12/13/21 0814   BP: 113/77   Pulse: 88   Resp: 18   Temp: 99.5 F (37.5 C)   SpO2: 99%     Obese black female in no acute distress.  No central line in place    Head and neck examinations unremarkable  Lungs clear  Heart regular rate and rhythm  Abdomen is soft.  She has mild lower quadrant tenderness.  Right upper inner thigh near the groin is an area of induration.  There is central softness.  It is quite tender.  There is very mild surrounding cellulitis.        Labs Reviewed:      Recent CBC WITH DIFF   Recent Labs     12/13/21  0521   WBC 14.47*   RBC 4.54   Hgb 10.8*   Hematocrit 33.6*   MCV 74.0*   Platelets 318       Recent CMP   Recent Labs     12/13/21  0521   Glucose 98   BUN 5.0*   Creatinine 0.7   Sodium 138   Potassium 3.7   CO2 21       Recent Labs   Lab 12/13/21  0521 12/12/21  0551 12/11/21  0541   WBC 14.47* 15.55* 17.27*   RBC 4.54 4.62 4.93   Hgb 10.8* 11.1* 11.8   Hematocrit 33.6*  35.2 36.1   MCV 74.0* 76.2* 73.2*   MCHC 32.1 31.5 32.7   RDW 15 15 15    MPV 10.3 10.8 10.0   Platelets 318 265 283       Recent Labs   Lab 12/13/21  0521 12/11/21  0541 12/10/21  0821   Sodium 138 136 136   Potassium 3.7 3.6 3.9   Chloride 107 106 105   CO2 21 22 23    BUN 5.0* 8.0 10.0   Creatinine 0.7 0.8 0.8   Glucose 98 106* 101*   Calcium 8.4* 8.6 9.0       Recent Labs   Lab 12/11/21  0541 12/10/21  0821   ALT 22 16   AST (SGOT) 23 20   Bilirubin, Total 0.7 0.5   Albumin 3.3* 3.5   Alkaline Phosphatase 69 70       Recent BLOOD CULTURE No results for input(s): CXBLD in the last 24 hours.  Recent URINE CULTURE Invalid input(s): CXURN    MICRO:    / blood cx    Rads:   US Venous Duplex Doppler Leg Bilateral    Result Date: 12/12/2021   No sonographic evidence for right or left lower extremity deep venous thrombosis. Georgiana Spinner, MD 12/12/2021 4:01 PM       Assessment:   Right thigh/groin abscess.  She has central fluctuance.  Mild leukocytosis.  CT scan shows cellulitic change but no obvious abscess.  Clinically, she has an abscess with central softness.    Plan:     Patient need incision and drainage of the abscess.  We will send a culture.  Most  likely this is all related to normal skin flora.  She has no allergies.  Currently on Unasyn which should be adequate.  Potential for discharge after the abscess was drained.  We have general surgery to see the patient.

## 2021-12-14 ENCOUNTER — Other Ambulatory Visit: Payer: Self-pay | Admitting: Family Medicine

## 2021-12-14 DIAGNOSIS — F411 Generalized anxiety disorder: Secondary | ICD-10-CM

## 2021-12-14 DIAGNOSIS — N809 Endometriosis, unspecified: Secondary | ICD-10-CM

## 2021-12-14 LAB — BASIC METABOLIC PANEL
Anion Gap: 10 (ref 5.0–15.0)
BUN: 5 mg/dL — ABNORMAL LOW (ref 7.0–21.0)
CO2: 24 mEq/L (ref 17–29)
Calcium: 8.8 mg/dL (ref 8.5–10.5)
Chloride: 106 mEq/L (ref 99–111)
Creatinine: 0.6 mg/dL (ref 0.4–1.0)
Glucose: 112 mg/dL — ABNORMAL HIGH (ref 70–100)
Potassium: 3.7 mEq/L (ref 3.5–5.3)
Sodium: 140 mEq/L (ref 135–145)

## 2021-12-14 LAB — CBC
Absolute NRBC: 0 10*3/uL (ref 0.00–0.00)
Hematocrit: 35.2 % (ref 34.7–43.7)
Hgb: 11.4 g/dL (ref 11.4–14.8)
MCH: 23.9 pg — ABNORMAL LOW (ref 25.1–33.5)
MCHC: 32.4 g/dL (ref 31.5–35.8)
MCV: 73.8 fL — ABNORMAL LOW (ref 78.0–96.0)
MPV: 10.4 fL (ref 8.9–12.5)
Nucleated RBC: 0 /100 WBC (ref 0.0–0.0)
Platelets: 411 10*3/uL — ABNORMAL HIGH (ref 142–346)
RBC: 4.77 10*6/uL (ref 3.90–5.10)
RDW: 15 % (ref 11–15)
WBC: 14.55 10*3/uL — ABNORMAL HIGH (ref 3.10–9.50)

## 2021-12-14 LAB — GFR: EGFR: >60

## 2021-12-14 MED ORDER — BISACODYL 10 MG RE SUPP
10.0000 mg | Freq: Every day | RECTAL | Status: DC | PRN
Start: 2021-12-14 — End: 2021-12-15
  Administered 2021-12-14: 10 mg via RECTAL
  Filled 2021-12-14: qty 1

## 2021-12-14 MED ORDER — SODIUM CHLORIDE 0.9 % IV MBP
3.0000 g | Freq: Four times a day (QID) | INTRAVENOUS | Status: DC
Start: 2021-12-14 — End: 2021-12-15
  Administered 2021-12-14 – 2021-12-15 (×4): 3 g via INTRAVENOUS
  Filled 2021-12-14 (×8): qty 20

## 2021-12-14 MED ORDER — SODIUM CHLORIDE 0.9 % IV SOLN
4.0000 mg/kg | INTRAVENOUS | Status: DC
Start: 2021-12-14 — End: 2021-12-15
  Administered 2021-12-14 – 2021-12-15 (×2): 500 mg via INTRAVENOUS
  Filled 2021-12-14 (×3): qty 500

## 2021-12-14 MED ORDER — SENNOSIDES-DOCUSATE SODIUM 8.6-50 MG PO TABS
1.0000 | ORAL_TABLET | Freq: Every evening | ORAL | Status: DC
Start: 2021-12-14 — End: 2021-12-15
  Administered 2021-12-14: 1 via ORAL
  Filled 2021-12-14: qty 1

## 2021-12-14 MED ORDER — BISMUTH SUBSALICYLATE 262 MG/15ML PO SUSP
30.0000 mL | Freq: Every day | ORAL | Status: DC
Start: 2021-12-14 — End: 2021-12-15
  Administered 2021-12-14 – 2021-12-15 (×2): 30 mL via ORAL
  Filled 2021-12-14: qty 120

## 2021-12-14 NOTE — Progress Notes (Signed)
Infectious disease progress note:    Unasyn #3  Peripheral IV    36 year old female with hypertension and obesity.  Presents with swelling pain and redness in the right upper medial thigh.  Physical examination was consistent with abscess.    She had incision and drainage done last evening by general surgery.  Purulent fluid was drained.  Sent for culture.    On review of systems:  She is having a lot of pain.  Taking narcotic pain medications  Complaining of nausea and had some vomiting.  No appetite  No shortness of breath or cough  No abdominal pains  Having her menses.  She is having pain in the right thigh.      BP 122/83   Pulse 90   Temp 98.2 F (36.8 C) (Oral)   Resp 16   Ht 1.676 m (5\' 6" )   Wt 115 kg (253 lb 8.5 oz)   LMP 11/12/2021   SpO2 99%   BMI 40.92 kg/m   Fevers are down.    No acute distress  Head neck exams unremarkable  Lungs clear  Heart regular rate and rhythm  Abdomen is soft and not tender  Right groin still with tenderness.  The wound is packed which may be contributing to some of the tenderness.  No significant cellulitis  There is just bloody drainage on the dressing.  No pus.    WBC 14.6.  Hematocrit 35.2.  Platelets of 411.  Creatinine 0.6    Right thigh abscess with moderate gram-positive cocci on stain.  MRSA swabs negative  Blood cultures negative to date    Assessment and plan:    Hypertension  Obesity  Right thigh/groin abscess.  Postoperative day #1 status post I&D.  Gram-positive cocci on stain.  It will probably take another 1 to 2 days to get finalized culture results.  Failed outpatient therapy with clindamycin    I am going to start her on daptomycin 500 mg IV daily.  I told her that we could certainly see her daily in our office infusion center and give her daptomycin until we have final culture results back.  She is stating that the pain is too bad and she wants to stay in the hospital.  I think a lot of her side effects are from the narcotics and would consider to  switching over to nonsteroidal medication.  Unfortunately, I cannot give her Zyvox because she is on an antidepressant medication.      01/10/2022, M.D.  [facility], [facility]  Pager [phone]  [phone]

## 2021-12-14 NOTE — Progress Notes (Cosign Needed)
POST OPERATIVE PROGRESS NOTE  [facility] [phone]    Date Time: 12/14/21 1:15 PM  Patient Name: Linda [name]      ASSESSMENT:   POD1 I&D Right Groin Abscess    Abscess site is improved with reduced erythema and pain. WBC 14.5. Tolerating Heart Healthy Diet  PLAN:     -Local wound care per WOC-RN  -No further surgical intervention necessary at this time  -ID Managing IV Abx regimen  -No further input from [facility] / Surgery; please do not hesitate to contact with any clinical changes.  -No outpatient follow up necessary; will attach contact information if patient has concerns or questions to be addressed in outpatient setting.     Pt seen with MD Assadipour    SUBJECTIVE:   The patient is doing well.  Post operative pain is well controlled with medications.  Current dietary status:  Diet heart healthy and tolerating well.  Flatus: yes. BM:  yes.  Additional complaints: none       OBJECTIVE:   Current Vitals:   Vitals:    12/14/21 1139   BP: 129/84   Pulse: 88   Resp:    Temp: 99 F (37.2 C)   SpO2: 100%       Intake and Output Summary (Last 24 hours):  No intake/output data recorded.    Labs:     Results       Procedure Component Value Units Date/Time    GFR [[id number]] Collected: 12/14/21 0636     Updated: 12/14/21 0708     EGFR >60.0    Basic Metabolic Panel [[id number]]  (Abnormal) Collected: 12/14/21 0636    Specimen: Blood Updated: 12/14/21 0708     Glucose 112 mg/dL      BUN 5.0 mg/dL      Creatinine 0.6 mg/dL      Calcium 8.8 mg/dL      Sodium 782 mEq/L      Potassium 3.7 mEq/L      Chloride 106 mEq/L      CO2 24 mEq/L      Anion Gap 10.0    CBC without differential [[id number]]  (Abnormal) Collected: 12/14/21 0636    Specimen: Blood Updated: 12/14/21 0651     WBC 14.55 x10 3/uL      Hgb 11.4 g/dL      Hematocrit 57.8 %      Platelets 411 x10 3/uL      RBC 4.77 x10 6/uL      MCV 73.8 fL      MCH 23.9 pg      MCHC 32.4 g/dL      RDW 15 %      MPV 10.4 fL      Nucleated RBC 0.0 /100 WBC      Absolute NRBC 0.00  x10 3/uL     Culture + Gram Stain,Aerobic, Wound [[id number]] Collected: 12/13/21 1616    Specimen: Wound from Cellulitis Updated: 12/13/21 2326    Narrative:      ORDER#: [id number]                                    ORDERED BY: Derryl Harbor  SOURCE: Cellulitis Right inner thigh                 COLLECTED:  12/13/21 16:16  ANTIBIOTICS AT COLL.:  RECEIVED :  12/13/21 19:55  Stain, Gram                                FINAL       12/13/21 23:26  12/13/21   Many WBCs             No Squamous epithelial cells seen             Moderate Gram positive cocci  Culture and Gram Stain, Aerobic, Wound     PENDING      Culture Blood Aerobic and Anaerobic [[id number]] Collected: 12/11/21 0600    Specimen: Arm from Blood, Venipuncture Updated: 12/13/21 1421    Narrative:      ORDER#: [id number]                                    ORDERED BY: Darcel Bayley, LISA  SOURCE: Blood, Venipuncture arm                      COLLECTED:  12/11/21 06:00  ANTIBIOTICS AT COLL.:                                RECEIVED :  12/11/21 13:47  Culture Blood Aerobic and Anaerobic        PRELIM      12/13/21 14:21  12/12/21   No Growth after 1 day/s of incubation.  12/13/21   No Growth after 2 day/s of incubation.      Culture Blood Aerobic and Anaerobic [[id number]] Collected: 12/11/21 0600    Specimen: Arm from Blood, Venipuncture Updated: 12/13/21 1421    Narrative:      ORDER#: [id number]                                    ORDERED BY: Darcel Bayley, LISA  SOURCE: Blood, Venipuncture arm                      COLLECTED:  12/11/21 06:00  ANTIBIOTICS AT COLL.:                                RECEIVED :  12/11/21 13:47  Culture Blood Aerobic and Anaerobic        PRELIM      12/13/21 14:21  12/12/21   No Growth after 1 day/s of incubation.  12/13/21   No Growth after 2 day/s of incubation.      MRSA culture [[id number]] Collected: 12/12/21 1049    Specimen: Culturette from Nasal Swab Updated: 12/13/21 1407     Culture MRSA Surveillance Negative  for Methicillin Resistant Staph aureus    MRSA culture [[id number]] Collected: 12/12/21 1049    Specimen: Culturette from Throat Updated: 12/13/21 1407     Culture MRSA Surveillance Negative for Methicillin Resistant Staph aureus            Rads:     Radiology Results (24 Hour)       ** No results found for the last 24 hours. **            Physical Exam:     Mental status - alert,  oriented to person, place, and time  Chest - no tachypnea, retractions or cyanosis  Heart - normal rate and regular rhythm  Abdomen - soft, nontender, nondistended, no masses or organomegaly  Wound - clean, healing, expectant resolving induration and erythema  Extremities - peripheral pulses normal, no pedal edema, no clubbing or cyanosis      Signed by: Tamala Bari, FNP

## 2021-12-14 NOTE — Progress Notes (Signed)
Patient alert and oriented X4. VSS, N/V successfully controlled with Pepto Bismol. Patient reports decreased appetite, encouraged to have small frequent meals. Wound dressing changed when soiled, packing left in place per wound nurse. Pain well controlled after switch to PO percocet in anticipation of discharge. Patient currently resting comfortably, bed in lowest position, call bell within reach. Will continue to monitor.

## 2021-12-14 NOTE — Progress Notes (Signed)
[facility]  HOSPITALIST  PROGRESS NOTE      Patient: Linda [name]  Date: 12/14/2021   LOS: 1 Days  Admission Date: 12/11/2021   MRN: [id number]  Attending:  Manning Charity, MD     ASSESSMENT/PLAN     Linda [name] is a 36 y.o. female admitted with cellulitis of right upper, inner thigh.    1. Sepsis due to Cellulitis; S/P I and D of abscess on 3/13; blood and wound culture is pending. WBC is trending down compared to the day of admission. ID on board.Continue Unasyn and Daptomycin    2. Fibroid uterus: accidental finding of pelvic mass and follow [place] noted fibroid uterus. The pt states that she has chronic fibroids. The pt to follow up as out patient with her OB.     3. Hypertension    Continue home med     4. Anxiety      Continue home med.    5. Constipation    Dulcolax, Senna, and Miralax     6. Microcytic Anemia likely due to infection. Improving  Hgb 11.4  MCV 73.8  MCH 23.9      Analgesia: Percocet oral    FEN  Fluids: SL  Electrolytes: Noted  Nutrition: Regular Diet  Body mass index is 40.92 kg/m. Obese class 3         Safety Checklist  DVT prophylaxis: Chemical   Foley: Not present   IVs:  Peripheral IV   PT/OT: Not needed   Daily CBC & or Chem ordered: Yes, due to clinical and lab instability       Patient Lines/Drains/Airways Status       Active PICC Line / CVC Line / PIV Line / Drain / Airway / Intraosseous Line / Epidural Line / ART Line / Line / Wound / Pressure Ulcer / NG/OG Tube       Name Placement date Placement time Site Days    Peripheral IV 12/11/21 20 G Right Antecubital 12/11/21  0538  Antecubital  3    Peripheral IV 12/11/21 20 G Left Antecubital 12/11/21  --  Antecubital  3    Wound 12/14/21 Surgical Incision Thigh Right;Medial;Upper;Inner abscess s/p I&D, site tunneled 4 cm 12/14/21  1000  Thigh  less than 1                    MD/RN rounds: yes       Code Status: full code    DISPO: home with family    Family Contact: Sister    Care Plan discussed with nursing, consultants, case  Production designer, theatre/television/film.       SUBJECTIVE     Linda [name] states she states that she felling better after the I and D.    MEDICATIONS     Current Facility-Administered Medications   Medication Dose Route Frequency    ampicillin-sulbactam  1.5 g Intravenous Q6H    bismuth subsalicylate  30 mL Oral Daily    DAPTOmycin (CUBICIN) IVPB  4 mg/kg Intravenous Q24H    docusate sodium  100 mg Oral BID    DULoxetine  20 mg Oral Daily    enoxaparin  40 mg Subcutaneous Daily    iron polysaccharides  150 mg Oral Daily    NIFEdipine XL  30 mg Oral Daily    polyethylene glycol  17 g Oral Daily    senna-docusate  1 tablet Oral QHS    vitamin C  500 mg Oral Daily  ROS     All other systems not mentioned in subjective reviewed and negative    PHYSICAL EXAM     Vitals:    12/14/21 1139   BP: 129/84   Pulse: 88   Resp:    Temp: 99 F (37.2 C)   SpO2: 100%       Temperature: Temp  Min: 98.2 F (36.8 C)  Max: 99 F (37.2 C)  Pulse: Pulse  Min: 87  Max: 99  Respiratory: Resp  Min: 16  Max: 17  Non-Invasive BP: BP  Min: 105/71  Max: 158/91  Pulse Oximetry SpO2  Min: 97 %  Max: 100 %    Intake and Output Summary (Last 24 hours) at Date Time  No intake or output data in the 24 hours ending 12/14/21 1452    GEN APPEARANCE: No acute distress  HEENT: Conjunctiva Clear  NECK: no masses  CVS: RRR, S1, S2; No Murmur  LUNGS: CTAB  ABD: Soft; No TTP; + Normoactive BS  EXT: No edema  SKIN: No rash or Lesions  NEURO:  No Focal neurological deficits      LABS     Recent Labs   Lab 12/14/21  0636 12/13/21  0521 12/12/21  0551   WBC 14.55* 14.47* 15.55*   RBC 4.77 4.54 4.62   Hgb 11.4 10.8* 11.1*   Hematocrit 35.2 33.6* 35.2   MCV 73.8* 74.0* 76.2*   Platelets 411* 318 265       Recent Labs   Lab 12/14/21  0636 12/13/21  0521 12/11/21  0541 12/10/21  0821   Sodium 140 138 136 136   Potassium 3.7 3.7 3.6 3.9   Chloride 106 107 106 105   CO2 24 21 22 23    BUN 5.0* 5.0* 8.0 10.0   Creatinine 0.6 0.7 0.8 0.8   Glucose 112* 98 106* 101*   Calcium 8.8 8.4* 8.6  9.0       Recent Labs   Lab 12/11/21  0541 12/10/21  0821   ALT 22 16   AST (SGOT) 23 20   Bilirubin, Total 0.7 0.5   Albumin 3.3* 3.5   Alkaline Phosphatase 69 70                   Microbiology Results (last 15 days)       Procedure Component Value Units Date/Time    Culture + Gram Stain,Aerobic, Wound [[id number]] Collected: 12/13/21 1616    Order Status: Completed Specimen: Wound from Cellulitis Updated: 12/13/21 2326    Narrative:      ORDER#: [id number]                                    ORDERED BY: Derryl Harbor  SOURCE: Cellulitis Right inner thigh                 COLLECTED:  12/13/21 16:16  ANTIBIOTICS AT COLL.:                                RECEIVED :  12/13/21 19:55  Stain, Gram                                FINAL       12/13/21 23:26  12/13/21   Many WBCs  No Squamous epithelial cells seen             Moderate Gram positive cocci  Culture and Gram Stain, Aerobic, Wound     PENDING      MRSA culture [[id number]] Collected: 12/12/21 1049    Order Status: Completed Specimen: Culturette from Nasal Swab Updated: 12/13/21 1407     Culture MRSA Surveillance Negative for Methicillin Resistant Staph aureus    MRSA culture [[id number]] Collected: 12/12/21 1049    Order Status: Completed Specimen: Culturette from Throat Updated: 12/13/21 1407     Culture MRSA Surveillance Negative for Methicillin Resistant Staph aureus    COVID-19 (SARS-CoV-2) only (Liat Rapid) asymptomatic admission [[id number]] Collected: 12/11/21 0744    Order Status: Completed Specimen: Nasopharyngeal Updated: 12/11/21 0811     Purpose of COVID testing Screening     SARS-CoV-2 Specimen Source Nasal Swab     SARS CoV 2 Overall Result Not Detected     Comment: __________________________________________________  -A result of "Detected" indicates POSITIVE for the    presence of SARS CoV-2 RNA  -A result of "Not Detected" indicates NEGATIVE for the    presence of SARS CoV-2  RNA  __________________________________________________________  Test performed using the Roche cobas Liat SARS-CoV-2 assay. This assay is  only for use under the Food and Drug Administration's Emergency Use  Authorization. This is a real-time RT-PCR assay for the qualitative  detection of SARS-CoV-2 RNA. Viral nucleic acids may persist in vivo,  independent of viability. Detection of viral nucleic acid does not imply the  presence of infectious virus, or that virus nucleic acid is the cause of  clinical symptoms. Negative results do not preclude SARS-CoV-2 infection and  should not be used as the sole basis for diagnosis, treatment or other  patient management decisions. Negative results must be combined with  clinical observations, patient history, and/or epidemiological information.  Invalid results may be due to inhibiting substances in the specimen and  recollection should occur. Please see Fact Sheets for patients and providers  located:  WirelessDSLBlog.no         Narrative:      o Collect and clearly label specimen type:  o PREFERRED-Upper respiratory specimen: One Nasal Swab in  Transport Media.  o Hand deliver to laboratory ASAP  Indication for testing->Extended care facility admission to  semi private room  Screening    Culture Blood Aerobic and Anaerobic [[id number]] Collected: 12/11/21 0600    Order Status: Completed Specimen: Arm from Blood, Venipuncture Updated: 12/14/21 1421    Narrative:      ORDER#: [id number]                                    ORDERED BY: Darcel Bayley, LISA  SOURCE: Blood, Venipuncture arm                      COLLECTED:  12/11/21 06:00  ANTIBIOTICS AT COLL.:                                RECEIVED :  12/11/21 13:47  Culture Blood Aerobic and Anaerobic        PRELIM      12/14/21 14:21  12/12/21   No Growth after 1 day/s of incubation.  12/13/21   No Growth after 2 day/s of incubation.  12/14/21   No Growth  after 3 day/s of incubation.      Culture Blood  Aerobic and Anaerobic [[id number]] Collected: 12/11/21 0600    Order Status: Completed Specimen: Arm from Blood, Venipuncture Updated: 12/14/21 1421    Narrative:      ORDER#: [id number]                                    ORDERED BY: Darcel Bayley, LISA  SOURCE: Blood, Venipuncture arm                      COLLECTED:  12/11/21 06:00  ANTIBIOTICS AT COLL.:                                RECEIVED :  12/11/21 13:47  Culture Blood Aerobic and Anaerobic        PRELIM      12/14/21 14:21  12/12/21   No Growth after 1 day/s of incubation.  12/13/21   No Growth after 2 day/s of incubation.  12/14/21   No Growth after 3 day/s of incubation.               RADIOLOGY     Radiological Procedure xray personally reviewed and concur with radiologist reports unless stated otherwise.    US Venous Duplex Doppler Leg Bilateral   Final Result       No sonographic evidence for right or left lower extremity deep venous   thrombosis.      Georgiana Spinner, MD   12/12/2021 4:01 PM      US Pelvis Complete   Final Result         1. Fibroid uterus.   2. Probable right hydrosalpinx.   3. Left ovary not visualized.       Georgiana Spinner, MD   12/11/2021 12:35 PM      CT Lower Extremity Right W Contrast   Final Result      1.Subcutaneous infiltration/edema and skin thickening along the medial   proximal thigh consistent with soft tissue infection such as cellulitis.   No drainable fluid collections or soft tissue gas.      2.Incomplete characterization of a pelvic mass. Recommend further   evaluation with follow-up pelvic ultrasound.      These results /recommendations were discussed with and acknowledged by   Everette Rank, MD on 12/11/2021 8:21 AM.      Emeline Darling, MD   12/11/2021 8:22 AM          Signed,  Lavena Stanford, FNP  2:52 PM 12/14/2021

## 2021-12-14 NOTE — Op Note (Unsigned)
Procedure Date: 12/13/2021    Patient Type: I    SURGEON: Kathlyn Sacramento. Hobert Poplaski, MD    PREOPERATIVE DIAGNOSIS:  Soft tissue abscess, right groin.    POSTOPERATIVE DIAGNOSIS:  Soft tissue abscess, right groin.    TITLE OF PROCEDURE:  Incision and drainage of abscess, right groin.    ANESTHESIA:  1% lidocaine.    DESCRIPTION OF PROCEDURE:  After informed consent was obtained, the area of concern marked and agreed   upon, and all questions were answered.  The patient remained in her bed in   her room.  The area was prepped and draped in the usual fashion.  It was   infiltrated with 1% lidocaine.  Following this, a 1.1 cm circular incision   was made.  Purulent material came forth.  This was cultured and following   this, the wound was then packed with Xeroform gauze.  The patient tolerated  it well.      D: 12/14/2021 05:41 AM by Kathlyn Sacramento. Jewelz Ricklefs, MD [phone])  T: 12/14/2021 07:50 AM by MaFr [phone] (Conf: [id number]) (Doc ID:   [id number])

## 2021-12-14 NOTE — Plan of Care (Signed)
Problem: Pain  Goal: Pain at adequate level as identified by patient  Outcome: Progressing  Flowsheets (Taken 12/14/2021 0302 by Mariea Stable, RN)  Pain at adequate level as identified by patient:   Identify patient comfort function goal   Assess for risk of opioid induced respiratory depression, including snoring/sleep apnea. Alert healthcare team of risk factors identified.   Assess pain on admission, during daily assessment and/or before any "as needed" intervention(s)   Reassess pain within 30-60 minutes of any procedure/intervention, per Pain Assessment, Intervention, Reassessment (AIR) Cycle   Evaluate if patient comfort function goal is met   Evaluate patient's satisfaction with pain management progress   Offer non-pharmacological pain management interventions     Problem: Side Effects from Pain Analgesia  Goal: Patient will experience minimal side effects of analgesic therapy  Outcome: Progressing  Flowsheets (Taken 12/14/2021 0302 by Mariea Stable, RN)  Patient will experience minimal side effects of analgesic therapy:   Monitor/assess patient's respiratory status (RR depth, effort, breath sounds)   Prevent/manage side effects per LIP orders (i.e. nausea, vomiting, pruritus, constipation, urinary retention, etc.)   Evaluate for opioid-induced sedation with appropriate assessment tool (i.e. POSS)   Assess for changes in cognitive function     Problem: Infection  Goal: Free from infection  Outcome: Progressing  Flowsheets (Taken 12/13/2021 1420 by Fredia Sorrow, RN)  Free from infection:   Assess for signs/symptoms of infection   Utilize isolation precautions per protocol/policy   Consult/collaborate with Infection Preventionist   Utilize sepsis protocol     Problem: Compromised skin integrity  Goal: Skin integrity is maintained or improved  Outcome: Progressing  Flowsheets (Taken 12/12/2021 1132 by Jolinda Croak, RN)  Skin integrity is maintained or improved:   Assess Braden  Scale every shift   Turn or reposition patient every 2 hours or as needed unless able to reposition self   Increase activity as tolerated/progressive mobility   Relieve pressure to bony prominences   Avoid shearing   Keep skin clean and dry     Problem: Nutrition  Goal: Nutritional intake is adequate  Outcome: Progressing  Flowsheets (Taken 12/12/2021 1132 by Jolinda Croak, RN)  Nutritional intake is adequate:   Assist patient with meals/food selection   Allow adequate time for meals   Include patient/patient care companion in decisions related to nutrition   Assess anorexia, appetite, and amount of meal/food tolerated

## 2021-12-14 NOTE — Consults (Signed)
Wound Ostomy Continence Consultation    Date Time: 12/14/21 10:21 AM  Patient Name: JEIDY, HOERNER  Consulting Service: [facility] Day: 4     Reason for Consult   Surgical wound     Assessment & Plan   Assessment:   Patient awake and alert in bed. S/p I&D to right medial upper thigh yesterday. Surrounding skin very indurated, warm to touch and tender. Old dressing with thick tan and serosanguineous strike through drainage. Old gauze and tape removed which was very uncomfortable for patient. Old packing removed. Wound pink and red, moist with some tan adherent slough superficially. Wound then probed, measuring 4 cm deep, around 1-2 cm width. Hospitalist at bedside assessing wound at this time as well.  Wound then cleansed with vashe wound solution. 1/2 inch AMB iodoform packing strip cut to fit then gently packed into wound bed. Procedure extremely painful and uncomfortable for patient despite being medicated with pain medicine prior to. Wound then covered with gauze and adhesive foam dressing.    Wound Photography: n/a    Plan/Follow-Up:  Recommend to continue PI prevention interventions.  Recommend daily wound care to incision site- clean with vashe, pack with 1/2 inch AMD iodoform packing strip and cover with gauze and adhesive foam. Change gauze and foam as needed for PRN strike through drainage.  Patient may benefit from increased pain medication prior to next dressing change tomorrow.  Discussed with primary RN. WOC service will continue to follow and assist as necessary during this hospitalization.     Objective Findings   Braden Scale Score: 20 (12/13/21 2130)  Braden Subscales:  Sensory Perceptions: No impairment (12/13/21 2130)  Moisture: Rarely moist (12/13/21 2130)  Activity: Walks occasionally (12/13/21 2130)  Mobility: Slightly limited (12/13/21 2130)  Nutrition: Adequate (12/13/21 2130)  Friction and Shear: No apparent problem (12/13/21 2130)    Ht Readings from Last 1 Encounters:    12/11/21 1.676 m (5\' 6" )     Wt Readings from Last 3 Encounters:   12/11/21 115 kg (253 lb 8.5 oz)   12/10/21 112.3 kg (247 lb 9.2 oz)     Body mass index is 40.92 kg/m.    Current Diet:   Diet heart healthy     Specialty Bed:   Versacare with AccuMax appropriate (standard in-house hospital bed and surface)    History of Present Illness   This is a 36 y.o. female  has a past medical history of Anxiety, Depression, and Endometriosis..  Admitted with Cellulitis, unspecified cellulitis site      Pertinent skin and/or ostomy history includes- n/a.      Lab   Significant Lab Values:    Recent Labs     12/14/21  0636   WBC 14.55*   RBC 4.77   Hgb 11.4   Hematocrit 35.2   Sodium 140   Potassium 3.7   Chloride 106   CO2 24   BUN 5.0*   Creatinine 0.6   Calcium 8.8   EGFR >60.0   Glucose 112*       Culture Results- n/a    Lowry Ram BSN, RN, PCCN  Wound and Ostomy   [phone]

## 2021-12-14 NOTE — Progress Notes (Addendum)
Pt A&O x 4 , on room air, independent.  Pain managed with Morphine IV and percocet, Zofran given for nausea and vomiting. Pt on period accompanied with abdominal cramping. Gauze dressing in R inner thigh changed 2x due to soil, packing still intact.  Safety measures in place. Continue plan of care.    Problem: Safety  Goal: Patient will be free from injury during hospitalization  Outcome: Progressing  Flowsheets (Taken 12/14/2021 0302)  Patient will be free from injury during hospitalization:   Assess patient's risk for falls and implement fall prevention plan of care per policy   Provide and maintain safe environment   Use appropriate transfer methods   Ensure appropriate safety devices are available at the bedside   [place] rounding   Assess for patients risk for elopement and implement Elopement Risk Plan per policy   Include patient/ family/ care giver in decisions related to safety  Goal: Patient will be free from infection during hospitalization  Outcome: Progressing  Flowsheets (Taken 12/14/2021 0302)  Free from Infection during hospitalization:   Assess and monitor for signs and symptoms of infection   Monitor all insertion sites (i.e. indwelling lines, tubes, urinary catheters, and drains)   Encourage patient and family to use good hand hygiene technique     Problem: Pain  Goal: Pain at adequate level as identified by patient  Outcome: Progressing  Flowsheets (Taken 12/14/2021 0302)  Pain at adequate level as identified by patient:   Identify patient comfort function goal   Assess for risk of opioid induced respiratory depression, including snoring/sleep apnea. Alert healthcare team of risk factors identified.   Assess pain on admission, during daily assessment and/or before any "as needed" intervention(s)   Reassess pain within 30-60 minutes of any procedure/intervention, per Pain Assessment, Intervention, Reassessment (AIR) Cycle   Evaluate if patient comfort function goal is met   Evaluate patient's  satisfaction with pain management progress   Offer non-pharmacological pain management interventions     Problem: Side Effects from Pain Analgesia  Goal: Patient will experience minimal side effects of analgesic therapy  Outcome: Progressing  Flowsheets (Taken 12/14/2021 0302)  Patient will experience minimal side effects of analgesic therapy:   Monitor/assess patient's respiratory status (RR depth, effort, breath sounds)   Prevent/manage side effects per LIP orders (i.e. nausea, vomiting, pruritus, constipation, urinary retention, etc.)   Evaluate for opioid-induced sedation with appropriate assessment tool (i.e. POSS)   Assess for changes in cognitive function

## 2021-12-15 ENCOUNTER — Other Ambulatory Visit: Payer: Self-pay | Admitting: Family Medicine

## 2021-12-15 ENCOUNTER — Encounter (INDEPENDENT_AMBULATORY_CARE_PROVIDER_SITE_OTHER): Payer: Self-pay

## 2021-12-15 DIAGNOSIS — I10 Essential (primary) hypertension: Secondary | ICD-10-CM

## 2021-12-15 MED ORDER — POLYETHYLENE GLYCOL 3350 17 G PO PACK
17.0000 g | PACK | Freq: Every day | ORAL | 0 refills | Status: DC | PRN
Start: 2021-12-15 — End: 2022-12-01

## 2021-12-15 MED ORDER — TRAMADOL HCL 50 MG PO TABS
50.0000 mg | ORAL_TABLET | Freq: Three times a day (TID) | ORAL | 0 refills | Status: AC | PRN
Start: 2021-12-15 — End: 2021-12-22

## 2021-12-15 MED ORDER — TRAMADOL HCL 50 MG PO TABS
50.0000 mg | ORAL_TABLET | Freq: Four times a day (QID) | ORAL | Status: DC | PRN
Start: 2021-12-15 — End: 2021-12-15

## 2021-12-15 MED ORDER — MAGNESIUM HYDROXIDE 400 MG/5ML PO SUSP
30.0000 mL | Freq: Every day | ORAL | Status: DC
Start: 2021-12-15 — End: 2021-12-15
  Administered 2021-12-15: 30 mL via ORAL
  Filled 2021-12-15: qty 30

## 2021-12-15 MED ORDER — BISACODYL 10 MG RE SUPP
10.0000 mg | Freq: Every day | RECTAL | 0 refills | Status: DC | PRN
Start: 2021-12-15 — End: 2022-12-01

## 2021-12-15 MED ORDER — FLEET ENEMA 7-19 GM/118ML RE ENEM
1.0000 | ENEMA | Freq: Once | RECTAL | Status: AC
Start: 2021-12-15 — End: 2021-12-15
  Administered 2021-12-15: 1 via RECTAL
  Filled 2021-12-15: qty 1

## 2021-12-15 MED ORDER — SENNOSIDES-DOCUSATE SODIUM 8.6-50 MG PO TABS
1.0000 | ORAL_TABLET | Freq: Every evening | ORAL | 0 refills | Status: DC
Start: 2021-12-15 — End: 2022-12-01

## 2021-12-15 NOTE — Discharge Instr - AVS First Page (Addendum)
Reason for your Hospital Admission:  abscesss      Instructions for after your discharge:  Follow up in Dr. Renda Rolls office 12/16/21  Change dressing daily  Continue bowel regimen of peri-colace and as needed dulcolax and miralax for constipation  Follow-up with Dr. Ed Blalock for fibroids

## 2021-12-15 NOTE — Plan of Care (Signed)
Problem: Safety  Goal: Patient will be free from infection during hospitalization  Outcome: Progressing  Flowsheets (Taken 12/15/2021 0406 by Mariea Stable, RN)  Free from Infection during hospitalization:   Assess and monitor for signs and symptoms of infection   Monitor lab/diagnostic results   Monitor all insertion sites (i.e. indwelling lines, tubes, urinary catheters, and drains)   Encourage patient and family to use good hand hygiene technique     Problem: Pain  Goal: Pain at adequate level as identified by patient  Outcome: Progressing  Flowsheets (Taken 12/15/2021 0406 by Mariea Stable, RN)  Pain at adequate level as identified by patient:   Identify patient comfort function goal   Assess for risk of opioid induced respiratory depression, including snoring/sleep apnea. Alert healthcare team of risk factors identified.   Assess pain on admission, during daily assessment and/or before any "as needed" intervention(s)   Reassess pain within 30-60 minutes of any procedure/intervention, per Pain Assessment, Intervention, Reassessment (AIR) Cycle   Evaluate if patient comfort function goal is met   Evaluate patient's satisfaction with pain management progress   Offer non-pharmacological pain management interventions     Problem: Compromised skin integrity  Goal: Skin integrity is maintained or improved  Outcome: Progressing  Flowsheets (Taken 12/12/2021 1132 by Jolinda Croak, RN)  Skin integrity is maintained or improved:   Assess Braden Scale every shift   Turn or reposition patient every 2 hours or as needed unless able to reposition self   Increase activity as tolerated/progressive mobility   Relieve pressure to bony prominences   Avoid shearing   Keep skin clean and dry     Problem: Compromised Tissue integrity  Goal: Nutritional status is improving  Outcome: Progressing  Flowsheets (Taken 12/15/2021 0742)  Nutritional status is improving:   Allow adequate time for meals    Encourage patient to take dietary supplement(s) as ordered

## 2021-12-15 NOTE — Progress Notes (Addendum)
CASE MANAGEMENT PROGRESS NOTE      Patient: Linda [name] (36 y.o. female)  Admission Date: 12/11/2021 [facility] Day 2)    Active Hospital Problems    Diagnosis    Cellulitis, unspecified cellulitis site       Length of stay: Hospital Day 2    Met with Linda [name] and reviewed POC/DCP. Currently has a discharge order in.  She shared that she has not had a bowel movement in several days and does not feel able to [place] home until she does; thinks MD wanted her to have one prior to [place].  Discussed with RN who updated MD. Plan for additional tx this PM.    Linda [name] drove herself to hospital and will drive herself home and to her follow up appts: ID office and [facility]. Wound packing removed, no need for HHRN. Resides alone; has local support.    Joseph Berkshire, LCSW  [phone]

## 2021-12-15 NOTE — Progress Notes (Signed)
Pt A&O x 4, independent with ADL's, wound dressing on R inner thigh changed, packing left in place per wound nurse. Pain managed by percocet with a good relief. Denies nausea and vomiting.  Pt resting comfortably.   Safety measures in place. Continue plan of care.    Problem: Safety  Goal: Patient will be free from injury during hospitalization  Outcome: Progressing  Flowsheets (Taken 12/15/2021 0406)  Patient will be free from injury during hospitalization:   Assess patient's risk for falls and implement fall prevention plan of care per policy   Provide and maintain safe environment   Ensure appropriate safety devices are available at the bedside   Use appropriate transfer methods   Include patient/ family/ care giver in decisions related to safety   Hourly rounding   Assess for patients risk for elopement and implement Elopement Risk Plan per policy  Goal: Patient will be free from infection during hospitalization  Outcome: Progressing  Flowsheets (Taken 12/15/2021 0406)  Free from Infection during hospitalization:   Assess and monitor for signs and symptoms of infection   Monitor lab/diagnostic results   Monitor all insertion sites (i.e. indwelling lines, tubes, urinary catheters, and drains)   Encourage patient and family to use good hand hygiene technique     Problem: Pain  Goal: Pain at adequate level as identified by patient  Outcome: Progressing  Flowsheets (Taken 12/15/2021 0406)  Pain at adequate level as identified by patient:   Identify patient comfort function goal   Assess for risk of opioid induced respiratory depression, including snoring/sleep apnea. Alert healthcare team of risk factors identified.   Assess pain on admission, during daily assessment and/or before any "as needed" intervention(s)   Reassess pain within 30-60 minutes of any procedure/intervention, per Pain Assessment, Intervention, Reassessment (AIR) Cycle   Evaluate if patient comfort function goal is met   Evaluate patient's  satisfaction with pain management progress   Offer non-pharmacological pain management interventions     Problem: Side Effects from Pain Analgesia  Goal: Patient will experience minimal side effects of analgesic therapy  Outcome: Progressing  Flowsheets (Taken 12/15/2021 0406)  Patient will experience minimal side effects of analgesic therapy:   Monitor/assess patient's respiratory status (RR depth, effort, breath sounds)   Assess for changes in cognitive function   Prevent/manage side effects per LIP orders (i.e. nausea, vomiting, pruritus, constipation, urinary retention, etc.)   Evaluate for opioid-induced sedation with appropriate assessment tool (i.e. POSS)

## 2021-12-15 NOTE — Progress Notes (Signed)
Patient alert and oriented X4. Patient reports having a bowel movement and ready for discharge. IV's removed, catheters intact. Discharge instructions reviewed, questions addressed, patient expressed understanding. Education given for wound care and dressing changes. Patient changed dressing to show understanding. Transportation to lobby arranged.

## 2021-12-15 NOTE — Discharge Summary (Signed)
Linda [name]      Patient: Linda [name]  Admission Date: 12/11/2021   DOB: 1985/12/04  Discharge Date: 12/15/2021    MRN: [id number]  Discharge Attending: Manning CharityGaige, Saralee Bolick R, MD   Referring Physician: Marisa SprinklesPcp, None, MD  PCP: Oneita HurtPcp, None, MD       DISCHARGE SUMMARY     Discharge Information   Admission Diagnosis:   Sepsis due to cellulitis  Fibroid uterus  HTN  anxiety    Discharge Diagnosis:   Sepsis present in ED due to cellulitis and abscess right upper inner thigh  Fibroid uterus  HTN  Anxiety  Microcytic anemia  constipation  Body mass index is 40.92 kg/m. Obesity class III       Admission Condition: poor  Discharge Condition: fair  Functional Status: Patient is independent with mobility/ambulation, transfers, ADL's, IADL's.    Discharge Medications:     Medication List        START taking these medications      bisacodyl 10 mg suppository  Commonly known as: DULCOLAX  Place 1 suppository (10 mg) rectally daily as needed for Constipation     polyethylene glycol 17 g packet  Commonly known as: MIRALAX  Take 17 g by mouth daily as needed (constipation)     senna-docusate 8.6-50 MG per tablet  Commonly known as: PERICOLACE  Take 1 tablet by mouth nightly     traMADol 50 MG tablet  Commonly known as: ULTRAM  Take 1 tablet (50 mg) by mouth every 8 (eight) hours as needed for Pain            CONTINUE taking these medications      DULoxetine 20 MG capsule  Commonly known as: CYMBALTA     NIFEdipine XL 30 MG 24 hr tablet  Commonly known as: PROCARDIA XL     oxyCODONE 5 MG immediate release tablet  Commonly known as: ROXICODONE  Take 1 tablet (5 mg) by mouth every 6 (six) hours as needed for Pain            STOP taking these medications      clindamycin 300 MG capsule  Commonly known as: CLEOCIN               Where to Get Your Medications        [id number] [place] [place] [id number]  [address], [place] [id number]      Phone: [phone]   bisacodyl 10 mg  suppository  polyethylene glycol 17 g packet  senna-docusate 8.6-50 MG per tablet  traMADol 50 MG tablet          Patient Lines/Drains/Airways Status       Active PICC Line / CVC Line / PIV Line / Drain / Airway / Intraosseous Line / Epidural Line / ART Line / Line / Wound / Pressure Ulcer / NG/OG Tube       Name Placement date Placement time Site Days    Peripheral IV 12/11/21 20 G Right Antecubital 12/11/21  0538  Antecubital  4    Peripheral IV 12/11/21 20 G Left Antecubital 12/11/21  --  Antecubital  4    Wound 12/14/21 Surgical Incision Thigh Right;Medial;Upper;Inner abscess s/p I&D, site tunneled 4 cm 12/14/21  1000  Thigh  1                       Hospital Course   Presentation History  See HPI for details.  Linda [name] is a 36 y.o. female with a PMHx of Anxiety and HTN who presented with complaining of right inner upper thigh swelling and redness that started on Wednesday the 8th of March 2023. She was seen at urgent care on the same days and subsequently sent home on clindamycin. She was seen in [facility] ER yesterday for continued symptoms. Of note she had bedside US in the ER yesterday (11/12/2021) that noted a pocket of fluid consistent with early abscess/cellulitis. She was then discharged from the ER after advised to continue Clindamycin. She returned today with worsening symptoms. She endorses chills ann feeling feverish although she never taken her temp at home. She is afebrile in the ER.      SIRS criteria was positive in the ER for Elevated RR and WBC. Lactic was negative and blood culture was send, a dose actinomycin was given in the ER.    Hospital Course (2 Days)     Sepsis present in ED due to cellulitis and abscess soft tissue right upper inner thigh:  - failed outpatient treatment  - s/p I&D 12/13/21. Wound culture growing Gram +  - imaging noted  - dopplers neg for DVT  - leukocytosis trended  - on unasyn. S/p vanc. Then put on daptomycin  - blood cultures  negative so far  - MRSA screen  neg  - Crp and esr elevated.  - ID consulted. Case discussed.   - packing removed, wound dressed. Plan is to discharge after IV antibiotics today and follow-up with Dr. Larry Sierras tomorrow in his clinic regarding further antibiotics based on final cultures  - daily dressing changes  - ultram prn for pain    Fibroid uterus: noted on imaging. Follow-up outpatient with gyn    HTN: home rx continued    Anxiety: home rx continued    Microcytic anemia: ferritin and iron panel noted. Oral supplementation added, but then stopped due to constipation    Constipation: colace, miralax, dulcolax, enema, and milk of mag given    Patient stable for discharge home.    Procedures/Imaging:   US Venous Duplex Doppler Leg Bilateral   Final Result       No sonographic evidence for right or left lower extremity deep venous   thrombosis.      Georgiana Spinner, MD   12/12/2021 4:01 PM      US Pelvis Complete   Final Result         1. Fibroid uterus.   2. Probable right hydrosalpinx.   3. Left ovary not visualized.       Georgiana Spinner, MD   12/11/2021 12:35 PM      CT Lower Extremity Right W Contrast   Final Result      1.Subcutaneous infiltration/edema and skin thickening along the medial   proximal thigh consistent with soft tissue infection such as cellulitis.   No drainable fluid collections or soft tissue gas.      2.Incomplete characterization of a pelvic mass. Recommend further   evaluation with follow-up pelvic ultrasound.      These results /recommendations were discussed with and acknowledged by   Everette Rank, MD on 12/11/2021 8:21 AM.      Emeline Darling, MD   12/11/2021 8:22 AM          Treatment Team:   Attending Provider: Manning Charity, MD               Progress Note/Physical Exam at  Discharge     Subjective: Patient has not had a BM for several days. Pain at abscess is improving. No nausea.     Vitals:    12/14/21 2345 12/15/21 0340 12/15/21 1245 12/15/21 1629   BP: 132/86 138/84 (!) 148/93 134/85   Pulse: 69 78 77 69   Resp:  16 15 16    Temp:  98.2 F (36.8 C) 98.2 F (36.8 C) 99.1 F (37.3 C) 98.4 F (36.9 C)   TempSrc: Oral Oral Oral Oral   SpO2: 100% 100% 100% 100%   Weight:       Height:           General: NAD, Alert  HEENT: sclera anicteric, OP: MMM  Neck: supple, no masses  Cardiovascular: RRR, no m/r/g  Lungs: CTAB, no w/r/r  Abdomen: distended, +BS, NT/ND, no masses, no g/r  Extremities: no clubbing, cyanosis, or edema  Skin: I&D site covered by band aid. No erythema or fluctuance or induration of surrounding area noted  Neuro: no tremor; No Focal neurological deficits       Diagnostics     Labs/Studies Pending at Discharge: Yes    Last Labs   Recent Labs   Lab 12/14/21  0636 12/13/21  0521 12/12/21  0551   WBC 14.55* 14.47* 15.55*   RBC 4.77 4.54 4.62   Hgb 11.4 10.8* 11.1*   Hematocrit 35.2 33.6* 35.2   MCV 73.8* 74.0* 76.2*   Platelets 411* 318 265       Recent Labs   Lab 12/14/21  0636 12/13/21  0521 12/11/21  0541 12/10/21  0821   Sodium 140 138 136 136   Potassium 3.7 3.7 3.6 3.9   Chloride 106 107 106 105   CO2 24 21 22 23    BUN 5.0* 5.0* 8.0 10.0   Creatinine 0.6 0.7 0.8 0.8   Glucose 112* 98 106* 101*   Calcium 8.8 8.4* 8.6 9.0       Microbiology Results (last 15 days)       Procedure Component Value Units Date/Time    Culture + Gram Stain,Aerobic, Wound [[id number]] Collected: 12/13/21 1616    Order Status: Completed Specimen: Wound from Cellulitis Updated: 12/14/21 1654    Narrative:      ORDER#: [id number]                                    ORDERED BY: Derryl Harbor  SOURCE: Cellulitis Right inner thigh                 COLLECTED:  12/13/21 16:16  ANTIBIOTICS AT COLL.:                                RECEIVED :  12/13/21 19:55  Stain, Gram                                FINAL       12/13/21 23:26   +  12/13/21   Many WBCs             No Squamous epithelial cells seen             Moderate Gram positive cocci  Culture and Gram Stain, Aerobic, Wound     PRELIM      12/14/21 16:54  12/14/21   Culture requires further incubation, results  to follow      MRSA culture [[id number]] Collected: 12/12/21 1049    Order Status: Completed Specimen: Culturette from Nasal Swab Updated: 12/13/21 1407     Culture MRSA Surveillance Negative for Methicillin Resistant Staph aureus    MRSA culture [[id number]] Collected: 12/12/21 1049    Order Status: Completed Specimen: Culturette from Throat Updated: 12/13/21 1407     Culture MRSA Surveillance Negative for Methicillin Resistant Staph aureus    COVID-19 (SARS-CoV-2) only (Liat Rapid) asymptomatic admission [[id number]] Collected: 12/11/21 0744    Order Status: Completed Specimen: Nasopharyngeal Updated: 12/11/21 0811     Purpose of COVID testing Screening     SARS-CoV-2 Specimen Source Nasal Swab     SARS CoV 2 Overall Result Not Detected     Comment: __________________________________________________  -A result of "Detected" indicates POSITIVE for the    presence of SARS CoV-2 RNA  -A result of "Not Detected" indicates NEGATIVE for the    presence of SARS CoV-2 RNA  __________________________________________________________  Test performed using the Roche cobas Liat SARS-CoV-2 assay. This assay is  only for use under the Food and Drug Administration's Emergency Use  Authorization. This is a real-time RT-PCR assay for the qualitative  detection of SARS-CoV-2 RNA. Viral nucleic acids may persist in vivo,  independent of viability. Detection of viral nucleic acid does not imply the  presence of infectious virus, or that virus nucleic acid is the cause of  clinical symptoms. Negative results do not preclude SARS-CoV-2 infection and  should not be used as the sole basis for diagnosis, treatment or other  patient management decisions. Negative results must be combined with  clinical observations, patient history, and/or epidemiological information.  Invalid results may be due to inhibiting substances in the specimen and  recollection should occur. Please see Fact Sheets for patients and  providers  located:  WirelessDSLBlog.no         Narrative:      o Collect and clearly label specimen type:  o PREFERRED-Upper respiratory specimen: One Nasal Swab in  Transport Media.  o Hand deliver to laboratory ASAP  Indication for testing->Extended care facility admission to  semi private room  Screening    Culture Blood Aerobic and Anaerobic [[id number]] Collected: 12/11/21 0600    Order Status: Completed Specimen: Arm from Blood, Venipuncture Updated: 12/15/21 1421    Narrative:      ORDER#: [id number]                                    ORDERED BY: Darcel Bayley, LISA  SOURCE: Blood, Venipuncture arm                      COLLECTED:  12/11/21 06:00  ANTIBIOTICS AT COLL.:                                RECEIVED :  12/11/21 13:47  Culture Blood Aerobic and Anaerobic        PRELIM      12/15/21 14:21  12/12/21   No Growth after 1 day/s of incubation.  12/13/21   No Growth after 2 day/s of incubation.  12/14/21   No Growth after 3 day/s of incubation.  12/15/21   No Growth after 4 day/s of incubation.      Culture  Blood Aerobic and Anaerobic [[id number]] Collected: 12/11/21 0600    Order Status: Completed Specimen: Arm from Blood, Venipuncture Updated: 12/15/21 1421    Narrative:      ORDER#: [id number]                                    ORDERED BY: Darcel Bayley, LISA  SOURCE: Blood, Venipuncture arm                      COLLECTED:  12/11/21 06:00  ANTIBIOTICS AT COLL.:                                RECEIVED :  12/11/21 13:47  Culture Blood Aerobic and Anaerobic        PRELIM      12/15/21 14:21  12/12/21   No Growth after 1 day/s of incubation.  12/13/21   No Growth after 2 day/s of incubation.  12/14/21   No Growth after 3 day/s of incubation.  12/15/21   No Growth after 4 day/s of incubation.               Patient Instructions   Discharge Diet: regular diet  Discharge Activity:  activity as tolerated    Follow Up Appointment: PCP   Follow-up Information       Ed Blalock Roosevelt Locks, MD. Schedule an  appointment as soon as possible for a visit.    Specialty: Obstetrics and Gynecology  Contact information:  [address]  [place] [address]  [phone]               Despina Hidden, MD Follow up today.    Specialty: Surgery  Why: No outpatient follow up necessary; if you have questions or concerns in outpatient setting contact information attached.  Contact information:  [address]  [address] [place] [address]  [phone]               Darcel Bayley, MD Follow up.    Specialty: Infectious Disease  Contact information:  [address]  [place] [address]  [phone]                              Time spent examining patient, discussing with patient/family regarding hospital course, chart review, reconciling medications and discharge planning: 45 minutes.    Signed,  Manning Charity, MD  4:31 PM 12/15/2021

## 2021-12-15 NOTE — Progress Notes (Signed)
[facility] [employer] ([facility]viously [facility]):     Received a referral to schedule a follow up appointment with the [facility] [employer].  Appointment scheduled for Tuesday 12/28/21 at 9:40 am with NP Zaiter at [facility] location.      Clinic address is as follows:    [address] [place], [address]    Please notify patient to arrive 15 minutes early to the appointment and bring the following materials with them:    Insurance card (if insured) and photo ID  Medications in their original bottles  Glucometer/blood sugar log (if diabetic)  Weight log (if heart failure)  Proof of income (to enroll in medication assistance programs-first two pages of signed 1040 tax forms or last 2 months of pay stubs)      Dawayne Patricia  Patient Access Associate II  [facility] [employer]   [phone]

## 2021-12-15 NOTE — Progress Notes (Signed)
Infectious disease progress note:    Unasyn #4  Daptomycin #1  Peripheral IV    36 year old female with hypertension and obesity.  Admitted with swelling pain and redness of the right upper thigh.  Status post incision and drainage with gross purulence.  Culture with gram-positive cocci on stain.  There is young growth in culture.    On review of systems:  Overall feeling better.  Main issue was constipation.  She is very uncomfortable.  Failed results with suppository.  No shortness of breath or cough  No nausea or vomiting  Pain in the right groin is much improved.  No rash or itching    BP 138/84   Pulse 78   Temp 98.2 F (36.8 C) (Oral)   Resp 16   Ht 1.676 m (5\' 6" )   Wt 115 kg (253 lb 8.5 oz)   LMP 11/12/2021   SpO2 100%   BMI 40.92 kg/m     No acute distress  Head neck examination is unremarkable  Lungs clear  Heart regular rate and rhythm  Abdomen has decreased bowel sounds.  Soft.  Minimal tenderness.  Right groin looks improved.  The packing was removed.  There is some surrounding induration which is tender.  There is no drainage or purulence to palpation.  Clean dressing was reapplied.  No rash    No new labs this morning    Blood cultures with no growth to date.  Abscess culture with some young growth.    Assessment and plan:    Hypertension, obesity.  Right upper thigh/groin abscess.  Status post I&D.  Cultures are pending.  Hopefully will have further results later today.  Continue current antibiotics for now.    Main issue is her severe constipation.  Magnesium citrate appears to be on 01/10/2022.  Discussed with Dr. Sport and exercise psychologist.  She will write for a fleets enema.    Hope to be able to discharge home on oral antibiotics potentially later today.    Earlyne Iba, M.D.  [facility], [facility]  Pager [phone]  [phone]

## 2021-12-19 ENCOUNTER — Other Ambulatory Visit: Payer: Self-pay | Admitting: Family Medicine

## 2021-12-19 DIAGNOSIS — N809 Endometriosis, unspecified: Secondary | ICD-10-CM

## 2021-12-19 DIAGNOSIS — F411 Generalized anxiety disorder: Secondary | ICD-10-CM

## 2021-12-28 ENCOUNTER — Ambulatory Visit (INDEPENDENT_AMBULATORY_CARE_PROVIDER_SITE_OTHER): Payer: Commercial Managed Care - POS | Admitting: Family

## 2022-02-01 ENCOUNTER — Other Ambulatory Visit: Payer: Self-pay | Admitting: Family Medicine

## 2022-02-01 DIAGNOSIS — I10 Essential (primary) hypertension: Secondary | ICD-10-CM

## 2022-02-15 ENCOUNTER — Other Ambulatory Visit: Payer: Self-pay

## 2022-02-15 ENCOUNTER — Telehealth: Payer: Self-pay | Admitting: Family Medicine

## 2022-02-15 DIAGNOSIS — I10 Essential (primary) hypertension: Secondary | ICD-10-CM

## 2022-02-15 MED ORDER — NIFEDIPINE ER OSMOTIC RELEASE 30 MG PO TB24: 30.0000 mg | ORAL_TABLET | Freq: Every day | ORAL | 0 refills | Status: DC

## 2022-02-15 NOTE — Telephone Encounter (Signed)
Pt requesting refills of  NIFEdipine (PROCARDIA-XL/NIFEDICAL-XL) 30 MG 24 hr tablet and DULoxetine (CYMBALTA) 20 MG capsule  [facility] [place] [place]   phone [phone]. also requesting a prescription fornaproxen sodium (ANAPROX DS) 550 MG tablet  ?

## 2022-02-16 NOTE — Telephone Encounter (Signed)
[place] out of state.  Unable to send controlled medication across state lines.  Patient had refills on Procardia at local pharmacy and was advised to have them  transferred to her current pharmacy. ?

## 2022-02-27 ENCOUNTER — Other Ambulatory Visit: Payer: Self-pay | Admitting: Family Medicine

## 2022-02-27 DIAGNOSIS — N809 Endometriosis, unspecified: Secondary | ICD-10-CM

## 2022-02-27 DIAGNOSIS — F411 Generalized anxiety disorder: Secondary | ICD-10-CM

## 2022-03-04 NOTE — Telephone Encounter (Signed)
Previous message relayed to patient, patient upset that no one called her to inform her that her provider would not be able to fill this prescription.

## 2022-05-06 ENCOUNTER — Other Ambulatory Visit: Payer: Self-pay | Admitting: Family Medicine

## 2022-05-06 DIAGNOSIS — F411 Generalized anxiety disorder: Secondary | ICD-10-CM

## 2022-05-06 DIAGNOSIS — N809 Endometriosis, unspecified: Secondary | ICD-10-CM

## 2022-05-22 ENCOUNTER — Other Ambulatory Visit: Payer: Self-pay | Admitting: Family Medicine

## 2022-05-22 DIAGNOSIS — I10 Essential (primary) hypertension: Secondary | ICD-10-CM

## 2022-06-10 ENCOUNTER — Telehealth: Payer: Self-pay | Admitting: Family Medicine

## 2022-06-10 NOTE — Telephone Encounter (Signed)
Requesting refill DULoxetine (CYMBALTA) 20 MG capsule   [id number] [place] [place] - [id number] [place]:  [phone] Fax:  [phone]

## 2022-06-28 NOTE — Telephone Encounter (Signed)
Pt last seen 11/19/21, was previously given limited refills with need for f/u in 1 month, however she moved [place] of [place].  Unable to refill med.

## 2022-09-07 ENCOUNTER — Telehealth: Payer: Self-pay | Admitting: Vascular Neurology

## 2022-09-07 NOTE — Telephone Encounter (Signed)
Attempted to contact pt to obtain any previous sleep studies or CPAP compliance. Pt did not answer. mychart message sent.

## 2022-09-08 ENCOUNTER — Ambulatory Visit: Payer: Commercial Managed Care - POS | Admitting: Vascular Neurology

## 2022-10-21 ENCOUNTER — Emergency Department: Payer: Commercial Managed Care - POS

## 2022-10-21 ENCOUNTER — Emergency Department
Admission: EM | Admit: 2022-10-21 | Discharge: 2022-10-21 | Disposition: A | Discharge status: Home / Self Care | Payer: Commercial Managed Care - POS | Attending: Emergency Medicine | Admitting: Emergency Medicine

## 2022-10-21 DIAGNOSIS — D259 Leiomyoma of uterus, unspecified: Secondary | ICD-10-CM | POA: Insufficient documentation

## 2022-10-21 DIAGNOSIS — N946 Dysmenorrhea, unspecified: Secondary | ICD-10-CM | POA: Insufficient documentation

## 2022-10-21 DIAGNOSIS — D219 Benign neoplasm of connective and other soft tissue, unspecified: Secondary | ICD-10-CM

## 2022-10-21 HISTORY — DX: Essential (primary) hypertension: I10

## 2022-10-21 LAB — CBC AND DIFFERENTIAL
Absolute NRBC: 0 10*3/uL (ref 0.00–0.00)
Basophils Absolute Automated: 0.03 10*3/uL (ref 0.00–0.08)
Basophils Automated: 0.4 %
Eosinophils Absolute Automated: 0.07 10*3/uL (ref 0.00–0.44)
Eosinophils Automated: 0.9 %
Hematocrit: 41.5 % (ref 34.7–43.7)
Hgb: 13.8 g/dL (ref 11.4–14.8)
Immature Granulocytes Absolute: 0.02 10*3/uL (ref 0.00–0.07)
Immature Granulocytes: 0.3 %
Instrument Absolute Neutrophil Count: 5.43 10*3/uL (ref 1.10–6.33)
Lymphocytes Absolute Automated: 1.82 10*3/uL (ref 0.42–3.22)
Lymphocytes Automated: 23 %
MCH: 24 pg — ABNORMAL LOW (ref 25.1–33.5)
MCHC: 33.3 g/dL (ref 31.5–35.8)
MCV: 72.3 fL — ABNORMAL LOW (ref 78.0–96.0)
MPV: 10.2 fL (ref 8.9–12.5)
Monocytes Absolute Automated: 0.53 10*3/uL (ref 0.21–0.85)
Monocytes: 6.7 %
Neutrophils Absolute: 5.43 10*3/uL (ref 1.10–6.33)
Neutrophils: 68.7 %
Nucleated RBC: 0 /100 WBC (ref 0.0–0.0)
Platelets: 428 10*3/uL — ABNORMAL HIGH (ref 142–346)
RBC: 5.74 10*6/uL — ABNORMAL HIGH (ref 3.90–5.10)
RDW: 19 % — ABNORMAL HIGH (ref 11–15)
WBC: 7.9 10*3/uL (ref 3.10–9.50)

## 2022-10-21 LAB — URINALYSIS WITH REFLEX TO MICROSCOPIC EXAM - REFLEX TO CULTURE
Bilirubin, UA: NEGATIVE
Glucose, UA: NEGATIVE
Nitrite, UA: NEGATIVE
Specific Gravity UA: 1.019 (ref 1.001–1.035)
Urine pH: 6.5 (ref 5.0–8.0)
Urobilinogen, UA: NORMAL mg/dL (ref 0.2–2.0)

## 2022-10-21 LAB — COMPREHENSIVE METABOLIC PANEL
ALT: 13 U/L (ref 0–55)
AST (SGOT): 18 U/L (ref 5–41)
Albumin/Globulin Ratio: 1.1 (ref 0.9–2.2)
Albumin: 4 g/dL (ref 3.5–5.0)
Alkaline Phosphatase: 67 U/L (ref 37–117)
Anion Gap: 11 (ref 5.0–15.0)
BUN: 7 mg/dL (ref 7.0–21.0)
Bilirubin, Total: 0.5 mg/dL (ref 0.2–1.2)
CO2: 21 mEq/L (ref 17–29)
Calcium: 9.4 mg/dL (ref 8.5–10.5)
Chloride: 106 mEq/L (ref 99–111)
Creatinine: 0.9 mg/dL (ref 0.4–1.0)
Globulin: 3.7 g/dL — ABNORMAL HIGH (ref 2.0–3.6)
Glucose: 102 mg/dL — ABNORMAL HIGH (ref 70–100)
Potassium: 3.7 mEq/L (ref 3.5–5.3)
Protein, Total: 7.7 g/dL (ref 6.0–8.3)
Sodium: 138 mEq/L (ref 135–145)
eGFR: >60 mL/min/{1.73_m2} (ref >=60)

## 2022-10-21 LAB — URINE HCG QUALITATIVE: Urine HCG Qualitative: NEGATIVE

## 2022-10-21 MED ORDER — MORPHINE SULFATE 4 MG/ML IJ/IV SOLN (WRAP)
4.0000 mg | Freq: Once | Status: AC
Start: 2022-10-21 — End: 2022-10-21
  Administered 2022-10-21: 4 mg via INTRAVENOUS
  Filled 2022-10-21: qty 1

## 2022-10-21 MED ORDER — KETOROLAC TROMETHAMINE 30 MG/ML IJ SOLN
30.0000 mg | Freq: Once | INTRAMUSCULAR | Status: AC
Start: 2022-10-21 — End: 2022-10-21
  Administered 2022-10-21: 30 mg via INTRAVENOUS
  Filled 2022-10-21: qty 1

## 2022-10-21 MED ORDER — ONDANSETRON HCL 4 MG/2ML IJ SOLN
4.0000 mg | Freq: Once | INTRAMUSCULAR | Status: AC
Start: 2022-10-21 — End: 2022-10-21
  Administered 2022-10-21: 4 mg via INTRAVENOUS
  Filled 2022-10-21: qty 2

## 2022-10-21 NOTE — ED Provider Notes (Signed)
History     Chief Complaint   Patient presents with    Vaginal Bleeding    Pelvic Pain     37 year old female presents today for evaluation of vaginal plane and left-sided pelvic pain.  Patient states she has a history of endometriosis with heavy periods however they normally only last 3 days.  This menses came at the expected time but has been ongoing for 5 days which is atypical.  Pain is severe, stabbing, constant, nonradiating.  She is using 10 pads a day.         Past Medical History:   Diagnosis Date    Anxiety     Depression     Endometriosis     Hypertension        Past Surgical History:   Procedure Laterality Date    LAPAROTOMY, SALPINGO-OOPHERECTOMY Left        History reviewed. No pertinent family history.    Social  Social History     Tobacco Use    Smoking status: Never     Passive exposure: Never    Smokeless tobacco: Never   Vaping Use    Vaping Use: Never used   Substance Use Topics    Alcohol use: Yes    Drug use: Yes     Comment: marijuana       .     No Known Allergies    Home Medications       Med List Status: In Progress Set By: Particia Jasper, RN at 10/21/2022  6:33 AM              amLODIPine (NORVASC) 10 MG tablet          bisacodyl (DULCOLAX) 10 mg suppository     Place 1 suppository (10 mg) rectally daily as needed for Constipation     DULoxetine (CYMBALTA) 20 MG capsule          norethindrone (MICRONOR) 0.35 MG tablet          oxyCODONE (ROXICODONE) 5 MG immediate release tablet     Take 1 tablet (5 mg) by mouth every 6 (six) hours as needed for Pain     polyethylene glycol (MIRALAX) 17 g packet     Take 17 g by mouth daily as needed (constipation)     senna-docusate (PERICOLACE) 8.6-50 MG per tablet     Take 1 tablet by mouth nightly          Flagged for Removal               NIFEdipine XL (PROCARDIA XL) 30 MG 24 hr tablet     Take 1 tablet (30 mg) by mouth daily             Review of Systems   Constitutional:  Negative for chills and fever.   HENT:  Negative for sore throat.     Eyes:  Negative for visual disturbance.   Respiratory:  Negative for cough and shortness of breath.    Cardiovascular:  Negative for chest pain.   Gastrointestinal:  Negative for diarrhea, nausea and vomiting.   Genitourinary:  Positive for pelvic pain and vaginal bleeding. Negative for dysuria, flank pain, hematuria and vaginal discharge.   Musculoskeletal:  Negative for arthralgias and myalgias.   Skin:  Negative for rash.   Allergic/Immunologic: Negative for immunocompromised state.   Neurological:  Negative for dizziness.   Psychiatric/Behavioral:  Negative for suicidal ideas.        Physical Exam  BP: (!) 139/97, Heart Rate: 79, Temp: 98.1 F (36.7 C), Resp Rate: 20, SpO2: 100 %, Weight: 117 kg    Physical Exam  Vitals and nursing note reviewed.   Constitutional:       Appearance: Normal appearance.   HENT:      Head: Normocephalic and atraumatic.      Mouth/Throat:      Mouth: Mucous membranes are moist.   Eyes:      Conjunctiva/sclera: Conjunctivae normal.   Cardiovascular:      Rate and Rhythm: Normal rate and regular rhythm.   Pulmonary:      Breath sounds: Normal breath sounds.   Abdominal:      Palpations: Abdomen is soft.   Genitourinary:     Comments: Pelvic exam chaperoned by [place] RN.  Dark red blood in vaginal vault, os is closed, no lesions, no CMT  Musculoskeletal:         General: No tenderness or deformity.   Skin:     General: Skin is warm and dry.   Neurological:      General: No focal deficit present.      Mental Status: She is alert and oriented to person, place, and time.   Psychiatric:         Mood and Affect: Mood normal.           MDM and ED Course     ED Medication Orders (From admission, onward)      Start Ordered     Status Ordering Provider    10/21/22 [phone] 10/21/22 0916  morphine injection 4 mg  Once        Route: Intravenous  Ordered Dose: 4 mg       Last MAR action: Given Orval Dortch A    10/21/22 0741 10/21/22 0740  morphine injection 4 mg  Once        Route:  Intravenous  Ordered Dose: 4 mg       Last MAR action: Given Joyann Spidle A    10/21/22 0701 10/21/22 0700  ketorolac (TORADOL) injection 30 mg  Once        Route: Intravenous  Ordered Dose: 30 mg       Last MAR action: Given Samarra Ridgely A    10/21/22 0701 10/21/22 0700  ondansetron (ZOFRAN) injection 4 mg  Once        Route: Intravenous  Ordered Dose: 4 mg       Last MAR action: Given Tajon Moring A               Medical Decision Making  37 year old female with a history of endometriosis and fibroid uterus who presents today for evaluation of vaginal bleeding and pain.  Ultrasound confirms fibroid uterus but no acute findings.  Suspect heavy menses secondary to fibroids and that dysmenorrhea is secondary to endometriosis.  Patient is new to the area from [place], I will make a referral to GYN surgery for further evaluation and management.  Patient pain controlled in the ED, recommend that she start taking her in 800 mg ibuprofen 3 times daily for the duration of her.  To help reduce bleeding and cramping.  Discharge home.  Return precautions discussed all questions answered.    Amount and/or Complexity of Data Reviewed  Labs: ordered.  Radiology: ordered.    Risk  Prescription drug management.                     Procedures  Clinical Impression & Disposition     Clinical Impression  Final diagnoses:   Dysmenorrhea   Fibroid        ED Disposition       ED Disposition   Discharge    Condition   --    Date/Time   Fri Oct 21, 2022  9:48 AM    Comment   Hollace Hayward discharge to home/self care.    Condition at disposition: Stable                  New Prescriptions    No medications on file                   Mariana Arn, MD  10/21/22 [phone]

## 2022-10-27 ENCOUNTER — Encounter (INDEPENDENT_AMBULATORY_CARE_PROVIDER_SITE_OTHER): Payer: Self-pay | Admitting: Obstetrics and Gynecology

## 2022-10-27 ENCOUNTER — Ambulatory Visit (INDEPENDENT_AMBULATORY_CARE_PROVIDER_SITE_OTHER): Payer: Commercial Managed Care - POS | Admitting: Obstetrics and Gynecology

## 2022-10-27 VITALS — BP 126/85 | HR 71 | Temp 98.2°F | Ht 67.0 in | Wt 260.4 lb

## 2022-10-27 DIAGNOSIS — D252 Subserosal leiomyoma of uterus: Secondary | ICD-10-CM

## 2022-10-27 DIAGNOSIS — N809 Endometriosis, unspecified: Secondary | ICD-10-CM

## 2022-10-27 MED ORDER — CYCLOBENZAPRINE HCL 5 MG PO TABS
5.0000 mg | ORAL_TABLET | Freq: Every evening | ORAL | 1 refills | Status: DC
Start: 2022-10-27 — End: 2022-12-22

## 2022-10-27 MED ORDER — NORETHINDRONE ACETATE 5 MG PO TABS
5.0000 mg | ORAL_TABLET | Freq: Every day | ORAL | 1 refills | Status: DC
Start: 2022-10-27 — End: 2022-11-23

## 2022-10-27 NOTE — Addendum Note (Signed)
Addended by: Glyn Ade on: 10/27/2022 01:17 PM     Modules accepted: Orders

## 2022-10-27 NOTE — Progress Notes (Signed)
[facility]       Patient Name: Linda [name]  Attending Physician: No att. providers found    CC: endometriosis and fibroids      History of Presenting Illness:   Linda [name] is a 37 y.o. female  [id number] who presents to the office with dysmenorrhea and heavy cycles. She has always had heavy cycles, often wearing a tampon and pad at the same time, changing her pad every 2 hours. She went to  the ED with painful menses that felt like a stabbing pain in the lower pelvis. She reports debilitating cycles that interfere with quality of life. Also has painful intercourse on deep penetration.   Patient told she had stage 4 endometriosis at last surgery.      H/H: 13.8/41.5  CMP unremarkable  Hcg negative  Urine culture negative.     Previous treatments  OCP's and Nuvaring - has HTN  micronor    Hx of ectopic pregnancy - medical management.   S/p laparoscopy and right oophorectomy for endometriosis. Had subsequent lap myomectomy with mini lap and ablation of endometriosis  Past Medical History:     Past Medical History:   Diagnosis Date    Anxiety     Depression     Endometriosis     Hypertension        Past Surgical History:     Past Surgical History:   Procedure Laterality Date    LAPAROTOMY, SALPINGO-OOPHERECTOMY Left        OB History:     No LMP recorded.  OB History    No obstetric history on file.         Medications      Current Outpatient Medications:     amLODIPine (NORVASC) 10 MG tablet, , Disp: , Rfl:     bisacodyl (DULCOLAX) 10 mg suppository, Place 1 suppository (10 mg) rectally daily as needed for Constipation, Disp: 10 suppository, Rfl: 0    DULoxetine (CYMBALTA) 20 MG capsule, , Disp: , Rfl:     NIFEdipine XL (PROCARDIA XL) 30 MG 24 hr tablet, Take 1 tablet (30 mg) by mouth daily, Disp: , Rfl:     norethindrone (MICRONOR) 0.35 MG tablet, , Disp: , Rfl:     oxyCODONE (ROXICODONE) 5 MG immediate release tablet, Take 1 tablet (5 mg) by mouth every 6 (six) hours as needed for Pain, Disp: 8  tablet, Rfl: 0    polyethylene glycol (MIRALAX) 17 g packet, Take 17 g by mouth daily as needed (constipation), Disp: 30 each, Rfl: 0    senna-docusate (PERICOLACE) 8.6-50 MG per tablet, Take 1 tablet by mouth nightly, Disp: 30 tablet, Rfl: 0   Allergies:     No Known Allergies    Psychosocial / Family History:       No family history on file.    Review of Systems:     Per HPI    Physical Exam:   BP 126/85   Pulse 71   Temp 98.2 F (36.8 C)   Ht 1.702 m (5\' 7" )   Wt 118.1 kg (260 lb 5.8 oz)   LMP 10/17/2022 (Exact Date)   BMI 40.78 kg/m     Gen: NAD, AAOx3  GU; defer to next visit.     10/21/2022  US Findings: Uterus measures 12.8 x 6.2 x 9.8 cm. Multiple uterine fibroids  are again seen. Left posterior pedunculated fibroid measures 8.0 x 8.0 x  4.4 cm. Anterior subserosal fibroid measures 4.9 x 4.2 x  4.0 cm. Posterior  intramural fibroid in the uterine fundus measures 3.4 x 3.0 x 2.9 cm..  Endometrial lining measures 1 mm in thickness on transabdominal images but  is not adequately visualized by transvaginal technique due to shadowing  from fibroids.     Right ovary measures 3.5 x 2.5 x 2.6 cm and is unremarkable in appearance.  Left ovary is not visualized. Normal arterial and venous blood flow in the  right ovary on color flow and spectral Doppler.     No free pelvic fluid.      IMPRESSION:      1. Enlarged fibroid uterus.  2. Unremarkable. Right ovary. Nonvisualization of the left ovary compatible  with provided history of left oophorectomy    Assessment:   37 y/o with heavy cycles and dysmenorrhea with fibroids and endometriosis. Patient went to ED last week in pain. Currently on Micronor.     Plan:     We discussed medical management of bleeding and pain: dicussed alternative progesterone methods including injectables, nexplanon and IUD. Discussed starting aygestin 5 mg daily to control bleeding and pain while undergoing evaluation. We also discussed lysteda and flexeril.     We discussed surgical  management of fibroids and endometriosis. Patient most interested in fibroid removal and resection of endo. I recommended MRI for preop planning given possible history and symptoms concerning for stage 4 endometriosis and for fibroid mapping.     Patient agreeable to try 5 mg of aygestin and flexeril for pain.     I spent 40 minutes of face-to-face time with the patient on history and exam, discussion of prior testing, and counseling/education as in the above assessment documentation, as well as an additional 20 minutes of non-face-to-face time on the same day as the encounter on visit preparation, documentation, and test ordering to care for this new patient, excluding any procedures performed.        Signed by: Lindell Noe, MD

## 2022-11-01 ENCOUNTER — Encounter (INDEPENDENT_AMBULATORY_CARE_PROVIDER_SITE_OTHER): Payer: Self-pay

## 2022-11-01 ENCOUNTER — Other Ambulatory Visit (INDEPENDENT_AMBULATORY_CARE_PROVIDER_SITE_OTHER): Payer: Self-pay | Admitting: Obstetrics and Gynecology

## 2022-11-01 DIAGNOSIS — N809 Endometriosis, unspecified: Secondary | ICD-10-CM | POA: Insufficient documentation

## 2022-11-01 DIAGNOSIS — D259 Leiomyoma of uterus, unspecified: Secondary | ICD-10-CM | POA: Insufficient documentation

## 2022-11-10 ENCOUNTER — Ambulatory Visit
Admission: RE | Admit: 2022-11-10 | Discharge: 2022-11-10 | Disposition: A | Discharge status: Home / Self Care | Payer: Commercial Managed Care - POS | Source: Ambulatory Visit | Attending: Obstetrics and Gynecology | Admitting: Obstetrics and Gynecology

## 2022-11-10 DIAGNOSIS — N809 Endometriosis, unspecified: Secondary | ICD-10-CM | POA: Insufficient documentation

## 2022-11-10 MED ORDER — GADOTERATE MEGLUMINE 10 MMOL/20ML IV SOLN (CLARISCAN)
0.2000 mL/kg | Freq: Once | INTRAVENOUS | Status: AC | PRN
Start: 2022-11-10 — End: 2022-11-10
  Administered 2022-11-10: 3.6 mL via INTRAVENOUS

## 2022-11-10 MED ORDER — GADOTERATE MEGLUMINE 2.5 MMOL/5ML IV SOLN (CLARISCAN)
0.2000 mL/kg | Freq: Once | INTRAVENOUS | Status: AC | PRN
Start: 2022-11-10 — End: 2022-11-10
  Administered 2022-11-10: 20 mL via INTRAVENOUS

## 2022-11-17 ENCOUNTER — Encounter (INDEPENDENT_AMBULATORY_CARE_PROVIDER_SITE_OTHER): Payer: Self-pay | Admitting: Obstetrics and Gynecology

## 2022-11-17 ENCOUNTER — Ambulatory Visit (INDEPENDENT_AMBULATORY_CARE_PROVIDER_SITE_OTHER): Payer: Commercial Managed Care - POS | Admitting: Obstetrics and Gynecology

## 2022-11-17 VITALS — BP 124/91 | HR 79 | Temp 98.2°F | Ht 67.0 in | Wt 258.8 lb

## 2022-11-17 DIAGNOSIS — D251 Intramural leiomyoma of uterus: Secondary | ICD-10-CM

## 2022-11-17 DIAGNOSIS — K660 Peritoneal adhesions (postprocedural) (postinfection): Secondary | ICD-10-CM

## 2022-11-17 DIAGNOSIS — D252 Subserosal leiomyoma of uterus: Secondary | ICD-10-CM

## 2022-11-17 DIAGNOSIS — N809 Endometriosis, unspecified: Secondary | ICD-10-CM

## 2022-11-17 DIAGNOSIS — D250 Submucous leiomyoma of uterus: Secondary | ICD-10-CM

## 2022-11-17 NOTE — Progress Notes (Signed)
[place]       Patient Name: Linda [name]  Attending Physician: No att. providers found    CC: MRI review     History of Presenting Illness:   Linda [name] is a 37 y.o. female who presents to the office for MRI review    Past Medical History:     Past Medical History:   Diagnosis Date    Anxiety     Depression     Endometriosis     Hypertension        Past Surgical History:     Past Surgical History:   Procedure Laterality Date    LAPAROSCOPIC, EXCISION, ENDOMETRIOSIS      2013, 2015    LAPAROSCOPIC, SALPINGO-OOPHORECTOMY Left 2013    MYOMECTOMY  2016       OB History:     Patient's last menstrual period was 10/17/2022 (exact date).  OB History       Gravida   1    Para        Term        Preterm        AB   1    Living             SAB        IAB        Ectopic   1    Multiple        Live Births                     Medications      Current Outpatient Medications:     amLODIPine (NORVASC) 10 MG tablet, , Disp: , Rfl:     bisacodyl (DULCOLAX) 10 mg suppository, Place 1 suppository (10 mg) rectally daily as needed for Constipation (Patient not taking: Reported on 10/27/2022), Disp: 10 suppository, Rfl: 0    cyclobenzaprine (FLEXERIL) 5 MG tablet, Take 1 tablet (5 mg) by mouth nightly May cut tablet in half if too sedating., Disp: 30 tablet, Rfl: 1    DULoxetine (CYMBALTA) 20 MG capsule, , Disp: , Rfl:     Naproxen Sodium (ALEVE PO), Take by mouth, Disp: , Rfl:     NIFEdipine XL (PROCARDIA XL) 30 MG 24 hr tablet, Take 1 tablet (30 mg) by mouth daily (Patient not taking: Reported on 10/27/2022), Disp: , Rfl:     norethindrone (AYGESTIN) 5 MG tablet, Take 1 tablet (5 mg) by mouth daily, Disp: 30 tablet, Rfl: 1    oxyCODONE (ROXICODONE) 5 MG immediate release tablet, Take 1 tablet (5 mg) by mouth every 6 (six) hours as needed for Pain (Patient not taking: Reported on 10/27/2022), Disp: 8 tablet, Rfl: 0    polyethylene glycol (MIRALAX) 17 g packet, Take 17 g by mouth daily as needed (constipation)  (Patient not taking: Reported on 10/27/2022), Disp: 30 each, Rfl: 0    senna-docusate (PERICOLACE) 8.6-50 MG per tablet, Take 1 tablet by mouth nightly (Patient not taking: Reported on 10/27/2022), Disp: 30 tablet, Rfl: 0   Allergies:     No Known Allergies    Psychosocial / Family History:       Family History   Problem Relation Age of Onset    Heart disease Father     Breast cancer Mother     Breast cancer Maternal Aunt     Breast cancer Maternal Aunt        Review of Systems:     Per HPI  Physical Exam:   There were no vitals filed for this visit.    Gen: NAD, AAOx3  Abdomen: Soft, NT, ND, normal BS, well healed scar  Ext: No C/C/E  External Genitalia: no lesions  Vagina: Normal  Cervix: Normal  Uterus: 18 week size uterus  Adnexa: No mass or tenderness    Assessment:         Plan:     We discussed that given the MRI findings, patient would most benefit from hysterectomy. The patient is open to this, but still would prefer fertility sparing procedure. For this reason, I believe that we should pursue removing as many fibroids as possible. On MRI - submucosal fibroids and innumerous fibroids were identified. I recommended a lap assisted open myomectomy to avoid using a large vertical incision, and to optimize our chances of getting all the fibroids, as well as hysteroscopic resection. We discussed that the case will take longer than anticipated based on the MRI results, and that alternative date may be needed to accommodate this change in surgical planning. Patient verbalizes understanding.     We discussed risks of surgery including bleeding, infection, damage to internal organs, hysterectomy, recurrence, need  for additional procedures, blood transfusion, anesthesia risks such as blood clots that can be life-threatening.     The surgical indication, anticipated course, risks and alternatives to the proposed surgery, including the consequences of not having the surgery have been explained to the patient /  guardian. Good understanding of these considerations has been demonstrated and she wishes to proceed.      Billing based on medical complexity.     Signed by: Lindell Noe, MD

## 2022-11-18 ENCOUNTER — Other Ambulatory Visit (INDEPENDENT_AMBULATORY_CARE_PROVIDER_SITE_OTHER): Payer: Self-pay | Admitting: Obstetrics and Gynecology

## 2022-11-22 NOTE — Progress Notes (Signed)
Referral Self  no address    CC: endometriosis    37 y.o. year old G16P0 (C/S:0, SVD:0, Ectopic:1 - MTX - left side despite LSO) who present for evaluation of chronic pelvic pain endometriosis infertility, fibroids    Second opinion - Dr. Herbie Baltimore Jan  Chart extraction  dysmenorrhea and heavy cycles. She has always had heavy cycles, often wearing a tampon and pad at the same time, changing her pad every 2 hours. She went to the ED with painful menses that felt like a stabbing pain in the lower pelvis. She reports debilitating cycles that interfere with quality of life. Also has painful intercourse on deep penetration.   Patient told she had stage 4 endometriosis at last surgery.     Onset of symptoms was 10 years ago and since that time has gradually worsened. The pain occurs throughout the month but worse with menses. It is located in the RLQ, LLQ, pelvic, and lower back and lasts forever (never resolves), but intermittently fluctuates. She experiences the pain 30/30 months in the month. She describes the pain as sharp, dull, stabbing, and aching. Symptoms improve with none.She report dyspareunia dysmenorrhea dyschezia. The pain has the following effect on her personal life:has missed work or school or unable to perform well, been to ED and required surgery.  In the past, she has undergone medical management, including OCP surgery: laparoscopy- endo fulguration.  Urinar symptoms with pressure and incontinence,   Bleeding with sex and intermittently throughout the month.   Vaginal pains     Previous treatments  OCP's and Nuvaring - has HTN  Micronor    The patient's menses are regular without intermenstrual bleeding. They last 5 days. They are overall heavy with clots. She changes pads/tampons every 2-3 hours    Endometrial Biopsy: -    Previous surgery:    Hx of ectopic pregnancy - medical management.   S/p laparoscopy and right oophorectomy for endometriosis. Had subsequent lap myomectomy with mini lap and  ablation of endometriosis     [facility] - 2016 - April Manson, Greer Ee, MD   Right ovarian 5 cm endometrioma, stage IV endometriosis of the right ovary, right tube, posterior cul-de-sac and uterus, uterine leiomyomas, intramural and subserosal, pelvic adhesions     On laparoscopy, upper abdomen, liver surface and diaphragm surfaces were normal. Gallbladder was normal. The appendix was normal. The pelvic peritoneum looked mostly normal.   Anterior cul-de-sac peritoneum contained a red blebs of endometriosis on the left side as well as brown lesions, this area was excised. There were anterior uterine intramural and subserosal myomas about 1 cm. There was a fundal 1 x 1 cm myoma. There were fundal and posterior transmural myoma that measures 3 x 2 cm.  A partial obliteration of the posterior cul-de-sac was noted, with attachment of the rectosigmoid to the site where a previous left salpingo-oophorectomy had been performed. After lysis of these adhesions, we found filmy adhesions covering the cul-de-sac. Underneath was a 4 x 4 centimeter pedunculated myoma that was wedged into the posterior cul-de-sac. The right ovary enlarged with a 5 x 5 cm endometrioma as well as a corpus luteum overlying its. The ovary was adherent with 30% of its surface area to the posterior aspect of the uterus and to the right tube. The right tube had filmy adhesions covering 50% of its distal portion, even though the fimbria could be separated from these adhesions and were rated as 5 out of 5. The right tube was patent to chromotubation.  There were no other peritoneal lesions of endometriosis in the posterior cul-de-sac.-     Latest Imaging: [facility]  10/21/2022  US Findings: Uterus measures 12.8 x 6.2 x 9.8 cm. Multiple uterine fibroids  are again seen. Left posterior pedunculated fibroid measures 8.0 x 8.0 x  4.4 cm. Anterior subserosal fibroid measures 4.9 x 4.2 x 4.0 cm. Posterior  intramural fibroid in the uterine fundus measures 3.4 x 3.0 x  2.9 cm..  Endometrial lining measures 1 mm in thickness on transabdominal images but  is not adequately visualized by transvaginal technique due to shadowing  from fibroids.    Right ovary measures 3.5 x 2.5 x 2.6 cm and is unremarkable in appearance.  Left ovary is not visualized. Normal arterial and venous blood flow in the  right ovary on color flow and spectral Doppler.    No free pelvic fluid.      MRI:  FINDINGS:   The uterus is enlarged. Uterus measures 11 cm longitudinal by 8 cm AP by   9.2 cm transverse.       There are innumerable confluent uterine fibroids in intramural, subserosal   and submucosal locations.     For measurement purposes dominant fibroid is an exophytic posterior   subserosal midline uterine body fibroid measuring 7.6 x 7.2 x 7.2 cm   extending into the presacral space. There is also a left anterior   paramedian transmural lower uterine segment 3.5 x 3.4 x 3.5 cm fibroid.   There is a right posterior paramedian bilobed transmural 3.9 x 3.8 x 3.8 cm   fibroid.     Scattered smaller fibroids are seen throughout the myometrium     There is resultant marked distortion and displacement of the endometrium by   the multiple uterine fibroids. A frank intracavitary fibroid is not seen     There is fairly extensive subendometrial cystic change and amorphous   thickening of the junctional zone in the upper uterine fundus extending   posteriorly. Image 34 series 3. Findings are compatible with fairly   pronounced fundal adenomyosis.     There is no endometrial stripe thickening or endometrial polyps seen     Urinary bladder is somewhat compressed along its posterior dome by the   enlarged uterus. No bladder filling defects are seen     No suspicious adnexal masses are seen. Patient is reportedly status post   left colectomy. Right ovary appears unremarkable     Cervix and urethra and vaginal canal appear unremarkable. Fibroids   demonstrate fairly pronounced if slightly heterogeneous enhancement  after   contrast     Sagittal fat saturated T1-weighted images demonstrate several focal regions   of hemorrhagic fluid/soft tissue along the posterior margin of the uterine,   fundus, body and lower uterine segment. For example image 53 series 9. Also   image 37 series 9. These findings are are not completely specific but would   be suspicious for focal regions of deep infiltrative endometriosis in the   setting of a patient with reported history of endometriosis. No focal   endometrioma is seen. [name] simple fluid is seen in the cul-de-sac     MRI reviewed by me  Patient has colonic adhesions to the posterior aspect of the uterus on the right uterosacral ligament with a displacing pedunculated fibroid that is entrapped between the cul-de-sac and the sacrum.  There is a pedunculated fibroid.  She has innumerable fibroids throughout the entire uterus that are completely displacing the uterus, there  are at least 10+ fibroids of varying sizes.  She also has diffuse adenomyosis.    Past Gynecologic History:   Abnormal Pap Smears: PAP normal & cervical biopsy normal 09/2022   History of STD: No    ROS: Reviewed and negative x10 systems other than HPI    Past Medical History:   Diagnosis Date   Anxiety   Depression   Endometriosis   Hypertension     Past Surgical History:     Past Surgical History:   Procedure Laterality Date   LAPAROSCOPIC, EXCISION, ENDOMETRIOSIS   2013, 2015   LAPAROSCOPIC, SALPINGO-OOPHORECTOMY Left 2013   MYOMECTOMY 2016     Medications:   Current Outpatient Medications on File Prior to Visit   Medication Sig Dispense Refill   . amLODIPine (NORVASC) 10 MG tablet      . cyclobenzaprine (FLEXERIL) 5 MG tablet Take 5 mg by mouth.     . ergocalciferol (DRISDOL) 1,250 mcg (50,000 unit) capsule TAKE 1 CAPSULE EVERY WEEK BY ORAL ROUTE.     . DULoxetine (IRENKA) 40 mg Capsule, Delayed Release(E.C.) Take 1 tablet by mouth daily.     . naproxen sodium (ALEVE) 220 mg Capsule Take by mouth.       No current  facility-administered medications on file prior to visit.       Allergies: No Known Allergies     Social History:  Occupation: Child psychotherapist        Family History:  No family history on file.    Physical Exam: BP (!) 147/102   Wt 116.1 kg (256 lb)   GENERAL: WDWN/NAD  Psych: Normal orientation, mood and affect  CV: Normal peripheral perfusion without external edema, RRR  Pulm: Respiratory effort normal, CTAB  Skin: No rashes, lesions, or ulcers  Lymph: No groin LAD  Abdomen: Laparoscopic scars Transverse abdominal scar  Musculoskeletal: Normal  Pelvic:   External Genitalia: Vulvar: No lesions, normal  Speculum exam: Cervix deviated to the left  Pelvic muscles: No spasm   Bimanual Exam:  Vagina: cul-de-sac tenderness Retrocervical tenderness Retrovaginal nodularity left USL nodularity, possible rectal involvement  Uterus: Fundal tenderness enlarged to 14-16 wk, very limited mobility  Adnexa: indistinct  RVE: Deferred  Urethral Meatus: Normal  Urethra: Normal  Bladder: No masses or tenderness    A chaperone (Ms. Peace) was present during all portions of the pelvic and/or breast exam.     Pelvic Pain Assessment and Plan:  Linda [name] is a 37 y.o. G2P0. presenting for chronic pelvic pain endometriosis adenomyosis, fibroids .     Get [facility] testing and let me know if she would like to proceed with surgery with me or with her other provider.  I think what makes the most sense is for her to have a laparoscopic myomectomy of the larger pedunculated fibroid and excision of endometriosis.  Also reviewed bowel endometriosis that she most likely has based on her exam but is not very symptomatic so could be delayed in terms of interventions to minimize risk of complications..  Reviewed that the rest of her fibroids and adenomyosis can be managed either conservatively or with another mini lap procedure, however reviewed that in light of her low AMH level that was previously already diagnosed at I would favor not damaging the  uterus if it manage delay of fertility..  Think most likely that should be in a staged manner rather than at the same time but it depends on what the intraoperative findings are could be potentially done  at the same time if easily accessible but if it is a prolonged surgery due to the endometriosis and what delay this may mean.    I think overall I reviewed with the patient that in light of her severe endometriosis she really needs to make a decision in regards to fertility and how actively she is going to pursue this.  I would recommend that she see a fertility provider again to have a detailed discussion regards to egg freezing.  If she is not a candidate for egg freezing and does not willing to use a donor egg it really does not make a lot of sense for her to try and preserve her uterus given that she has tried factor of pain and etiologies with adenomyosis fibroids and endometriosis.  I think without undergoing complete surgical management she would likely have only partial relief of her symptoms.  She may be okay with this if she is trying to still preserve fertility but I think a detailed workup should be done in regards to what is possible and realistic.  I think this should ideally be done prior to surgery given that it makes no sense to proceed with a half surgery that does not give her full relief of her symptoms if fertility is not an option.  At this very frank discussion with the patient.  She will think about her options.    Based on the patient's history/physical exam, the patient likely have a component of endometriosis.   Reviewed the diagnosis of endometriosis. Reviewed endometriosis treatment options with her including no therapy, conservative medical therapy with hormonal regulatory medication, or surgical intervention in a fertility-sparing or non-fertility sparing manner. We did review that surgery has greater risks than ongoing medical therapy but can be more effective, especially if medical  therapies are not controlling pain sufficiently. We reviewed that surgery may not relief all the pain or any of the pain if not due to endometriosis and that there are no guarantees. We reviewed the recurrence risk associated with endometriosis surgical treatment. If the patient has a uterus, we reviewed that a hysterectomy is generally not required for fertility-sparing endometriosis management, but reviewed with the patient that in some cases endometriosis may not be able to removed safely due to extensive scarring or the location near the uterine vessels without performing a concurrent hysterectomy. We reviewed similarly that in some cases that endometriosis may not be removed by excision but may require fulguration such as superficial endometriosis on the ovaries without evidence of an endometrioma or on the fallopian tubes without compromising potential fertility. If the patient was done with child-bearing, I reviewed that I would generally recommend concurrent hysterectomy in order to also help improve uterine pain related to dysmenorrhea that may be a separate disease process altogether.    If adnexal involvement of endometriosis was suspected based on imaging such as endometriosis or other known advanced endometriotic disease based on symptoms or previous procedures was suspected, we reviewed that management may involve the need for a unilateral salpingo-oophorectomy in cases when a cystectomy cannot be performed or significant scarring is encountered that does not allow for ovarian conservation for endometriosis treatment. Reviewed that if fertility is desired the plan would always be to remove as much endometriosis as possible but at least retain one ovary and one fallopian tube. In these cases, we did review that in those cases we would make an intraoperative decision. In patient who do not desire fertility, we reviewed that similarly, the goal  would be to maintain one functional ovary, but that if  disease is too severe or high suspicion of likely needing repeat surgery in the near future if both ovaries were not removed, then would recommend BSO followed by HRT.     If the patient is of advanced maternal age or has had multiple ovarian surgeries for endometriosis in the past, we reviewed that ovarian surgery can increase the risk of affecting ovarian reserve as well as menopause. Reviewed that while generally a USO does not significantly increase the risk of early menopause if the other ovary is normal in appearance, the endometriotic disease process can damage the ovary as well as repeat surgeries on the ovary. Especially in cases of ovarian endometriomas this can be significant and can impact fertility as well as increase the risk of menopause.     If the patient has had difficulty conceiving spontaneously, we reviewed the impact of endometriosis on fertility with likely multifactorial components contributing depending on the patient's disease process. We reviewed that there is good data that supports that surgical management of endometriosis can improve fertility in early stage endometriosis. Reviewed that in more advanced stages of endometriosis, surgical management similarly can be useful, especially if the patient is unable to afford IVF or has not had successful IVF cycles. Reviewed that if the patient has ovarian involvement of endometriosis, especially in the form of an ovarian endometrioma that surgery can decrease ovarian reserve. However, reviewed that while this has been shown to reduce the amount of eggs that can be retrieved during subsequent IVF cycles, this has not actually translated into a lower birth rate. Reviewed that if unable to afford IVF, that aggressive surgical management of endometriosis has been shown to help improve fertility. If the patient has evidence of hydrosalpinx we also reviewed management of this, especially in regards to improving IVF rates, or in cases that the  patient does not desire removal reviewed that may require a second surgery if she will end up needing to undergo IVF in order to remove these.     Based on the patient's history/physical exam, the patient likely have a component of adenomyosis.   Reviewed the diagnosis of adenomyosis with the patient. Discussed with the patient that while adenomyosis can be seen on a MRI with high specificity, it only has a 50% sensitivity. Hence the diagnosis of adenomyosis is generally that of a clinical diagnosis based on risk factors, history and physical exam findings (specifically fundal tenderness on uterine palpation). Reviewed that treatment options of adenomyosis are geared at reducing endometrial activity and hence blood that would be accumulating in the myometrium which is the source of pain and heavy bleeding. Reviewed that this generally is achieved generally best with continuous COC/NuvaRing (if patient able to take estrogen containing birth control), Depo injections, or intrauterine devices (best with Mirena IUD). Reviewed that definitive management, especially if the patient has completed child-bearing, is generally via a hysterectomy which is both confirmatory and therapeutic. Reviewed that sometimes [place] can also be used for bleeding control and that this may help with pain as well but that the patient should be done with childbearing as it can impact placental implantation as well as ovarian reserve, albeit low rates. If the patient desired an endometrial ablation the pro/cons of this of were discussed and the potential impact on the limitations of future endometrial cancer sampling and PATSS.     Based on the patient's history/physical exam, the patient likely has fibroids.   Reviewed the  diagnosis of fibroids with the patient. Reviewed that fibroids are generally benign, non-cancerous growths of the uterine muscle that can either be asymptomatic or symptomatic in the form of bleeding or bulk symptoms. Reviewed  that the only way to exclude malignancy would be surgical removal to be 100% sure, but that the risk of malignancy is exceedingly low. Reviewed that we do not have good imaging or laboratory testing to detect underlying malignancy such as leiomyosarcoma (LMS) , but that this is again <1% of the time. Endometrial sampling does not detect LMS reliably. If the patient symptoms are symptomatic, expectant management, medical therapy, and surgical therapy were discussed.    Expectant management-The patient's fibroids were discussed and expectant management was offered with strict precautions. We reviewed hormonal methods to try and minimize bleeding. We discussed that the bulk symptoms are generally not improved with hormonal therapy and sometimes do not improve with menopause if very large, albeit the fact that fibroids generally shrink with menopause.     Hormonal management -has already completed and failed    Surgical management -   UAE-The patient was offered UAE/UFE and the procedure was detailed and discussed. Risks and benefits were discussed. Patient was offered a referral to the interventional radiology. Reviewed the possible impact on ovarian function and would not recommend in case of desire for future fertility in s/o possible pregnancy complications associated with decreased uterine blood supply.   Laparoscopic myomectomy -  Reviewed that while a laparoscopic myomectomy is preferred, size, location and number of fibroids may impact the best surgical approach. An open approach may be preferable to maximize fertility and symptom management.  Will get the MRI to better assess surgical planning if a laparoscopic approach was desired. Reviewed with patient the 1% risk of possible need for hysterectomy in setting of severe life-threatening bleeding. Reviewed that tissue removal would occur via in-bag morcellation in laparoscopic cases or risk of malignant dissemination with an open myomectomy a well. Reviewed the  need for C/S for delivery for further pregnancies.   Laparoscopic hysterectomy - If no longer desiring fertility, definitive surgical management via TLH was discussed with the patient. Recommended this over myomectomy in cases of no further interest in fertility for complete symptom management.       Total encounter time today was 75 minutes which was spent face-to-face counseling, providing patient education, coordinating her care plan, ordering testing, reviewing and documenting in the medical record    *Attestation: his note was transcribed with a voice dictation system.  There may be errors in the transcription as a result of this.  If there is any discrepancies or problems related to dictation I the Thereasa Parkin can be contacted.*

## 2022-12-01 ENCOUNTER — Ambulatory Visit (INDEPENDENT_AMBULATORY_CARE_PROVIDER_SITE_OTHER): Payer: Commercial Managed Care - POS | Admitting: Family Nurse Practitioner

## 2022-12-01 ENCOUNTER — Encounter (INDEPENDENT_AMBULATORY_CARE_PROVIDER_SITE_OTHER): Payer: Self-pay | Admitting: Family Nurse Practitioner

## 2022-12-01 ENCOUNTER — Ambulatory Visit
Admission: RE | Admit: 2022-12-01 | Discharge: 2022-12-01 | Disposition: A | Discharge status: Home / Self Care | Payer: Commercial Managed Care - POS | Source: Ambulatory Visit | Attending: Obstetrics and Gynecology | Admitting: Obstetrics and Gynecology

## 2022-12-01 VITALS — BP 125/81 | HR 76 | Temp 99.0°F | Resp 18 | Ht 67.0 in | Wt 250.0 lb

## 2022-12-01 DIAGNOSIS — E669 Obesity, unspecified: Secondary | ICD-10-CM | POA: Insufficient documentation

## 2022-12-01 DIAGNOSIS — Z6839 Body mass index (BMI) 39.0-39.9, adult: Secondary | ICD-10-CM

## 2022-12-01 DIAGNOSIS — Z01818 Encounter for other preprocedural examination: Secondary | ICD-10-CM | POA: Insufficient documentation

## 2022-12-01 DIAGNOSIS — I10 Essential (primary) hypertension: Secondary | ICD-10-CM | POA: Insufficient documentation

## 2022-12-01 DIAGNOSIS — D252 Subserosal leiomyoma of uterus: Secondary | ICD-10-CM | POA: Insufficient documentation

## 2022-12-01 DIAGNOSIS — N809 Endometriosis, unspecified: Secondary | ICD-10-CM | POA: Insufficient documentation

## 2022-12-01 DIAGNOSIS — D251 Intramural leiomyoma of uterus: Secondary | ICD-10-CM | POA: Insufficient documentation

## 2022-12-01 DIAGNOSIS — D259 Leiomyoma of uterus, unspecified: Secondary | ICD-10-CM

## 2022-12-01 DIAGNOSIS — R7303 Prediabetes: Secondary | ICD-10-CM | POA: Insufficient documentation

## 2022-12-01 DIAGNOSIS — G4733 Obstructive sleep apnea (adult) (pediatric): Secondary | ICD-10-CM | POA: Insufficient documentation

## 2022-12-01 LAB — TYPE AND SCREEN
AB Screen Gel: NEGATIVE
ABO Rh: O POS

## 2022-12-01 LAB — HEMOGLOBIN A1C
Average Estimated Glucose: 116.9 mg/dL
Hemoglobin A1C: 5.7 % — ABNORMAL HIGH (ref 4.6–5.6)

## 2022-12-01 MED ORDER — HYDROCHLOROTHIAZIDE 12.5 MG PO CAPS
12.5000 mg | ORAL_CAPSULE | Freq: Every day | ORAL | 0 refills | Status: AC
Start: 2022-12-01 — End: ?

## 2022-12-01 NOTE — PEC In-Person Visit (H&P) (Signed)
Pre-Anesthesia Evaluation     Pre-op Interview visit requested by:   Reason for pre-op interview visit: Patient anticipating LAPAROSCOPIC, MYOMECTOMY, LAPAROSCOPIC, FULGURATION/EXCISION, ENDOMETRIOSIS procedure.    History of Present Illness/Summary:  Patient presents to the [facility] clinic for a pre-operative evaluation.    Assessment/Plan:  Preoperative evaluation:  There are no active cardiopulmonary symptoms    1.  Encounter for pre-operative examination [id number]  Orders Placed This Encounter   Procedures    Hemoglobin A1C     2.  Surgical Diagnosis:  Endometriosis [N80.9]  Uterine leiomyoma, unspecified location [D25.9]    3. Hypertension: DBP in the 90's. Patient endorses DBP with home reading as well. On amlodipine 10 mg. Added a trial of HCTZ 12.5 mg and advice to f/u with PCP once she establish one. Meanwhile encorage lifestyle changes with weight loss, healthy heart diet and exercise    4. OSA: Not on CPAP    5. Obese: A1c sent for testing. 5.7 today. Will notify patient and discuss lifestyle changes for prediabetes    Pt is at an acceptable risk to proceed with above surgery     Patient voiced understanding of all instructions.  All questions and concerns addressed at this time. This assessment will be conveyed to the surgery and anesthesiology teams & the patient will be evaluated the morning of surgery.      Problem List:  Medical Problems       Hospital Problem List  Date Reviewed: 12/01/2022   None        Non-Hospital Problem List  Date Reviewed: 12/01/2022            ICD-10-CM Priority Class Noted    Cellulitis, unspecified cellulitis site L03.90   12/11/2021    Endometriosis N80.9   11/01/2022    Uterine leiomyoma, unspecified location D25.9   11/01/2022    Generalized anxiety disorder F41.1   09/30/2015    Depressed mood R45.89   10/27/2016    Primary hypertension I10   12/01/2022    OSA (obstructive sleep apnea) G47.33   12/01/2022    Obese E66.9   12/01/2022    Prediabetes R73.03   12/01/2022        Medical  History   Diagnosis Date    Anxiety     Depression     Endometriosis     Hypertension     Obese     Sleep apnea      Past Surgical History:   Procedure Laterality Date    LAPAROSCOPIC, EXCISION, ENDOMETRIOSIS      2013, 2015    LAPAROSCOPIC, SALPINGO-OOPHORECTOMY Left 2013    MYOMECTOMY  2016        Medication List            Accurate as of December 01, 2022  5:34 PM. Always use your most recent med list.                ALEVE PO  Take by mouth as needed  Medication Adjustments for Surgery: Take as prescribed     amLODIPine 10 MG tablet  Take 1 tablet (10 mg) by mouth nightly  Commonly known as: NORVASC  Medication Adjustments for Surgery: Take as prescribed     cyclobenzaprine 5 MG tablet  Take 1 tablet (5 mg) by mouth nightly May cut tablet in half if too sedating.  Commonly known as: FLEXERIL  Medication Adjustments for Surgery: Take as needed     DULoxetine 20 MG capsule  Commonly known as:  CYMBALTA  Medication Adjustments for Surgery: Take morning of surgery     hydroCHLOROthiazide 12.5 MG capsule  Take 1 capsule (12.5 mg) by mouth daily  Commonly known as: MICROZIDE  Medication Adjustments for Surgery: Take morning of surgery     norethindrone 5 MG tablet  TAKE 1 TABLET BY MOUTH EVERY DAY  Commonly known as: AYGESTIN  Medication Adjustments for Surgery: Take morning of surgery            No Known Allergies  Social History     Occupational History    Not on file   Tobacco Use    Smoking status: Never     Passive exposure: Never    Smokeless tobacco: Never   Vaping Use    Vaping Use: Never used   Substance and Sexual Activity    Alcohol use: Yes     Alcohol/week: 1.0 standard drink of alcohol     Types: 1 Glasses of wine per week     Comment: 1x/week    Drug use: Yes     Types: Marijuana     Comment: marijuana    Sexual activity: Yes     Partners: Male, Female     Birth control/protection: OCP, Condom       Menstrual History:   LMP / Status  Having periods     No LMP recorded.    Tubal Ligation?  No valid  surgical or medical questions entered.       Exam Scores:   SDB score  Risk Category: OSA Diagnosed No PAP    STBUR score       PONV score  Nausea Risk: VERY SEVERE RISK    MST score  MST Score: 0    PEN-FAST score       Frailty score       MICA  MICA %: 0.08    RCRI score  RCRI Score: 0    DASI  DASI Score: 18.95  METs Level: 5.07    EA-DIVA  EA-DIVA Score: 4    CHADsVasc          Visit Vitals  BP 125/81   Pulse 76   Temp 99 F (37.2 C)   Resp 18   Ht 1.702 m ('5\' 7"'$ )   Wt 113.4 kg (250 lb)   SpO2 99%   BMI 39.16 kg/m       Review of Systems   Constitutional: Negative.    HENT: Negative.     Eyes: Negative.    Respiratory: Negative.  Negative for stridor.    Cardiovascular: Negative.    Gastrointestinal: Negative.    Genitourinary: Negative.    Skin: Negative.    Neurological: Negative.    Endo/Heme/Allergies: Negative.    Psychiatric/Behavioral:  Positive for depression. The patient is nervous/anxious.         Stable     Physical Exam:  Mallampati: III  TM distance: > 3 FB (> 6 cm)  Mouth opening: > 3 FB (> 6 cm)  Neck extension: full  (-) enlarged neck circumference    Normal dentition    Normal neurological exam  Mental status: alert and oriented x3  Speech: normal  Motor Exam Performed: upper left - normal; upper right - normal; lower left - normal; lower right - normal    Normal cardiovascular exam  Heart rhythm: regular  no murmur:  Normal S1-S2    Examined Pulses: left carotid - normal; right carotid - normal; left radial - normal; right  radial - normal  (-) a murmur, JVD, a carotid bruit and peripheral edema    Normal pulmonary exam  Breath sounds clear to auscultation bilaterally  (-) rhonchi, , decreased breath sounds, wheezing, rales and stridor    Normal thoracolumbar exam  (-) scoliosis, skin abnormalities,  and kyphosis  Normal extremity exam(-) external extremity abnormalities    Normal abdominal exam.  Patient is not obese.  Scaphoid abdomen not present.  Abdomen: soft  Bowel sounds:  normal      Recent Labs   CBC (last 180 days) 10/21/22  0728   WBC 7.90   RBC 5.74*   Hgb 13.8   Hematocrit 41.5   MCV 72.3*   MCH 24.0*   MCHC 33.3   RDW 19*   Platelets 428*   MPV 10.2   Nucleated RBC 0.0   Absolute NRBC 0.00     Recent Labs   BMP (last 180 days) 10/21/22  0728   Glucose 102*   BUN 7.0   Creatinine 0.9   Sodium 138   Potassium 3.7   Chloride 106   CO2 21   Calcium 9.4   Anion Gap 11.0   eGFR >60.0         Recent Labs   Other (last 180 days) 10/21/22  0728 12/01/22  0957   Bilirubin, Total 0.5  --    ALT 13  --    AST (SGOT) 18  --    Protein, Total 7.7  --    Hemoglobin A1C  --  5.7*     Recent Labs   Type & Screen (last 34 days) 12/01/22  0958   ABO Rh O POS   AB Screen Gel NEG         Anesthesia Plan:  ASA 2   Anesthetic Options Discussed: General  Potential anesthesia/Perioperative problems: Sleep Disorderd Breathing          A discussion with regarding next steps to prepare for the procedure and the planned anesthesia care took place during today's visit.  I explained that the patient will meet with their anesthesiology providers on the DOS.  Discussed with Patient      Acceptability of blood products: Accepted  Use of blood products discussed with Patient

## 2022-12-16 ENCOUNTER — Ambulatory Visit
Admission: RE | Admit: 2022-12-16 | Discharge: 2022-12-16 | Disposition: A | Discharge status: Home / Self Care | Payer: Commercial Managed Care - POS | Attending: Obstetrics and Gynecology | Admitting: Obstetrics and Gynecology

## 2022-12-16 ENCOUNTER — Encounter
Admission: RE | Disposition: A | Discharge status: Home / Self Care | Payer: Self-pay | Source: Home / Self Care | Attending: Obstetrics and Gynecology

## 2022-12-16 ENCOUNTER — Inpatient Hospital Stay: Payer: Commercial Managed Care - POS

## 2022-12-16 ENCOUNTER — Ambulatory Visit: Payer: Self-pay

## 2022-12-16 DIAGNOSIS — I10 Essential (primary) hypertension: Secondary | ICD-10-CM | POA: Insufficient documentation

## 2022-12-16 DIAGNOSIS — N946 Dysmenorrhea, unspecified: Secondary | ICD-10-CM | POA: Insufficient documentation

## 2022-12-16 DIAGNOSIS — N809 Endometriosis, unspecified: Principal | ICD-10-CM | POA: Insufficient documentation

## 2022-12-16 DIAGNOSIS — Z79899 Other long term (current) drug therapy: Secondary | ICD-10-CM | POA: Insufficient documentation

## 2022-12-16 DIAGNOSIS — D259 Leiomyoma of uterus, unspecified: Secondary | ICD-10-CM | POA: Insufficient documentation

## 2022-12-16 DIAGNOSIS — N840 Polyp of corpus uteri: Secondary | ICD-10-CM | POA: Insufficient documentation

## 2022-12-16 DIAGNOSIS — N736 Female pelvic peritoneal adhesions (postinfective): Secondary | ICD-10-CM

## 2022-12-16 DIAGNOSIS — K668 Other specified disorders of peritoneum: Secondary | ICD-10-CM | POA: Insufficient documentation

## 2022-12-16 DIAGNOSIS — K660 Peritoneal adhesions (postprocedural) (postinfection): Secondary | ICD-10-CM | POA: Insufficient documentation

## 2022-12-16 DIAGNOSIS — G4733 Obstructive sleep apnea (adult) (pediatric): Secondary | ICD-10-CM | POA: Insufficient documentation

## 2022-12-16 HISTORY — PX: CYSTOSCOPY, DIAGNOSTIC: SHX3552

## 2022-12-16 HISTORY — PX: LAPAROSCOPIC, MYOMECTOMY: SHX4536

## 2022-12-16 HISTORY — PX: HYSTEROSCOPY, POLYPECTOMY: SHX5777

## 2022-12-16 HISTORY — PX: LAPAROSCOPIC, FULGURATION/EXCISION, ENDOMETRIOSIS: SHX5775

## 2022-12-16 LAB — CBC
Absolute NRBC: 0 10*3/uL (ref 0.00–0.00)
Hematocrit: 39.3 % (ref 34.7–43.7)
Hgb: 12.7 g/dL (ref 11.4–14.8)
MCH: 24.1 pg — ABNORMAL LOW (ref 25.1–33.5)
MCHC: 32.3 g/dL (ref 31.5–35.8)
MCV: 74.6 fL — ABNORMAL LOW (ref 78.0–96.0)
MPV: 10 fL (ref 8.9–12.5)
Nucleated RBC: 0 /100 WBC (ref 0.0–0.0)
Platelets: 364 10*3/uL — ABNORMAL HIGH (ref 142–346)
RBC: 5.27 10*6/uL — ABNORMAL HIGH (ref 3.90–5.10)
RDW: 16 % — ABNORMAL HIGH (ref 11–15)
WBC: 6.23 10*3/uL (ref 3.10–9.50)

## 2022-12-16 LAB — IHS 2ND ABORH REQUEST

## 2022-12-16 LAB — BLOOD TYPE CONFIRMATION: Blood Type Confirmation: O POS

## 2022-12-16 LAB — URINE BETA HCG POCT: Urine bHCG POC: NEGATIVE

## 2022-12-16 SURGERY — LAPAROSCOPIC, MYOMECTOMY
Anesthesia: Anesthesia General | Site: Pelvis | Wound class: Clean Contaminated

## 2022-12-16 MED ORDER — SODIUM CHLORIDE 0.9 % IV SOLN
INTRAVENOUS | Status: AC | PRN
Start: 2022-12-16 — End: 2022-12-16
  Administered 2022-12-16: 30 mL

## 2022-12-16 MED ORDER — TRANEXAMIC ACID-NACL 1000-0.7 MG/100ML-% IV SOLN
INTRAVENOUS | Status: AC
Start: 2022-12-16 — End: ?
  Filled 2022-12-16: qty 100

## 2022-12-16 MED ORDER — IBUPROFEN 600 MG PO TABS
600.0000 mg | ORAL_TABLET | Freq: Four times a day (QID) | ORAL | 1 refills | Status: AC | PRN
Start: 2022-12-16 — End: ?

## 2022-12-16 MED ORDER — ROCURONIUM BROMIDE 50 MG/5ML IV SOLN
INTRAVENOUS | Status: AC
Start: 2022-12-16 — End: ?
  Filled 2022-12-16: qty 5

## 2022-12-16 MED ORDER — FENTANYL CITRATE (PF) 50 MCG/ML IJ SOLN (WRAP)
25.0000 ug | INTRAMUSCULAR | Status: AC | PRN
Start: 2022-12-16 — End: 2022-12-16
  Administered 2022-12-16 (×3): 25 ug via INTRAVENOUS

## 2022-12-16 MED ORDER — FENTANYL CITRATE (PF) 50 MCG/ML IJ SOLN (WRAP)
INTRAMUSCULAR | Status: AC
Start: 2022-12-16 — End: ?
  Filled 2022-12-16: qty 2

## 2022-12-16 MED ORDER — ROCURONIUM BROMIDE 50 MG/5ML IV SOLN
INTRAVENOUS | Status: DC | PRN
Start: 2022-12-16 — End: 2022-12-16
  Administered 2022-12-16: 20 mg via INTRAVENOUS
  Administered 2022-12-16: 10 mg via INTRAVENOUS
  Administered 2022-12-16: 15 mg via INTRAVENOUS
  Administered 2022-12-16: 50 mg via INTRAVENOUS
  Administered 2022-12-16 (×2): 10 mg via INTRAVENOUS
  Administered 2022-12-16: 20 mg via INTRAVENOUS
  Administered 2022-12-16: 15 mg via INTRAVENOUS

## 2022-12-16 MED ORDER — HYDROMORPHONE HCL 1 MG/ML IJ SOLN
INTRAMUSCULAR | Status: AC
Start: 2022-12-16 — End: ?
  Filled 2022-12-16: qty 1

## 2022-12-16 MED ORDER — OXYCODONE HCL 5 MG PO TABS
ORAL_TABLET | ORAL | Status: AC
Start: 2022-12-16 — End: 2022-12-16
  Administered 2022-12-16: 5 mg via ORAL
  Filled 2022-12-16: qty 1

## 2022-12-16 MED ORDER — TRANEXAMIC ACID-NACL 1000-0.7 MG/100ML-% IV SOLN
1000.0000 mg | Freq: Once | INTRAVENOUS | Status: AC
Start: 2022-12-16 — End: 2022-12-16
  Administered 2022-12-16: 1000 mg via INTRAVENOUS

## 2022-12-16 MED ORDER — KETOROLAC TROMETHAMINE 30 MG/ML IJ SOLN
INTRAMUSCULAR | Status: DC | PRN
Start: 2022-12-16 — End: 2022-12-16
  Administered 2022-12-16: 30 mg via INTRAVENOUS

## 2022-12-16 MED ORDER — PROPOFOL 10 MG/ML IV EMUL (WRAP)
INTRAVENOUS | Status: AC
Start: 2022-12-16 — End: ?
  Filled 2022-12-16: qty 20

## 2022-12-16 MED ORDER — MIDAZOLAM HCL 1 MG/ML IJ SOLN (WRAP)
INTRAMUSCULAR | Status: AC
Start: 2022-12-16 — End: ?
  Filled 2022-12-16: qty 2

## 2022-12-16 MED ORDER — LIDOCAINE HCL (PF) 1 % IJ SOLN
INTRAMUSCULAR | Status: AC
Start: 2022-12-16 — End: ?
  Filled 2022-12-16: qty 5

## 2022-12-16 MED ORDER — PHENYLEPHRINE 100 MCG/ML IV SOSY (WRAP)
PREFILLED_SYRINGE | INTRAVENOUS | Status: AC
Start: 2022-12-16 — End: ?
  Filled 2022-12-16: qty 10

## 2022-12-16 MED ORDER — SODIUM CHLORIDE (PF) 0.9 % IJ SOLN
INTRAMUSCULAR | Status: AC
Start: 2022-12-16 — End: ?
  Filled 2022-12-16: qty 10

## 2022-12-16 MED ORDER — ACETAMINOPHEN 500 MG PO TABS
ORAL_TABLET | ORAL | Status: AC
Start: 2022-12-16 — End: ?
  Filled 2022-12-16: qty 2

## 2022-12-16 MED ORDER — DEXAMETHASONE SODIUM PHOSPHATE 4 MG/ML IJ SOLN (WRAP)
INTRAMUSCULAR | Status: DC | PRN
Start: 2022-12-16 — End: 2022-12-16
  Administered 2022-12-16: 8 mg via INTRAVENOUS

## 2022-12-16 MED ORDER — SUCCINYLCHOLINE CHLORIDE 20 MG/ML IJ/IV SOLN (WRAP)
Status: AC
Start: 2022-12-16 — End: ?
  Filled 2022-12-16: qty 5

## 2022-12-16 MED ORDER — GLYCOPYRROLATE 0.2 MG/ML IJ SOLN (WRAP)
INTRAMUSCULAR | Status: DC | PRN
Start: 2022-12-16 — End: 2022-12-16
  Administered 2022-12-16: .8 mg via INTRAVENOUS

## 2022-12-16 MED ORDER — GABAPENTIN 300 MG PO CAPS
300.0000 mg | ORAL_CAPSULE | Freq: Once | ORAL | Status: AC
Start: 2022-12-16 — End: 2022-12-16
  Administered 2022-12-16: 300 mg via ORAL

## 2022-12-16 MED ORDER — PROPOFOL 10 MG/ML IV EMUL (WRAP)
INTRAVENOUS | Status: AC
Start: 2022-12-16 — End: ?
  Filled 2022-12-16: qty 50

## 2022-12-16 MED ORDER — KETAMINE HCL 50 MG/ML IJ/IV SOLN (WRAP)
Status: DC | PRN
Start: 2022-12-16 — End: 2022-12-16
  Administered 2022-12-16 (×2): 15 mg via INTRAVENOUS
  Administered 2022-12-16: 20 mg via INTRAVENOUS

## 2022-12-16 MED ORDER — FLUORESCEIN SODIUM 10 % IV SOLN
INTRAVENOUS | Status: AC
Start: 2022-12-16 — End: ?
  Filled 2022-12-16: qty 5

## 2022-12-16 MED ORDER — HYDROMORPHONE HCL 1 MG/ML IJ SOLN
INTRAMUSCULAR | Status: DC | PRN
Start: 2022-12-16 — End: 2022-12-16

## 2022-12-16 MED ORDER — MISOPROSTOL 100 MCG PO TABS
ORAL_TABLET | ORAL | Status: DC | PRN
Start: 2022-12-16 — End: 2022-12-16
  Administered 2022-12-16: 800 ug via RECTAL

## 2022-12-16 MED ORDER — EPHEDRINE SULFATE 50 MG/ML IJ/IV SOLN (WRAP)
Status: DC | PRN
Start: 2022-12-16 — End: 2022-12-16

## 2022-12-16 MED ORDER — ONDANSETRON HCL 4 MG/2ML IJ SOLN
INTRAMUSCULAR | Status: DC | PRN
Start: 2022-12-16 — End: 2022-12-16
  Administered 2022-12-16: 4 mg via INTRAVENOUS

## 2022-12-16 MED ORDER — ACETAMINOPHEN 500 MG PO TABS
1000.0000 mg | ORAL_TABLET | Freq: Once | ORAL | Status: AC
Start: 2022-12-16 — End: 2022-12-16
  Administered 2022-12-16: 1000 mg via ORAL

## 2022-12-16 MED ORDER — FENTANYL CITRATE (PF) 50 MCG/ML IJ SOLN (WRAP)
INTRAMUSCULAR | Status: AC
Start: 2022-12-16 — End: 2022-12-16
  Administered 2022-12-16: 25 ug via INTRAVENOUS
  Filled 2022-12-16: qty 2

## 2022-12-16 MED ORDER — MISOPROSTOL 200 MCG PO TABS
ORAL_TABLET | ORAL | Status: AC
Start: 2022-12-16 — End: ?
  Filled 2022-12-16: qty 4

## 2022-12-16 MED ORDER — SUCCINYLCHOLINE CHLORIDE 20 MG/ML IJ SOLN
INTRAMUSCULAR | Status: DC | PRN
Start: 2022-12-16 — End: 2022-12-16
  Administered 2022-12-16: 100 mg via INTRAVENOUS

## 2022-12-16 MED ORDER — FENTANYL CITRATE (PF) 50 MCG/ML IJ SOLN (WRAP)
INTRAMUSCULAR | Status: DC | PRN
Start: 2022-12-16 — End: 2022-12-16
  Administered 2022-12-16 (×2): 25 ug via INTRAVENOUS
  Administered 2022-12-16: 100 ug via INTRAVENOUS
  Administered 2022-12-16 (×2): 25 ug via INTRAVENOUS

## 2022-12-16 MED ORDER — STERILE WATER FOR INJECTION IJ/IV SOLN (WRAP)
2.0000 g | INTRAMUSCULAR | Status: AC
Start: 2022-12-16 — End: 2022-12-16
  Administered 2022-12-16 (×2): 2 g via INTRAVENOUS

## 2022-12-16 MED ORDER — VASOPRESSIN 20 UNIT/ML IV SOLN (WRAP)
Status: DC | PRN
Start: 2022-12-16 — End: 2022-12-16
  Administered 2022-12-16: 3 [IU]

## 2022-12-16 MED ORDER — NEOSTIGMINE METHYLSULFATE 1 MG/ML IJ/IV SOLN (WRAP)
Status: DC | PRN
Start: 2022-12-16 — End: 2022-12-16
  Administered 2022-12-16: 5 mg via INTRAVENOUS

## 2022-12-16 MED ORDER — METRONIDAZOLE IN NACL 500 MG/100 ML IV SOLN (WRAP)
INTRAVENOUS | Status: AC
Start: 2022-12-16 — End: ?
  Filled 2022-12-16: qty 100

## 2022-12-16 MED ORDER — MISOPROSTOL 200 MCG PO TABS
800.0000 ug | ORAL_TABLET | Freq: Once | ORAL | Status: DC
Start: 2022-12-16 — End: 2022-12-16

## 2022-12-16 MED ORDER — GABAPENTIN 300 MG PO CAPS
ORAL_CAPSULE | ORAL | Status: AC
Start: 2022-12-16 — End: ?
  Filled 2022-12-16: qty 1

## 2022-12-16 MED ORDER — ONDANSETRON HCL 4 MG/2ML IJ SOLN
INTRAMUSCULAR | Status: AC
Start: 2022-12-16 — End: 2022-12-16
  Administered 2022-12-16: 4 mg via INTRAVENOUS
  Filled 2022-12-16: qty 2

## 2022-12-16 MED ORDER — NEOSTIGMINE METHYLSULFATE 1 MG/ML IJ/IV SOLN (WRAP)
Status: AC
Start: 2022-12-16 — End: ?
  Filled 2022-12-16: qty 10

## 2022-12-16 MED ORDER — HYDROMORPHONE HCL 1 MG/ML IJ SOLN
INTRAMUSCULAR | Status: AC
Start: 2022-12-16 — End: 2022-12-16
  Administered 2022-12-16: 0.5 mg via INTRAVENOUS
  Filled 2022-12-16: qty 1

## 2022-12-16 MED ORDER — KETOROLAC TROMETHAMINE 60 MG/2ML IM SOLN
INTRAMUSCULAR | Status: AC
Start: 2022-12-16 — End: ?
  Filled 2022-12-16: qty 2

## 2022-12-16 MED ORDER — HYDROMORPHONE HCL 1 MG/ML IJ SOLN
0.5000 mg | INTRAMUSCULAR | Status: DC | PRN
Start: 2022-12-16 — End: 2022-12-16

## 2022-12-16 MED ORDER — ONDANSETRON HCL 4 MG/2ML IJ SOLN
INTRAMUSCULAR | Status: AC
Start: 2022-12-16 — End: ?
  Filled 2022-12-16: qty 2

## 2022-12-16 MED ORDER — LACTATED RINGERS IV SOLN
INTRAVENOUS | Status: DC | PRN
Start: 2022-12-16 — End: 2022-12-16

## 2022-12-16 MED ORDER — METHYLENE BLUE (ANTIDOTE) 50 MG/10ML IV SOLN
INTRAVENOUS | Status: AC
Start: 2022-12-16 — End: ?
  Filled 2022-12-16: qty 10

## 2022-12-16 MED ORDER — BUPIVACAINE HCL 0.5 % IJ SOLN
INTRAMUSCULAR | Status: DC | PRN
Start: 2022-12-16 — End: 2022-12-16
  Administered 2022-12-16: 7 mL

## 2022-12-16 MED ORDER — GLYCOPYRROLATE 0.2 MG/ML IJ SOLN (WRAP)
INTRAMUSCULAR | Status: AC
Start: 2022-12-16 — End: ?
  Filled 2022-12-16: qty 4

## 2022-12-16 MED ORDER — DEXAMETHASONE SODIUM PHOSPHATE 4 MG/ML IJ SOLN
INTRAMUSCULAR | Status: AC
Start: 2022-12-16 — End: ?
  Filled 2022-12-16: qty 2

## 2022-12-16 MED ORDER — SEVOFLURANE IN SOLN
RESPIRATORY_TRACT | Status: AC
Start: 2022-12-16 — End: ?
  Filled 2022-12-16: qty 250

## 2022-12-16 MED ORDER — PROPOFOL INFUSION 10 MG/ML
INTRAVENOUS | Status: DC | PRN
Start: 2022-12-16 — End: 2022-12-16
  Administered 2022-12-16 (×2): 30 ug/kg/min via INTRAVENOUS

## 2022-12-16 MED ORDER — CEFAZOLIN SODIUM 1 G IJ SOLR
INTRAMUSCULAR | Status: AC
Start: 2022-12-16 — End: ?
  Filled 2022-12-16: qty 2000

## 2022-12-16 MED ORDER — LIDOCAINE HCL (PF) 1 % IJ SOLN
INTRAMUSCULAR | Status: DC | PRN
Start: 2022-12-16 — End: 2022-12-16
  Administered 2022-12-16: 50 mg via INTRAVENOUS

## 2022-12-16 MED ORDER — KETAMINE HCL 10 MG/ML IJ/IV SOLN (WRAP)
Status: AC
Start: 2022-12-16 — End: ?
  Filled 2022-12-16: qty 5

## 2022-12-16 MED ORDER — EPHEDRINE SULFATE 50 MG/ML IJ/IV SOLN (WRAP)
Status: AC
Start: 2022-12-16 — End: ?
  Filled 2022-12-16: qty 1

## 2022-12-16 MED ORDER — HYDROMORPHONE HCL 1 MG/ML IJ SOLN
INTRAMUSCULAR | Status: DC | PRN
Start: 2022-12-16 — End: 2022-12-16
  Administered 2022-12-16 (×2): .5 mg via INTRAMUSCULAR

## 2022-12-16 MED ORDER — BENZOCAINE-MENTHOL MT LOZG (WRAP)
1.0000 | LOZENGE | OROMUCOSAL | Status: DC | PRN
Start: 2022-12-16 — End: 2022-12-16

## 2022-12-16 MED ORDER — OXYCODONE HCL 5 MG PO TABS
5.0000 mg | ORAL_TABLET | Freq: Four times a day (QID) | ORAL | 0 refills | Status: AC | PRN
Start: 2022-12-16 — End: ?

## 2022-12-16 MED ORDER — ONDANSETRON HCL 4 MG/2ML IJ SOLN
4.0000 mg | Freq: Once | INTRAMUSCULAR | Status: AC | PRN
Start: 2022-12-16 — End: 2022-12-16

## 2022-12-16 MED ORDER — ACETAMINOPHEN 500 MG PO TABS
1000.0000 mg | ORAL_TABLET | Freq: Four times a day (QID) | ORAL | Status: DC | PRN
Start: 2022-12-16 — End: 2022-12-16
  Administered 2022-12-16: 1000 mg via ORAL

## 2022-12-16 MED ORDER — OXYCODONE HCL 5 MG PO TABS
5.0000 mg | ORAL_TABLET | Freq: Once | ORAL | Status: AC | PRN
Start: 2022-12-16 — End: 2022-12-16

## 2022-12-16 MED ORDER — MIDAZOLAM HCL 1 MG/ML IJ SOLN (WRAP)
INTRAMUSCULAR | Status: DC | PRN
Start: 2022-12-16 — End: 2022-12-16
  Administered 2022-12-16: 2 mg via INTRAVENOUS

## 2022-12-16 MED ORDER — METRONIDAZOLE IN NACL 500 MG/100 ML IV SOLN (WRAP)
500.0000 mg | INTRAVENOUS | Status: AC
Start: 2022-12-16 — End: 2022-12-16
  Administered 2022-12-16: 500 mg via INTRAVENOUS

## 2022-12-16 MED ORDER — PROPOFOL 10 MG/ML IV EMUL (WRAP)
INTRAVENOUS | Status: DC | PRN
Start: 2022-12-16 — End: 2022-12-16
  Administered 2022-12-16: 200 mg via INTRAVENOUS

## 2022-12-16 MED ORDER — EPHEDRINE SULFATE 50 MG/ML IJ/IV SOLN (WRAP)
Status: DC | PRN
Start: 2022-12-16 — End: 2022-12-16
  Administered 2022-12-16: 10 mg via INTRAMUSCULAR

## 2022-12-16 MED ORDER — OXYCODONE HCL 5 MG PO TABS
5.0000 mg | ORAL_TABLET | Freq: Four times a day (QID) | ORAL | 0 refills | Status: DC | PRN
Start: 2022-12-16 — End: 2022-12-16

## 2022-12-16 SURGICAL SUPPLY — 149 items
ADHESIVE SKIN CLOSURE DERMABOND ADVANCED (Skin Closure) ×2
ADHESIVE SKIN CLOSURE DERMABOND ADVANCED .7 ML LIQUID APPLICATOR (Skin Closure) ×2 IMPLANT
APPLICATOR CHLORAPREP 26 ML 70% ISOPROPYL ALCOHOL 2% CHLORHEXIDINE (Applicator) ×2 IMPLANT
APPLICATOR PRP 70% ISPRP 2% CHG 26ML (Applicator) ×2
APPLICATOR SRG SS DUPLOSPRAY MIS 30CM (Hemostat) ×2
APPLICATOR SRGCL 30CM SS SNP LCK CNNL TUBE ST RPLCBL TP DUPLOSPRAY MIS (Hemostat) IMPLANT
APPLIER INTERNAL CLIP MEDIUM LARGE (Endoscopic Supplies)
APPLIER INTERNAL CLIP MEDIUM LARGE TITANIUM ANGLE JAW DISTAL TIP CLOSE (Endoscopic Supplies) IMPLANT
BLADE SHAVER OD2.9 MM WINDOW LOCK CLOSED (Blade)
BLADE SHAVER OD2.9 MM WINDOW LOCK CLOSED CUT EDGE MINI L357 MM (Blade) IMPLANT
CAP ENDOSCOPE SEALING GYN (Cautery) IMPLANT
CATHETER URETHRAL OD14 FR 5 CC FOLEY 2 (Catheter Urine)
CATHETER URETHRAL OD14 FR 5 CC FOLEY 2 WAY BALLOON BACTI-GUARD NATURAL (Catheter Urine) IMPLANT
CORD ELECTROSURGICAL BIPOLAR HIGH (Procedure Accessories) ×2
CORD ELECTROSURGICAL BIPOLAR HIGH FREQUENCY VALLEYLAB GENERATOR (Procedure Accessories) ×2 IMPLANT
COVER FLEXIBLE MEDLINE LIGHT HANDLE (Drape) ×2
COVER FLEXIBLE MEDLINE LIGHT HANDLE PLASTIC GREEN (Drape) ×2 IMPLANT
CURETTE PIPELLE OD3.1 MM SUCTION (Instrument)
CURETTE PIPELLE OD3.1 MM SUCTION ENDOMETRIUM RAPID (Instrument) IMPLANT
DEVICE 5 MM 10/12 MM CARTER-THOMASON PILOT CLOSURE SUTURE PASSER GUIDE (Suture) IMPLANT
DEVICE CLOSURE 5 MM 10/12 MM (Suture) ×2
DEVICE CLOSURE 5MM 10/12 MM (Suture) ×2
DEVICE CLOSURE L6 IN 2-0 GS-22 V-LOC 90 (Suture) ×6
DEVICE CLOSURE L9 IN 2-0 GS-21 V-LOC 90 (Instrument) ×2
DEVICE CLOSURE L9 IN 2-0 GS-22 V-LOC 90 (Suture) ×4
DEVICE SPECIMEN RETRIEVAL OD12/15 MM (Procedure Accessories) ×2
DEVICE SPECIMEN RETRIEVAL OD12/15 MM ODSEC12 MM GUIDEBEAD INZII (Procedure Accessories) IMPLANT
DEVICE SPECIMEN RETRIEVAL OD5 MM GUIDE (Procedure Accessories)
DEVICE SPECIMEN RETRIEVAL OD5 MM GUIDE BEAD INZII UNIVERSAL 180 ML (Procedure Accessories) IMPLANT
DRAPE SURGICAL ABSORBENT REINFORCE (Drape) ×2
DRAPE SURGICAL ABSORBENT REINFORCE FENESTRATE ADHESIVE TROUGH L122 IN (Drape) ×2 IMPLANT
DRESSING WOUND TELFA L8 IN X W3 IN (Dressing) ×2
DRESSING WOUND TELFA L8 IN X W3 IN ISLAND HYPOALLERGENIC NONADHERENT (Dressing) ×2 IMPLANT
ELECTRODE ADULT PATIENT RETURN L9 FT REM POLYHESIVE ACRYLIC FOAM (Procedure Accessories) ×2 IMPLANT
ELECTRODE ELECTROSURGICAL BLADE L6.5 IN (Cautery)
ELECTRODE ELECTROSURGICAL BLADE L6.5 IN EDGE (Cautery) IMPLANT
ELECTRODE ELECTROSURGICAL CUT LOOP OD7 (Procedure Accessories)
ELECTRODE ELECTROSURGICAL CUT LOOP OD7 MM THE PRINCESS RESECTOSCOPE (Procedure Accessories) IMPLANT
ELECTRODE ELECTROSURGICAL CUT LOOP OD7 MM THE PRINCESS ROLLER BIPOLAR (Urology Supply) IMPLANT
ELECTRODE ELECTROSURGICAL CUT LOOP OD8 (Procedure Accessories)
ELECTRODE ELECTROSURGICAL CUT LOOP OD8 MM THE PRINCESS BIPOLAR (Procedure Accessories) IMPLANT
ELECTRODE ESURG CUT LOOP THE PRNC 7MM (Urology Supply)
ELECTRODE PATIENT RETURN L9 FT VALLEYLAB (Procedure Accessories) ×2
GLOVE SURGICAL 6 1/2 BIOGEL PI INDICATOR (Glove) ×2
GLOVE SURGICAL 6 1/2 BIOGEL PI INDICATOR UNDERGLOVE POWDER FREE SMOOTH (Glove) ×2 IMPLANT
GLOVE SURGICAL 6 1/2 BIOGEL PI ORTHOPRO (Glove) ×4
GLOVE SURGICAL 6 1/2 BIOGEL PI ORTHOPRO POWDER FREE MICRO ROUGH (Glove) ×4 IMPLANT
GLOVE SURGICAL 6 BIOGEL PI INDICATOR (Glove) ×2
GLOVE SURGICAL 6 BIOGEL PI INDICATOR UNDERGLOVE POWDER FREE SMOOTH (Glove) ×2 IMPLANT
GLOVE SURGICAL 6 BIOGEL PI POWDER FREE (Glove) ×2
GLOVE SURGICAL 6 BIOGEL PI POWDER FREE ROUGH BEAD CUFF NONPYROGENIC (Glove) ×2 IMPLANT
GLOVE SURGICAL 6 BIOGEL ULTRATOUCH G (Glove) ×2
GLOVE SURGICAL 6 BIOGEL ULTRATOUCH G POWDER FREE ROUGH BEAD CUFF (Glove) ×2 IMPLANT
HEMOSTAT ABSORBABLE L14 IN X W2 IN (Dressing)
HEMOSTAT ABSORBABLE L14 IN X W2 IN FLEXIBLE SHEER WEAVE SURGICEL (Dressing) IMPLANT
HYSTEROSCOPE RIGID SEAL LONG WORK (Procedure Accessories) ×2
HYSTEROSCOPE RIGID SEAL LONG WORK CHANNEL ANGLE TIP TRUCLEAR ELITE (Procedure Accessories) ×2 IMPLANT
IRRIGATOR SUCTION TIP STRYKEFLOW 2 (Respiratory Supplies) ×2 IMPLANT
JELLY LUB EZ LF STRL H2O SOL NGRS TRNLU (Irrigation Solutions) ×4 IMPLANT
KIT RM TURNOVER LF NS DISP (Kits) ×4
KIT ROOM TURNOVER NONSTERILE LATEX FREE DISPOSABLE (Kits) ×4 IMPLANT
KIT TISS CLSR 10ML DPJT TSSL LF STRL (Hemostat) ×2
KIT TISSUE CLOSURE FREEZE DRIED NONPYROGENIC DUPLOJECT TISSEEL 10 ML (Hemostat) IMPLANT
MANIPULATOR CERVIX UTERUS OD32 MM (Retractor)
MANIPULATOR UT MED VCARE + 34MM LF STRL (Procedure Accessories) IMPLANT
MANIPULATOR UTERINE 34MM VCARE PLUS MD CUP STRL LF DSPSBL (Procedure Accessories) IMPLANT
MANIPULATOR UTERINE LARGE 2 CUP LOCK (Procedure Accessories)
MANIPULATOR UTERINE LG 2 CUP LCK SCRW BLLN 37MM VCR CRVCL HYSTERECTOMY (Procedure Accessories) IMPLANT
MANIPULATOR UTERINE SMALL CUP OD32 MM VCARE PLUS CERVICAL (Retractor) IMPLANT
MANIPULATOR UTERINE VCARE DX PLUS (Procedure Accessories) IMPLANT
MANIPULATOR UTERINE XL CUP OD40 MM VCARE (Retractor)
MANIPULATOR UTERINE XL CUP OD40 MM VCARE PLUS CERVICAL (Retractor) IMPLANT
MORCELLATOR SURGICAL RECIPROCATE OD4 MM (Blade)
MORCELLATOR SURGICAL RECIPROCATE OD4 MM TRUCLEAR ULTRA PLUS (Blade) IMPLANT
MORCELLATOR SURGICAL ROTARY TRUCLEAR (Blade)
MORCELLATOR SURGICAL ROTARY TRUCLEAR HYSTEROSCOPY (Blade) IMPLANT
NEEDLE HPO SS PP RW BD 25GA 1.5IN LF (Needles) ×2 IMPLANT
NEEDLE INSUFFLATION L120 MM OD14 GA (Needles) ×2
NEEDLE INSUFFLATION L120 MM OD14 GA ENDOPATH SPRING LOAD BLUNT STYLET (Needles) ×2 IMPLANT
NEEDLE INSUFFLATION L120 MM OD14 GA ENDOPATHÂ® SPRING LOAD BLUNT STYLET (Needles) ×2 IMPLANT
NEEDLE SPINAL BD OD25 GA L3 1/2 IN (Needles) ×2
NEEDLE SPINAL BD OD25 GA L3 1/2 IN REGULAR WALL TIP POLYPROPYLENE BLUE (Needles) ×2 IMPLANT
PACK SURGICAL GYN LAP ROYAL ALEXANDRA (Pack) ×2
PAD TRENDELENBURG LARGE ARM PROTECTOR (Positioning Supplies) ×2
PAD TRENDELENBURG LARGE ARM PROTECTOR POSITIONING 90912 TRENDELENBURG (Positioning Supplies) ×2 IMPLANT
PLUG CATHETER FOLEY CAP DRAINAGE LUMEN (Procedure Accessories) IMPLANT
POUCH STERILIZATION L10 IN X W6 IN (Sterilization Supply) ×2
POUCH STERILIZATION L10 IN X W6 IN THK2.5 MIL SELF SEAL LOW (Sterilization Supply) IMPLANT
PROTECTOR TISSUE XL FLEXIBLE RETRACTION (Retractor)
PROTECTOR TISSUE XL FLEXIBLE RETRACTION RING ATRAUMATIC SELF RETAIN (Retractor) IMPLANT
RETRACTOR ALEXIS FLEXIBLE RETRACTION (Retractor)
RETRACTOR TISSUE SMALL FLEXIBLE RETRACTION RING ATRAUMATIC SELF RETAIN (Retractor) IMPLANT
SET HIGH FLOW SMOKE EVACUATION (Tubing) ×2
SET HIGH FLOW SMOKE EVACUATION PNEUMOCLEAR TUBING (Tubing) ×2 IMPLANT
SET INFLOW HYSTEROLUX TUBING (Procedure Accessories) ×2
SET INFLOW HYSTEROLUX TUBING HYSTEROSCOPIC (Procedure Accessories) ×2 IMPLANT
SET OUTFLOW TUBING HYSTEROSCOPIC (Tubing) ×2 IMPLANT
SHEARS ELECTROSURGICAL L36 CM CURVE 3 (Other) IMPLANT
SLEEVE LAPAROSCOPIC L100 MM ADVANCE (Procedure Accessories) ×2
SLEEVE LAPAROSCOPIC L100 MM ADVANCE FIXATION CANNULA SEAL OD5 MM KII (Procedure Accessories) ×2 IMPLANT
SOLUTION IRR 0.9% NACL 3L URMTC LF PLS (Irrigation Solutions) ×6
SOLUTION IRRIGATION 0.9% SDM CHLORIDE 500ML PR BTTL ISOTONIC NONPRGNC (Irrigation Solutions) ×2 IMPLANT
SOLUTION IRRIGATION 0.9% SODIUM CHLORIDE (Irrigation Solutions) ×2
SOLUTION IRRIGATION 0.9% SODIUM CHLORIDE 3000 ML PLASTIC CONTAINER (Irrigation Solutions) ×6 IMPLANT
SPONGE LAPAROTOMY L18 IN X W18 IN (Sponge)
SPONGE LAPAROTOMY L18 IN X W18 IN PREWASH WHITE (Sponge) IMPLANT
SUTURE COATED VICRYL 0 UR-6 L27 IN BRAID (Suture) ×2 IMPLANT
SUTURE COATED VICRYL 3-0 SH L27 IN BRAID (Suture) IMPLANT
SUTURE MONOCRYL 4-0 PS-2 L18 IN (Suture) ×2
SUTURE MONOCRYL 4-0 PS-2 L18 IN MONOFILAMENT UNDYED ABSORBABLE (Suture) ×2 IMPLANT
SUTURE MONOCRYL 4-0 PS-2 L27 IN (Suture) ×4
SUTURE MONOCRYL 4-0 PS-2 L27 IN MONOFILAMENT UNDYED ABSORBABLE (Suture) ×4 IMPLANT
SUTURE V-LOC 180 0 GS-21 1/2 CIRCLE L12 IN ABS GREEN (Suture) ×2 IMPLANT
SUTURE V-LOC 180 0 GS-21 1/2 CIRCLE L6 IN ABS GREEN (Suture) IMPLANT
SUTURE V-LOC 180 0 GS-21 L12 IN (Suture) ×2
SUTURE V-LOC 180 0 GS-21 L15 CM (Suture) ×2
SUTURE V-LOC 90 2-0 GS-21 1/2 CIRCLE L9 IN ABS VIOLET (Instrument) ×2 IMPLANT
SUTURE V-LOC 90 2-0 GS-22 1/2 CIRCLE L6 IN ABS VIOLET (Suture) IMPLANT
SUTURE V-LOC 90 2-0 GS-22 1/2 CIRCLE L9 IN ABS VIOLET (Suture) ×4 IMPLANT
SYRINGE 10 ML CONTROL CONCENTRIC TIP (Syringes, Needles) ×2
SYRINGE 10 ML CONTROL CONCENTRIC TIP PYROGEN FREE DEHP FREE LOK (Syringes, Needles) ×2 IMPLANT
SYRINGE 20 ML BD LUER-LOK MEDICAL (Syringes, Needles) IMPLANT
SYRINGE 70 CC GRADUATE ELONGATE TIP EXTRA LARGE ORIFICE LUER ADAPTER (Syringes, Needles) IMPLANT
SYRINGE MED 20ML LL LF STRL (Syringes, Needles)
SYRINGE MEDLINE 60 ML LID SOFT BULB TIP (Syringes, Needles) ×2
SYRINGE MEDLINE 60 ML LID SOFT BULB TIP IRRIGATION TYVEK (Syringes, Needles) ×2 IMPLANT
SYRINGE MEDLINE 70 CC GRADUATE ELONGATE (Syringes, Needles)
SYSTEM EXTRACTION BALLOON BLUNT TIP (Laparoscopy Supplies)
SYSTEM EXTRACTION BALLOON BLUNT TIP ALEXIS L17 CM 6500 ML (Laparoscopy Supplies) IMPLANT
SYSTEM EXTRACTION SPECIMEN CONTAINMENT (Laparoscopy Supplies)
SYSTEM EXTRACTION SPECIMEN CONTAINMENT BAG BALLOON BLUNT TIP GUARD (Laparoscopy Supplies) IMPLANT
SYSTEM RETRACTOR Z THREAD SLEEVE FIRST (Laparoscopy Supplies)
SYSTEM RETRACTOR Z THREAD SLEEVE FIRST ENTRY OBTURATOR CAP (Laparoscopy Supplies) IMPLANT
SYSTEM XTRT ALEXIS KII 14CM SPEC CNTN BG (Laparoscopy Supplies)
TIP SCISSORS ENDOCUT L19.3 MM (Instrument) IMPLANT
TIP SCT TPR BLBS MDVC YNKR LF STRL DISP (Procedure Accessories) ×2
TIP SUCTION MEDI-VAC YANKAUER TAPER BULBOUS CLEAR (Procedure Accessories) ×2 IMPLANT
TRAP MUCUS SCREW CAP TUBE ID LABEL (Procedure Accessories)
TRAP MUCUS SCREW CAP TUBE ID LABEL MEDLINE PLASTIC CLEAR (Procedure Accessories) IMPLANT
TRAY SRG MINOR LITHOTOMY IFMC (Pack) ×2
TRAY SURGICAL GYN LAP ROYAL ALEXANDRA HOSPITAL (Pack) ×2 IMPLANT
TRAY SURGICAL MINOR LITHOTOMY (Pack) ×2 IMPLANT
TROCAR LAPAROSCOPIC ADVANCE FIXATION (Instrument)
TROCAR LAPAROSCOPIC ADVANCE FIXATION (Procedure Accessories) ×4
TROCAR LAPAROSCOPIC ADVANCE FIXATION SLEEVE FIRST ENTRY L100 MM OD12 (Instrument) IMPLANT
TROCAR LAPAROSCOPIC ADVANCE FIXATION SLEEVE FIRST ENTRY L100 MM OD5 MM (Procedure Accessories) ×4 IMPLANT
WASHCLOTH CLEANSING L8 IN X W8 IN PH (Procedure Accessories) ×2
WASHCLOTH CLEANSING L8 IN X W8 IN PH BALANCE NEEDLE PUNCH (Procedure Accessories) ×2 IMPLANT
WATER STERILE PLASTIC CONTAINER 3000 ML (Irrigation Solutions) IMPLANT

## 2022-12-16 NOTE — Progress Note - Problem Oriented Charting Notewrit (Signed)
I called the pharmacy in [place] to cancel the oxycodone order which pharmacist confirmed, and sent another prescription to [facility] on [address] which is open later.

## 2022-12-16 NOTE — Progress Notes (Addendum)
PACU transfer criteria met. On transfer patient easily arousable. Respirations even and unlabored. IV patent. Vital signs stable. Patient reports pain level as tolerable. Surgical dressing - Abd Dermabond x4, peripad - clean, dry and intact. Patient voided at [place].     Patient brought to Phase 2. Handoff given to receiving RN Sharifah.

## 2022-12-16 NOTE — Op Note (Unsigned)
Procedure Date: 12/16/2022    Patient Type: I    SURGEON: Evelena Leyden. Dazani Norby, MD    PREOPERATIVE DIAGNOSIS  Intra-abdominal adhesions, possible serosal tear.    POSTOPERATIVE DIAGNOSIS:  Intra-abdominal adhesions, possible serosal tear.    TITLE OF PROCEDURE:  Diagnostic laparoscopy.    INDICATIONS FOR PROCEDURE:  This is a female undergoing a myomectomy and unexpectedly was found to have  significant adhesions to a very large fibroid.  General surgery   consultation was obtained.  I scrubbed in.  A 30-degree scope was used.    Dr. Mary Sella, the attending doctor on this case, had dissected away what   appeared to be the sigmoid colon from a very large fibroid and looked at   the bowel and it appeared to be intact without major injury, but perhaps a   small serosal tear. Also draped in a very adherent fashion was her cecum   over the anterior part of the fibroid.  The question that Dr. Mary Sella wanted me  to address was whether or not we could take down this adhesion of the   cecum, and I felt as if technically it would be feasible, but there   certainly would be a risk of enterotomy and/or bowel resection.  Also, the   area posterior to the fibroid would be in the area of the distal sigmoid   colon and rectum and we could not even properly evaluate at this point   based on the mid sigmoid adhesions.  Dr. Mary Sella did tell me that she had   discussed with the patient the possibility that there might be severe   disease in which case, the plan would be to come back another time with   likely bowel resection.  She was comfortable with continuing with this plan  based on the severity of the adhesions and I agreed with that assessment.   We did discuss placing another stitch over the possible serosal tear which   she will perform.  As an added precaution, we did insufflate the rectum   with air and placed fluid in the pelvis and occluded the descending colon   with no bubbles. At this point, I scrubbed out.      D: 12/16/2022 01:15  PM by Evelena Leyden. Acquanetta Belling, MD [phone])  T: 12/16/2022 03:40 PM by [id number] (Conf: [id number]) (Doc ID: [id number])

## 2022-12-16 NOTE — H&P (Signed)
CC: preop    HPI: Linda [name] is a 37 y.o. female  P0010 who presents for planned surgery with dysmenorrhea and heavy cycles. She has always had heavy cycles, often wearing a tampon and pad at the same time, changing her pad every 2 hours. She went to  the ED with painful menses that felt like a stabbing pain in the lower pelvis. She reports debilitating cycles that interfere with quality of life. Also has painful intercourse on deep penetration.   Patient told she had stage 4 endometriosis at last surgery.       H/H: 13.8/41.5  CMP unremarkable  Hcg negative  Urine culture negative.      Previous treatments  OCP's and Nuvaring - has HTN  micronor     Hx of ectopic pregnancy - medical management.   S/p laparoscopy and right oophorectomy for endometriosis. Had subsequent lap myomectomy with mini lap and ablation of endometriosis  Past Medical History:      Past Medical History        Past Medical History:   Diagnosis Date    Anxiety      Depression      Endometriosis      Hypertension              Past Surgical History:      Past Surgical History         Past Surgical History:   Procedure Laterality Date    LAPAROTOMY, SALPINGO-OOPHERECTOMY Left              OB History:      No LMP recorded.  OB History    No obstetric history on file.            Medications       Current Outpatient Medications:     amLODIPine (NORVASC) 10 MG tablet, , Disp: , Rfl:     bisacodyl (DULCOLAX) 10 mg suppository, Place 1 suppository (10 mg) rectally daily as needed for Constipation, Disp: 10 suppository, Rfl: 0    DULoxetine (CYMBALTA) 20 MG capsule, , Disp: , Rfl:     NIFEdipine XL (PROCARDIA XL) 30 MG 24 hr tablet, Take 1 tablet (30 mg) by mouth daily, Disp: , Rfl:     norethindrone (MICRONOR) 0.35 MG tablet, , Disp: , Rfl:     oxyCODONE (ROXICODONE) 5 MG immediate release tablet, Take 1 tablet (5 mg) by mouth every 6 (six) hours as needed for Pain, Disp: 8 tablet, Rfl: 0    polyethylene glycol (MIRALAX) 17 g packet, Take 17 g  by mouth daily as needed (constipation), Disp: 30 each, Rfl: 0    senna-docusate (PERICOLACE) 8.6-50 MG per tablet, Take 1 tablet by mouth nightly, Disp: 30 tablet, Rfl: 0   Allergies:    NKDA    ROS:  Denies CP, SOB, dizziness, palpitations    PE  BP 149/90   Pulse 76   Temp 98.4 F (36.9 C)   Resp 18   LMP 10/27/2022 (Approximate)   SpO2 98%   A&Ox3, NAD  GU: defer to OR    A/P  Plan for hysteroscopic myomectomy, lap myo, possible open with lysis of adhesions, and resection of endometriosis.    Informed consent signed.

## 2022-12-16 NOTE — Anesthesia Postprocedure Evaluation (Signed)
Anesthesia Post Evaluation    Patient: Linda [name]    Procedure(s):  LAPAROSCOPIC, MYOMECTOMY  LAPAROSCOPIC, FULGURATION/EXCISION, ENDOMETRIOSIS  HYSTEROSCOPY, POLYPECTOMY  CYSTOSCOPY    Anesthesia type: general    Last Vitals:   Vitals Value Taken Time   BP 133/84 12/16/22 1615   Temp 37 C (98.6 F) 12/16/22 1546   Pulse 92 12/16/22 1615   Resp 19 12/16/22 1615   SpO2 93 % 12/16/22 1615                 Anesthesia Post Evaluation:     Patient Evaluated: PACU  Patient Participation: complete - patient participated  Level of Consciousness: awake and alert  Pain Score: 3  Pain Management: adequate  Multimodal analgesia pain management approach  Strategies: local anesthesia  Airway Patency: patent  Two or more mitigation strategies used for obstructive sleep apnea.  Strategies: verification of full NMB reversal, awake extubation and multimodal analgesia    Anesthetic complications: No      PONV Status: none    Cardiovascular status: acceptable  Respiratory status: acceptable  Hydration status: acceptable          Signed by: Roxanna Mew, MD, 12/16/2022 4:30 PM

## 2022-12-16 NOTE — Op Note (Signed)
FULL OPERATIVE NOTE    Date Time: 12/16/22 4:43 PM  Patient Name: Linda [name], Linda [name] (MRN: [id number])  Attending Physician: Lindell Noe, MD      Date of Operation:   12/16/2022    Providers Performing:   Surgeon(s) and Role:     * Lindell Noe, MD - Primary     * Duanne Limerick, MD - Fellow     * Linzie Collin, MD - Co-Surgeon    Surgical First Assistant(s):   None    Operative Procedure:   LAPAROSCOPIC, MYOMECTOMYNJ:[id number] (CPT)  LAPAROSCOPIC, FULGURATION/EXCISION, ENDOMETRIOSIS: [phone] (CPT)  HYSTEROSCOPY, POLYPECTOMY: [id number] (CPT)  CYSTOSCOPY: 52000 (CPT)    Preoperative Diagnosis:   Pre-Op Diagnosis Codes:     * Endometriosis [N80.9]     * Uterine leiomyoma, unspecified location [D25.9]    Postoperative Diagnosis:   Post-Op Diagnosis Codes:     * Endometriosis [N80.9]     * Uterine leiomyoma, unspecified location [D25.9]     * Intestinal adhesions [K66.0]    Anesthesia:   general    Findings:   Severe adhesive disease of the posterior uterus to the sigmoid and transverse colon and rectum/  Fibroid uterus  Colon adherent to right fallopian tube and ovary.   5 mm uterine polyp     Indications:   This is a 37 y.o. y/o patient with multiple uterine fibroids and endometriosis with progressive pain and bleeding. We discussed risks, benefits, and alternatives to surgical management, including but not limited to: bleeding, infection, damage to internal organs including bladder, intestines, rectum, ureters, blood vessels and nerves, we discussed open procedure, hysterectomy, blood transfusion. We further discussed that if bowel adhesions were to require bowel resection, we would abort procedure and do 2 stage procedure. We discussed inability to diagnose or cure her pain. She was in agreement with plan fto proceed and consent was signed and witnessed.     Operative Notes:     Procedure: The patient was taken to the operative room where general endotracheal anesthesia was induced without difficulty. She was  placed in lithotomy position with allen stirrups with care to avoid hyperflexion or hyperextension of hips or knees. Her arms were tucked at her sides in Cooper position with elbows and wrists well-padded. Ancef, flagyl, tranexamic acid and cytotec per rectum was placed. Foam was used to to prevent movement in trendelenburg position. She was prepped and draped in the usual sterile fashion. A foley catheter was placed in the bladder.     Sterile speculum was placed in the vagina. Single-tooth tenaculum was placed on the anterior lip of the cervix. The 34mm diagnostic hysteroscope was advanced into the uterine cavity, which was surveyed with the previously mentioned findings. Polyp was grasped with hysteroscopic graspers and removed. All instruments were removed and a V-care was placed inside the uterus.     Palmer's point was infiltrated with 0.25% marcaine. A 5 mm skin incision was made. Direct entry was performed with Opti-view and proper placement confirmed with opening pressure of 5. Inspection below the site of entry was performed with no overt signs of injury. A survey was performed of the abdomen and pelvis with the above stated findings. 3 additional ports were placed in the umbilicus and bilateral lower quadrants under direct visualization.    3 fibroids were identified anteriorly. Vasopressin was injected. The fibroids were shelled out by making an incision over the fibroid with harmonic scalpel and shelling them out with traction and countertraction. The incision  beds were sutured in 2-3 layers with 2.0 Vloc suture until hemostatic.     The posterior aspect of the uterus was completely obliterated by severe adhesive disease the sigmoid and transverse colon were cemented to the posterior uterus where most of patients fibroids are present.     With harmonic scalpel and laparoscopic scissors the sigmoid colon was gently taken down from the uterus. We stayed above the fat plane and dissected where there was a  small clear plane. There was some denudation of serosa. General surgery was consulted intra-op due to severe colonic adhesions.  Please see their note for full details.     The serosa was repaired with a single interrupted 3.0 vicryl.    Cystic lesions consistent with endometriosis were surrounding the right fallopian tube and ovary. This was removed with harmonic scalpel.     A small endometriosis lesion overlying the bladder was excised.     Tisseal was placed all surgical areas and patient appeared to be hemostatic after multiple low pressure checks.     A 12 trocar was replaced at the umbilicus and endocatch back deployed and fibroids removed. The umbilical incision was extended 2 cm and fibroids morcellated. The bag remained intact.     The umbilical fascia was closed with 0 vicryl. A second look was performed with the laparoscope and incisions still remained hemostatic and umbilical incision closed.  The abdomen was evacuated of CO2 gas. The ports were removed.The skin was closed with 4.0 Monocryl.     Cystoscopy was performed which showed no overt injury to the bladder and no endometriotic lesions.     The patient tolerated the procedure well and was awakened and extubated in the the operating room without difficulty . She was taken to the PACU in stable condition.     I was present, scrubbed and actively participated for all portions of this procedure.                Estimated Blood Loss:   250 mL    Implants:   * No implants in log *       Drains:   Drains: no      Specimens:     ID Type Source Tests Collected by Time Destination   A : ENDOMETRIAL POLYP Polyp(s) Endometrial tissue SURGICAL PATHOLOGY Lindell Noe, MD 12/16/2022 1119    B : RIGHT PELVIC SIDE WALL ENDOMETRIOSIS Not Applicable Pelvic SURGICAL PATHOLOGY Lindell Noe, MD 12/16/2022 1227    C : UTERINE FIBROIDS Not Applicable Fibroid SURGICAL PATHOLOGY Lindell Noe, MD 12/16/2022 1232    D : BLADDER ENDOMETRIOSIS Not Applicable Pelvic  SURGICAL PATHOLOGY Lindell Noe, MD 12/16/2022 [id number]          Complications:   None    Signed by: Lindell Noe, MD  [facility]

## 2022-12-16 NOTE — Anesthesia Preprocedure Evaluation (Signed)
Anesthesia Evaluation    AIRWAY    Mallampati: II    Neck ROM: full  Mouth Opening:full   CARDIOVASCULAR    regular and normal       DENTAL    no notable dental hx               PULMONARY    pulmonary exam normal     OTHER FINDINGS                                        Relevant Problems   ANESTHESIA   (+) OSA (obstructive sleep apnea)      PULMONARY   (+) OSA (obstructive sleep apnea)      CARDIO   (+) Primary hypertension       PSS Anesthesia Comments: Endometriosis Anxiety  Depression Hypertension  Sleep apnea Obese    Surgical History     LAPAROSCOPIC, SALPINGO-OOPHORECTOMY LAPAROSCOPIC, EXCISION, ENDOMETRIOSIS  MYOMECTOMY     Substance History             Anesthesia Plan    ASA 3     general                     intravenous induction   Detailed anesthesia plan: general endotracheal      Post Op: plan for postoperative opioid use    Post op pain management: per surgeon    informed consent obtained    Plan discussed with CRNA.    ECG reviewed  pertinent labs reviewed  imaging results reviewed             Signed by: Roxanna Mew, MD 12/16/22 8:55 AM

## 2022-12-16 NOTE — PACU (Signed)
2000: C/o 7/10 abdominal pain. Pain scale rating out of proportion and inconsistent with objective assessment. Respirations, heart rate, BP are all within normal parameters. Muscle tone relaxed. Pt talking on cell phone. Provided teaching r/t reasonable expectations regarding post surgical pain management. PRN Tylenol given as ordered (see MAR). Awaiting father to transport home.  2030: Provided patient/family with discharge instructions and reviewed med reconciliation. Educated on [facility] of infection.  Reviewed accessing MyChart. Discussed MD follow-up. Patient/family verbalized understanding and denied any further questions/concerns. Removed patient's IV, catheter intact.   2052: Patient left with RN transport in wheelchair with all belongings, being discharged home with Father.

## 2022-12-16 NOTE — Transfer of Care (Signed)
Anesthesia Transfer of Care Note    Patient: Linda [name]    Procedures performed: Procedure(s):  LAPAROSCOPIC, MYOMECTOMY  LAPAROSCOPIC, FULGURATION/EXCISION, ENDOMETRIOSIS  HYSTEROSCOPY, POLYPECTOMY  CYSTOSCOPY    Anesthesia type: General ETT    Patient location:Phase I PACU    Last vitals:   Vitals:    12/16/22 1546   BP: 136/78   Pulse: 90   Resp: 22   Temp: 37 C (98.6 F)   SpO2: 99%       Post pain: Patient not complaining of pain, continue current therapy      Mental Status:sedated    Respiratory Function: tolerating face mask    Cardiovascular: stable    Nausea/Vomiting: patient not complaining of nausea or vomiting    Hydration Status: adequate    Post assessment: no apparent anesthetic complications, no reportable events, and no evidence of recall    Signed by: Dwana Curd, CRNA  12/16/22 3:46 PM

## 2022-12-16 NOTE — Discharge Instr - AVS First Page (Addendum)
Patient Discharge Instructions - Post GYN-Laparoscopy  Surgeon: Dr. Mary Sella  Today's Procedure: Laparoscopic myomectomy, excision of endometriosis, lysis of adhesions, cystoscopy    1)  Pain Management  Following surgery, you will need pain medication which has been prescribed and are listed below.  Ideally, you will use the ibuprofen and tylenol together, every 8 hours (or 3 times a day) with food.  You will do this for a week, and then can reduce and stop if you feel well.  Ibuprofen (motrin) 800mg  by mouth every 8 hours for 1 week and then as needed (or Celebrex 200mg  by mouth every 12 hours for 1 week and then as needed for patients with gastritis).  Tylenol (acetaminophen) 500mg  tablets (take 2), every 8 hours for 1 week and then as needed.    If you have moderate or severe pain taking these medications, then you should take a narcotic in addition to these. Ideally, you would only need the narcotic the first couple days.  Try to avoid taking the medications on an empty stomach.               3.   Oxycodone/Tramadol, 1-2 tablets by mouth every 6 hours as needed for severe pain not being managed with ibuprofen/tylenol above.     Additionally, many find an ice pack applied to painful areas comforting for the first 24-48 hours.  After this, a heating pad can be helpful.      2)  Bowel Management  It is OK not to pass gas for 1-2 days after surgery.  The first bowel movement will arrive even later.  It is common to feel "gassy", distended, and uncomfortable.  You will get constipated unless you are careful.  In order to minimize this, please:  Begin taking Miralax as soon as you get home.  This was also prescribed for you.  Do this once or twice every day until you are: off the narcotics AND having normal, soft bowel movements.  You can and should start this prior to surgery if you are already constipated.  Avoid foods that make you gassy like: raw vegetables, beans, and dairy products in the first 48hrs.  If you are  constipated despite the miralax, buy a dulcolax rectal suppository or add senokot tablets to your regimen until you feel better.  If you are still having issues, consider a Fleet's enema.      3) Restrictions:  -Plan to be out of work for 2 weeks.  Occasionally, 3 weeks is necessary.  -Avoid lifting anything heavier than 20 pounds for the first 2 weeks  -Avoid vigorous high impact exercise for 4 weeks  -Avoid placing anything in the vagina for 2 weeks.  If you have any vaginal incisions or had your uterus/cervix removed, wait for 6 weeks.  This includes sex, tampons, douching, pelvic floor physical therapy.  -Avoid driving until you are off the narcotic pain medication and feel you can slam on the brakes without pain.  -Return to your other normal activities in 2 weeks or sooner if you feel able.    -No soaking baths for the first 2 weeks while your incisions are still fresh.      4) Things you can and should do:  -Walk as much as you feel able  -Spend your day out of bed and moving around  -Go up stairs (gently at first)  -Shower and wash your abdominal incisions with mild soapy water daily.  Don't forget to belly button.  Gentle  cleansing with a Q-tip daily is recommended.  -After 2 weeks, if there is still glue on your incisions, coat them daily with Vaseline jelly or another oily substance (coca butter, aquaphor, mineral oil).  This will help to release the glue from your skin for better hygiene.      5)  Things to contact us for:  -Heavy vaginal bleeding (soaking more than 4 pads in a day).  It is normal to have light bleeding for up to 6 weeks.  -Fever greater than 100F  -Pain or nausea which is getting worse rather than better   -Inability to urinate   -Worsening redness or drainage from the incisions  -Any other concerns  In general, you should feel a little better every day that passes.      6) Follow-up appointment  Please make sure that you have an appointment scheduled in the office for 2-3 weeks after  your surgery.    Contact Information:  Office: [phone].  For urgent issues or postop problems, please speak with the nurse or doctor on-call.  For non-urgent issues, use myChart within Epic, your medical record.         Post Anesthesia Discharge Instructions    Although you may be awake and alert in the recovery room, small amounts of anesthetic remain in your system for about 24 hours. You may feel tired and sleepy during this time.    You are advised to go directly home from the hospital.    Plan to stay at home and rest for the remainder of the day.    It is advisable to have someone with you at home for 24 hours after surgery.    Do not operate a motor vehicle, or any mechanical or electrical equipment for the next 24 hours.    Be careful when you are walking around, you may become dizzy. The effects of anesthesia and/or medications are still present and drowsiness may occur.    Do not consume alcohol, tranquilizers, sleeping medications, or any other non prescribed medications for the remainder of the day.    Diet: Begin with liquids, progress your diet as tolerated or as directed by your surgeon. Nausea and vomiting may occur in the next 24 hours.    Oxycodone 5mg  administered at 1650 on December 16, 2022.

## 2022-12-19 ENCOUNTER — Telehealth (INDEPENDENT_AMBULATORY_CARE_PROVIDER_SITE_OTHER): Payer: Self-pay

## 2022-12-19 ENCOUNTER — Encounter: Payer: Self-pay | Admitting: Obstetrics and Gynecology

## 2022-12-19 NOTE — Telephone Encounter (Signed)
Pt contacts nurses line stating that she needed to see a colorectal, and was contacting office to receive referral to schedule an appointment.     Spoke with MD, referral placed in mychart. Informed pt that referral would be sent to Dr. Vertell Limber. Will send over Dr. Melven Sartorius information and referral attached to pt. Patient aware it will be sent over, no further questions.

## 2022-12-19 NOTE — Addendum Note (Signed)
Addended by: Glyn Ade on: 12/19/2022 02:40 PM     Modules accepted: Orders

## 2022-12-20 ENCOUNTER — Other Ambulatory Visit (INDEPENDENT_AMBULATORY_CARE_PROVIDER_SITE_OTHER): Payer: Self-pay | Admitting: Obstetrics and Gynecology

## 2022-12-23 ENCOUNTER — Other Ambulatory Visit (INDEPENDENT_AMBULATORY_CARE_PROVIDER_SITE_OTHER): Payer: Self-pay | Admitting: Family Nurse Practitioner

## 2022-12-26 LAB — LAB USE ONLY - HISTORICAL SURGICAL PATHOLOGY

## 2022-12-27 ENCOUNTER — Encounter (INDEPENDENT_AMBULATORY_CARE_PROVIDER_SITE_OTHER): Payer: Self-pay | Admitting: Obstetrics and Gynecology

## 2022-12-30 ENCOUNTER — Encounter (INDEPENDENT_AMBULATORY_CARE_PROVIDER_SITE_OTHER): Payer: Self-pay | Admitting: Obstetrics and Gynecology

## 2023-01-02 ENCOUNTER — Encounter (INDEPENDENT_AMBULATORY_CARE_PROVIDER_SITE_OTHER): Payer: Commercial Managed Care - POS | Admitting: Obstetrics and Gynecology

## 2023-01-10 ENCOUNTER — Ambulatory Visit (INDEPENDENT_AMBULATORY_CARE_PROVIDER_SITE_OTHER): Payer: Commercial Managed Care - POS | Admitting: Obstetrics and Gynecology

## 2023-01-10 ENCOUNTER — Encounter (INDEPENDENT_AMBULATORY_CARE_PROVIDER_SITE_OTHER): Payer: Self-pay | Admitting: Obstetrics and Gynecology

## 2023-01-10 VITALS — BP 128/88 | HR 89 | Temp 98.6°F | Ht 67.0 in

## 2023-01-10 DIAGNOSIS — D251 Intramural leiomyoma of uterus: Secondary | ICD-10-CM

## 2023-01-10 DIAGNOSIS — N809 Endometriosis, unspecified: Secondary | ICD-10-CM

## 2023-01-10 DIAGNOSIS — Z90721 Acquired absence of ovaries, unilateral: Secondary | ICD-10-CM

## 2023-01-10 MED ORDER — FLUCONAZOLE 150 MG PO TABS
150.0000 mg | ORAL_TABLET | ORAL | 0 refills | Status: AC
Start: 2023-01-10 — End: 2023-01-18

## 2023-01-10 MED ORDER — AMOXICILLIN-POT CLAVULANATE 500-125 MG PO TABS
1.0000 | ORAL_TABLET | Freq: Two times a day (BID) | ORAL | 0 refills | Status: AC
Start: 2023-01-10 — End: 2023-01-15

## 2023-01-10 NOTE — Progress Notes (Signed)
CC: postoperative visit    HPI: Linda [name] is a 37 y.o. who presents to the [facility] for routine postoperative visit on 01/10/2023    Patient underwent the following procedure on this date: lap myomectomy x 3, lysis of adhesions, excision of endometriosis, hysteroscopy, polypectomy and cystoscopy     Indications for the procedure: pelvic pain, heavy bleeding    Operative Findings:     Pathology showed:   A.  ENDOMETRIUM, POLYPECTOMY:           -  BENIGN ENDOMETRIAL POLYP.        B.  RIGHT PELVIC SIDEWALL PERITONEUM, BIOPSY:           -  VASCULAR CONGESTION AND MESOTHELIAL HYPERPLASIA.           -  NO ENDOMETRIOSIS IDENTIFIED.        C.  UTERUS, MYOMECTOMY:           -  LEIOMYOMA.     Since surgery patient reports  Patient overall doing well without any issues at this time.   Abdominal pain: no  Incisional pain and drainage: no  Problems with bowel/bladder function: no  Vaginal bleeding/discharge: some spotting    Review of Systems -- 5 systems reviewed and are otherwise negative or non-contributory    Past medical, surgical, family and social histories were reviewed, and Epic was updated accordingly.     No Known Allergies    PHYSICAL EXAM  BP 128/88   Pulse 89   Temp 98.6 F (37 C)   Ht 1.702 m (5\' 7" )   BMI 38.87 kg/m   General: No acute distress  Abdomen: soft, non-tender/non-distended, incisions well healing, umbilical incision almost fully healed. Redness with possible cellulitis.     Assessment   A/P: Linda [name] is a 37 y.o. who is approximately 2 weeks postop. Patient is doing well and had a normal postoperative check today.   Patient to maintain pelvic rest x 2 weeks.   Rx for augmentin sent. Diflucan for yeast prophylaxis sent.   Operative report was reviewed at today's visit.     S/p colorectal consult. Follow up with them in 1 month for scope.  Had a long discussion with patient about operative findings and fertility.   She would like to meet with REI to  determine likelihood of pregnancy or if she is a candidate for egg retrieval.  We discussed hysterectomy vs another myomectomy, risks and limitations versus benefits of either procedure.   She will meet with REI and let me know her decision.     Billing based on medical complexity.   Devota Pace, MD,

## 2023-01-26 ENCOUNTER — Encounter (INDEPENDENT_AMBULATORY_CARE_PROVIDER_SITE_OTHER): Payer: Self-pay | Admitting: Obstetrics and Gynecology

## 2023-05-23 ENCOUNTER — Encounter (INDEPENDENT_AMBULATORY_CARE_PROVIDER_SITE_OTHER): Payer: Self-pay

## 2023-05-30 ENCOUNTER — Telehealth: Payer: Self-pay | Admitting: Sleep Medicine

## 2023-05-30 NOTE — Telephone Encounter (Signed)
Attempted to contact pt to obtain any previous sleep studies or CPAP compliance. Pt did not answer.

## 2023-05-31 ENCOUNTER — Ambulatory Visit (INDEPENDENT_AMBULATORY_CARE_PROVIDER_SITE_OTHER): Payer: Commercial Managed Care - POS | Admitting: Sleep Medicine

## 2023-05-31 ENCOUNTER — Encounter (INDEPENDENT_AMBULATORY_CARE_PROVIDER_SITE_OTHER): Payer: Self-pay | Admitting: Sleep Medicine

## 2023-05-31 VITALS — BP 114/77 | HR 70 | Temp 98.4°F | Resp 16 | Ht 67.0 in | Wt 213.0 lb

## 2023-05-31 DIAGNOSIS — G4733 Obstructive sleep apnea (adult) (pediatric): Secondary | ICD-10-CM

## 2023-05-31 DIAGNOSIS — G478 Other sleep disorders: Secondary | ICD-10-CM

## 2023-05-31 DIAGNOSIS — G4719 Other hypersomnia: Secondary | ICD-10-CM

## 2023-05-31 NOTE — Progress Notes (Signed)
SLEEP DISORDERS CLINIC    SLEEP MEDICINE VISIT    Date of Clinic Appointment: May 31, 2023    Linda [name]  [id number]  03-Feb-1986  [address]  [address]  [place] [address]    Primary Care Provider:  Pcp, None, MD  No address on file  None   None    Referring Provider:  No ref. provider found  No referring provider defined for this encounter.      Chief Complaint   Excessive daytime sleepiness. Sleep apnea.    History of Present Illness     Reason for Visit: Linda [name] is a 37 y.o. female with anxiety, depression, HTN, obesity, who presents for a consultation regarding symptoms concerning for sleep apnea.    Onset of EDS around 37 yr old, insidious. Also developed recurring sleep paralysis at that time.  In her 20's, also started Snore loud, restless sleep, unrefreshed in the morning on waking  Sleep study HST 2 yrs ago severe per her report. Results not available  Tried CPAP in [place]. Had significant difficulty tolerating the CPAP pressures. Could not exhale against the pressure. Tried multiple masks - nasal pillow uncomfortable. UTN FFM too claustrophobic.  Moved from [facility]. Changed insurance  50 lbs weight loss, semaglutide  Since wt loss, snoring better but still restless and unrefreshed      They endorse the following symptoms:  [x]  Habitual snoring  []  Apneas witnessed by bed partner  [x]  Awakens feeling unrefreshed by sleep  [x]  Excessive daytime sleepiness  []  Excessive daytime fatigue  []  Restlessness during sleep  []  Sudden awakenings with a sensation of gasping or choking  []  Dry mouth or sore throat upon awakening  []  Intellectual impairment, such as trouble concentrating, forgetfulness, or irritability  []  Frequent urination at night  []  Night sweats  []  Morning headaches      Bedtime: 9p-10p  Sleep latency: very fast  Nocturnal arousals: 0-1 arousals (3 arousals in a 7 day week). May wake up 3a and be up 3a-5a then go back to sleep for an hour and get up  [facility] time: 6a  Daytime  hypersomnia: yes  Naps: 3 times/wk, on days she works from home. Naps can be in morning or at lunch.  Drowsy driving: yes but no near miss or accidents.     She endorses sleep paralysis. Recurrent. First started in college in early 20's  Deneis hypnagogic/pompic halluc  Denies cataplexy  She states she can start dreaming fast after falling asleep. Can dream during nap.      EPWORTH SLEEPINESS SCALE    How Likely Are You to Fall Asleep    Sitting and reading?      1- Slight chance of dozing or sleeping  Watching TV?       3 - High chance of dozing or sleeping  Sitting inactive in a public place?    1- Slight chance of dozing or sleeping  As a passenger in a car for an hour or more?  3 - High chance of dozing or sleeping  Lying down to rest in the afternoon?    3 - High chance of dozing or sleeping  Sitting and talking to someone?    2 - Moderate chance of dozing or sleeping  Sitting quietly after lunch without alcohol?   0 - Never doze or sleep  Stopped for a few minutes at a traffic light?   1- Slight chance of dozing or sleeping  Total Score       14   Score 0-9 = Not sleepy   Score 10-17  = Moderate sleepy   Score 18+  = Very sleepy    STOP-BANG QUESTIONNAIRE  3      Ancillary Data:  Her previous sleep studies and medical records were reviewed with the patient.     PRIOR SLEEP TESTING:   HST      Past Medical History     Medical History[1]      Medications     Current Outpatient Medications on File Prior to Visit   Medication Sig Dispense Refill    amLODIPine (NORVASC) 10 MG tablet Take 1 tablet (10 mg) by mouth nightly      cyclobenzaprine (FLEXERIL) 5 MG tablet TAKE 1 TABLET (5 MG) BY MOUTH NIGHTLY MAY CUT TABLET IN HALF IF TOO SEDATING. 30 tablet 1    DULoxetine (CYMBALTA) 20 MG capsule       hydroCHLOROthiazide (MICROZIDE) 12.5 MG capsule Take 1 capsule (12.5 mg) by mouth daily 30 capsule 0    ibuprofen (ADVIL) 600 MG tablet Take 1 tablet (600 mg) by mouth every 6 (six) hours as needed for Pain 30 tablet 1     norethindrone (AYGESTIN) 5 MG tablet TAKE 1 TABLET BY MOUTH EVERY DAY 90 tablet 1    oxyCODONE (ROXICODONE) 5 MG immediate release tablet Take 1 tablet (5 mg) by mouth every 6 (six) hours as needed for Pain Use in addition to the tylenol and ibuprofen for the first few days after surgery. 18 tablet 0    SEMAGLUTIDE-WEIGHT MANAGEMENT SC Inject into the skin      venlafaxine (EFFEXOR) 75 MG tablet Take 2 tablets (150 mg) by mouth daily (Patient not taking: Reported on 01/10/2023)      vitamin D, ergocalciferol, (DRISDOL) 50000 UNIT Cap Take by mouth once a week (Patient not taking: Reported on 01/10/2023)       No current facility-administered medications on file prior to visit.          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    HEENT: Mallampati 4. Dentition good. Retrognathia was not present. Neck Circumference 14.25 inches.    Neurologic: She is awake, alert, and oriented x 3.      Psychiatric: Alert, interactive, and appropriate mood and affect normal        Data Review       Laboratory Studies:   Lab Results   Component Value Date    WBC 6.23 12/16/2022    HGB 12.7 12/16/2022    HCT 39.3 12/16/2022    PLT 364 (H) 12/16/2022    ALT 13 10/21/2022    AST 18 10/21/2022    NA 138 10/21/2022    K 3.7 10/21/2022    CL 106 10/21/2022    BUN 7.0 10/21/2022    CO2 21 10/21/2022    HGBA1C 5.7 (H) 12/01/2022     Lab Results   Component Value Date    FERRITIN 129.80 12/13/2021  IRON 17 (L) 12/13/2021    TIBC 251 (L) 12/13/2021             Assessment       Linda [name] is a 38 y.o. presents for evaluation of:    Excessive daytime sleepiness  This can be from OSA. But also entertain  possible diagnosis of narcolepsy given her EDS daily since age 89 with associated recurrent sleep paralysis.  OSA, history of severe  History of cpap intolerance 2 yrs ago despite multiple mask trials  Lost 50 lbs. Will re-evaluate apnea severity after 50 lb wt loss  We discussed CPAP-to-BPAP titration see if we can find her optimal pressures or BPAP may be better tolerated bc of her difficulty exhaling against the pressure on cpap  Also discussed alternative such as iNAP, OAT, Inspire, ENT surgery.  Recurrent sleep paralysis  In setting of EDS I am considering narcolepsy, though will normalize her OSA first and see the response  There is no cataplexy  Reviewed sleep paralysis causes, physiology, and gave reassurance. Answered her questions about the condition.        We discussed the option of home sleep apnea testing versus nocturnal polysomnogram.  The patient would prefer to get the sleep study  at home.  I have entered the referral for  HST   for further evaluation.  The patient understands that in the case insurance does not approve the sleep study in the sleep center then we may have to do home sleep apnea testing. The patient understands that home sleep apnea testing may underestimate the severity of underlying sleep disorder.     Weight loss: We discussed the relationship of body weight and obstructive sleep apnea severity and how changes in body weight could affect sleep apnea severity. I counseled the patient on the benefits of weight loss on sleep apnea and its related symptoms. I recommend weight loss to goal BMI <= 25.      - Discussed avoid driving or engaging in operating heavy machinery if drowsy.    The patient will follow-up with [place] after sleep study to discuss results and further treatment plan.      It is a pleasure to participate in the care of this patient.  Please feel free to contact me with any questions or concerns.    Sincerely,       Terri Skains, MD    [facility] Sleep Disorders  Program  [facility] Neurology  Board Certified, Neurology  Board Certified, Sleep Medicine           The total time spent on the day of service in preparing to see the patient: chart review, obtaining and/or reviewing the separately obtained history, performing a medically appropriate examination and/or evaluation , counseling and educating the patient/family/caregiver, ordering medications, tests, or procedures, and documenting clinical information in the electronic or other health records was 49 minutes.           [1]   Past Medical History:  Diagnosis Date    Anxiety     Depression     Endometriosis     Hypertension     Obese     Sleep apnea

## 2023-05-31 NOTE — Patient Instructions (Signed)
I have ordered a home sleep test to assess your sleep complaints. Please call our call center to schedule your sleep test.    The centralized number is [phone].    We have three sleep lab locations:   [facility]    Then results should come to you on mychart within 7 days.

## 2023-06-12 ENCOUNTER — Ambulatory Visit: Payer: Commercial Managed Care - POS | Attending: Sleep Medicine

## 2023-06-12 DIAGNOSIS — N809 Endometriosis, unspecified: Secondary | ICD-10-CM | POA: Insufficient documentation

## 2023-06-12 DIAGNOSIS — F411 Generalized anxiety disorder: Secondary | ICD-10-CM | POA: Insufficient documentation

## 2023-06-12 DIAGNOSIS — L0390 Cellulitis, unspecified: Secondary | ICD-10-CM | POA: Insufficient documentation

## 2023-06-12 DIAGNOSIS — I10 Essential (primary) hypertension: Secondary | ICD-10-CM | POA: Insufficient documentation

## 2023-06-12 DIAGNOSIS — R7303 Prediabetes: Secondary | ICD-10-CM | POA: Insufficient documentation

## 2023-06-12 DIAGNOSIS — R4589 Other symptoms and signs involving emotional state: Secondary | ICD-10-CM | POA: Insufficient documentation

## 2023-06-12 DIAGNOSIS — G4733 Obstructive sleep apnea (adult) (pediatric): Secondary | ICD-10-CM | POA: Insufficient documentation

## 2023-06-12 DIAGNOSIS — D259 Leiomyoma of uterus, unspecified: Secondary | ICD-10-CM | POA: Insufficient documentation

## 2023-06-14 ENCOUNTER — Telehealth: Payer: Self-pay

## 2023-06-14 NOTE — Telephone Encounter (Signed)
Patient was issued a HST on 06/12/23.  Device was not returned the following business day 06/13/23.  Patient was called and informed of possible late charges due to device not being returned.  Patient stated that she would not be able to return the HST today, but would bring back on Thursday.

## 2023-06-15 NOTE — Progress Notes (Signed)
HST ready for interpretation.

## 2023-06-21 ENCOUNTER — Encounter (INDEPENDENT_AMBULATORY_CARE_PROVIDER_SITE_OTHER): Payer: Self-pay | Admitting: Sleep Medicine

## 2023-06-21 NOTE — Progress Notes (Signed)
Home Sleep Study Report     Date of study: 06/13/2023    Referring Clinician:  Terri Skains, MD     Indication: History of OSA status post weight loss. Evaluate current apnea severity.     Scoring criteria:  Hypopnea scored based on 3% desaturation or arousal    Summary of findings:     The total recording time (TRT) was 9 hours and 5 minutes, with a total sleep time (TST) of 8 hours and 2 minutes. The percent of time spent in REM sleep was 15.2%. The apnea-hypopnea index (AHI) was 14.3/hr, REM AHI was 18.3/hr, and NREM AHI was 13.7/hr. The respiratory disturbance index (RDI) was 26.2/hr. The oxygen desaturation index was 13.6/hr. The minimum oxygen saturation was 86%, with a mean oxygen saturation of 94%. The mean pulse rate was 72 BPM. The patient slept in supine, prone, and lateral positions. Supine sleep accounted for 77.1% of total sleep. The supine AHI was 15.7/hr, and non-supine AHI was 9.2/hr. Snoring > 40 dB was noted for 34.6% of the recording.    Impression:     The findings are consistent with mild obstructive sleep apnea (AHI 14.3/hr).  This degree of sleep apnea can be associated with excessive daytime sleepiness.  A trial of PAP therapy could be considered for symptomatic management. If so, consider Auto-titrating PAP 5-20 cwp.   If the patient does not desire PAP or cannot tolerate PAP, oral appliance therapy (OAT), nasal EPAP device [facility]), or neuromuscular electrical stimulation (eXcite OSA) can be considered.  For all patients with sleep apnea, weight loss is recommended to a goal BMI < 25 to reduce apnea severity and symptoms.        Terri Skains, MD  Board Certified, Neurology  Board Certified, Sleep Medicine  [facility]

## 2023-06-27 ENCOUNTER — Encounter (INDEPENDENT_AMBULATORY_CARE_PROVIDER_SITE_OTHER): Payer: Self-pay | Admitting: Sleep Medicine

## 2023-06-27 ENCOUNTER — Telehealth (INDEPENDENT_AMBULATORY_CARE_PROVIDER_SITE_OTHER): Payer: Commercial Managed Care - POS | Admitting: Sleep Medicine

## 2023-06-27 VITALS — Ht 67.0 in | Wt 205.0 lb

## 2023-06-27 DIAGNOSIS — G4733 Obstructive sleep apnea (adult) (pediatric): Secondary | ICD-10-CM

## 2023-06-27 DIAGNOSIS — G478 Other sleep disorders: Secondary | ICD-10-CM

## 2023-06-27 DIAGNOSIS — G4719 Other hypersomnia: Secondary | ICD-10-CM

## 2023-06-27 NOTE — Patient Instructions (Signed)
I have ordered an in-lab sleep test to assess your sleep complaints. Please call our call center to schedule your sleep test.    The centralized number is [phone].    We have three sleep lab locations:   [address] [place]

## 2023-06-27 NOTE — Progress Notes (Signed)
SLEEP DISORDERS CLINIC    SLEEP MEDICINE VISIT    Date of Clinic Appointment: June 27, 2023    Linda [name]  [id number]  Oct 25, 1985  [address]  [address]  [place] [address]    Primary Care Provider:  Pcp, None, MD  No address on file  None   None    Referring Provider:  No ref. provider found  No referring provider defined for this encounter.      Chief Complaint   Excessive daytime sleepiness. Sleep apnea.    History of Present Illness   Interval History 06/27/2023  No change in sleep habits or symptoms. Her problem remains daily excessive daytime sleepiness.  For example, last night she went to sleep 8:30p-3a, 5a-7a but still feels sleepy today after waking. She wants to go back to sleep.  She gets sleepy and starts napping by mid-day most every day.    Home Sleep Study Report     Date of study: 06/13/2023     Referring Clinician:  Terri Skains, MD     Indication: History of OSA status post weight loss. Evaluate current apnea severity.     Scoring criteria:  Hypopnea scored based on 3% desaturation or arousal     Summary of findings:     The total recording time (TRT) was 9 hours and 5 minutes, with a total sleep time (TST) of 8 hours and 2 minutes. The percent of time spent in REM sleep was 15.2%. The apnea-hypopnea index (AHI) was 14.3/hr, REM AHI was 18.3/hr, and NREM AHI was 13.7/hr. The respiratory disturbance index (RDI) was 26.2/hr. The oxygen desaturation index was 13.6/hr. The minimum oxygen saturation was 86%, with a mean oxygen saturation of 94%. The mean pulse rate was 72 BPM. The patient slept in supine, prone, and lateral positions. Supine sleep accounted for 77.1% of total sleep. The supine AHI was 15.7/hr, and non-supine AHI was 9.2/hr. Snoring > 40 dB was noted for 34.6% of the recording.     Impression:     The findings are consistent with mild obstructive sleep apnea (AHI 14.3/hr).      Retained from initial visit:    Reason for Visit: Linda [name] is a 37 y.o. female with  anxiety, depression, HTN, obesity, who presents for a consultation regarding symptoms concerning for sleep apnea.    Onset of EDS around 37 yr old, insidious. Also developed recurring sleep paralysis at that time.  In her 20's, also started Snore loud, restless sleep, unrefreshed in the morning on waking  Sleep study HST 2 yrs ago severe per her report. Results not available  Tried CPAP in [place]. Had significant difficulty tolerating the CPAP pressures. Could not exhale against the pressure. Tried multiple masks - nasal pillow uncomfortable. UTN FFM too claustrophobic.  Moved from [facility]. Changed insurance  50 lbs weight loss, semaglutide  Since wt loss, snoring better but still restless and unrefreshed      They endorse the following symptoms:  [x]  Habitual snoring  []  Apneas witnessed by bed partner  [x]  Awakens feeling unrefreshed by sleep  [x]  Excessive daytime sleepiness  []  Excessive daytime fatigue  []  Restlessness during sleep  []  Sudden awakenings with a sensation of gasping or choking  []  Dry mouth or sore throat upon awakening  []  Intellectual impairment, such as trouble concentrating, forgetfulness, or irritability  []  Frequent urination at night  []  Night sweats  []  Morning headaches      Bedtime: 9p-10p  Sleep  latency: very fast  Nocturnal arousals: 0-1 arousals (3 arousals in a 7 day week). May wake up 3a and be up 3a-5a then go back to sleep for an hour and get up  [facility] time: 6a  Daytime hypersomnia: yes  Naps: 3 times/wk, on days she works from home. Naps can be in morning or at lunch.  Drowsy driving: yes but no near miss or accidents.     She endorses sleep paralysis. Recurrent. First started in college in early 20's  Deneis hypnagogic/pompic halluc  Denies cataplexy  She states she can start dreaming fast after falling asleep. Can dream during nap.      EPWORTH SLEEPINESS SCALE    How Likely Are You to Fall Asleep    Sitting and reading?      1- Slight chance of dozing or sleeping  Watching TV?       3  - High chance of dozing or sleeping  Sitting inactive in a public place?    1- Slight chance of dozing or sleeping  As a passenger in a car for an hour or more?  3 - High chance of dozing or sleeping  Lying down to rest in the afternoon?    3 - High chance of dozing or sleeping  Sitting and talking to someone?    2 - Moderate chance of dozing or sleeping  Sitting quietly after lunch without alcohol?   0 - Never doze or sleep  Stopped for a few minutes at a traffic light?   1- Slight chance of dozing or sleeping    Total Score       14   Score 0-9 = Not sleepy   Score 10-17  = Moderate sleepy   Score 18+  = Very sleepy    STOP-BANG QUESTIONNAIRE  3      Past Medical History     Past Medical History:   Diagnosis Date    Anxiety     Depression     Endometriosis     Hypertension     Obese     Sleep apnea          Medications     Current Outpatient Medications on File Prior to Visit   Medication Sig Dispense Refill    amLODIPine (NORVASC) 10 MG tablet Take 1 tablet (10 mg) by mouth nightly      cyclobenzaprine (FLEXERIL) 5 MG tablet TAKE 1 TABLET (5 MG) BY MOUTH NIGHTLY MAY CUT TABLET IN HALF IF TOO SEDATING. 30 tablet 1    DULoxetine (CYMBALTA) 20 MG capsule       hydroCHLOROthiazide (MICROZIDE) 12.5 MG capsule Take 1 capsule (12.5 mg) by mouth daily 30 capsule 0    ibuprofen (ADVIL) 600 MG tablet Take 1 tablet (600 mg) by mouth every 6 (six) hours as needed for Pain 30 tablet 1    norethindrone (AYGESTIN) 5 MG tablet TAKE 1 TABLET BY MOUTH EVERY DAY 90 tablet 1    oxyCODONE (ROXICODONE) 5 MG immediate release tablet Take 1 tablet (5 mg) by mouth every 6 (six) hours as needed for Pain Use in addition to the tylenol and ibuprofen for the first few days after surgery. 18 tablet 0    SEMAGLUTIDE-WEIGHT MANAGEMENT SC Inject into the skin      venlafaxine (EFFEXOR) 75 MG tablet Take 2 tablets (150 mg) by mouth daily (Patient not taking: Reported on 01/10/2023)      vitamin D, ergocalciferol, (DRISDOL) 50000 UNIT Cap Take by  mouth once a week (Patient not taking: Reported on 01/10/2023)       No current facility-administered medications on file prior to visit.          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      Constitutional: She is well-nourished, well-appearing, and in no acute distress.    HEENT: Mallampati 4. Dentition good. Retrognathia was not present. Neck Circumference 14.25 inches.    Neurologic: She is awake, alert, and oriented x 3.      Psychiatric: Alert, interactive, and appropriate mood and affect normal        Data Review       Laboratory Studies:   Lab Results   Component Value Date    WBC 6.23 12/16/2022    HGB 12.7 12/16/2022    HCT 39.3 12/16/2022    PLT 364 (H) 12/16/2022    ALT 13 10/21/2022    AST 18 10/21/2022    NA 138 10/21/2022    K 3.7 10/21/2022    CL 106 10/21/2022    BUN 7.0 10/21/2022    CO2 21 10/21/2022    HGBA1C 5.7 (H) 12/01/2022     Lab Results   Component Value Date    FERRITIN 129.80 12/13/2021    IRON 17 (L) 12/13/2021    TIBC 251 (L) 12/13/2021             Assessment       Linda [name] is a 37 y.o. presents for evaluation of:    Excessive daytime sleepiness  This can be from OSA. But also entertain possible diagnosis of narcolepsy given her EDS daily since age 26 with associated recurrent sleep paralysis.  OSA, Mild  History of APAP intolerance 2 yrs ago despite a good faith effort and multiple mask trials (at least 3 masks). She specifically states she cannot tolerate exhaling against the pressures on the APAP machine. She doesn't want to do APAP again because "why would I do the same thing I didn't tolerate?"  We discussed CPAP-to-BPAP titration see if we can find her optimal pressures or BPAP may be better tolerated. She is in agreement with titration study or directly starting on BPAP.  Insurance coverage will not likely allow directly starting on BPAP so I will order the CPAP to BPAP titration study since she has failed APAP.  Recurrent sleep paralysis  In setting of EDS I am considering narcolepsy, though will normalize her OSA first and see the response  There is no cataplexy      Weight loss: We discussed the relationship of body weight and obstructive sleep apnea severity and how changes in body weight could affect sleep apnea severity. I counseled the patient on the benefits of weight loss on sleep apnea and its related symptoms. I recommend weight loss to goal BMI <= 25.      - Discussed avoid driving or engaging in operating heavy machinery if drowsy.       It is a pleasure to participate in the care of this patient.  Please feel free to contact me with any questions or concerns.    Sincerely,       Terri Skains, MD    [facility]  [facility]  Board Certified, Neurology  Board Certified, Sleep Medicine           The total time spent on the day of service in preparing to see the patient: chart review, obtaining and/or reviewing the separately obtained history, performing a medically appropriate examination and/or evaluation , counseling and educating the patient/family/caregiver, ordering medications, tests, or procedures, and documenting clinical information in the electronic or other health records was 30 minutes.

## 2023-07-12 ENCOUNTER — Encounter: Payer: Self-pay | Admitting: Sleep Medicine

## 2023-07-22 ENCOUNTER — Ambulatory Visit: Payer: Commercial Managed Care - POS | Attending: Sleep Medicine

## 2023-07-22 DIAGNOSIS — G4733 Obstructive sleep apnea (adult) (pediatric): Secondary | ICD-10-CM | POA: Insufficient documentation

## 2023-07-23 NOTE — Progress Notes (Signed)
Patient: Linda [name]     MRN#: [id number]     Date: 07/23/2023        Time: 5:06 AM    Cherie Dark Doretta Mockbee is a 37 y.o. female scheduled for CPAP sleep study. Referring physician's order reviewed. Patient was oriented to the sleep lab environment and emergency fire exit route. Individualized sleep education provided to the patient. All questions answered. Plan of care for the entire patient stay was explained and patient was oriented and encouraged to use call bell and ask questions at any point during the study.    Patient displayed moderate/severe respiratory events, worse when supine.  Patient slept supine & lateral (body positions).   Snoring was moderate, and N1, N2, N3 & REM stages of sleep were reached.  Periodic leg movement syndrome (PLMS)/ Restless leg syndrome were: Yes  Minimum oxygen saturation (= Oxygen -NADIR): how much ? 78%  EKG was Normal.      Patient is able to drive safely after being discharged from the sleep lab: Yes    Did the patient received a TITRATION STUDY: YES    Which titration mode at the end of the study: CPAP   Respiratory events were attenuated at 5 cmH2O.  Snoring was attenuated at 6 cmH2O.  Could patient sleep and got REM sleep on supine?  Yes  How much the O2-NADIR at the end of the test:  84%  Oxygen supplement was applied? No    Which mask was fitted: full Horticulturist, commercial F-30i of RESMED   Size of the mask: medium, cushion Small/Wide  Cold  humidifier was applied ? Yes    Final setting 8 cm H2O

## 2023-08-01 ENCOUNTER — Encounter (INDEPENDENT_AMBULATORY_CARE_PROVIDER_SITE_OTHER): Payer: Self-pay | Admitting: Sleep Medicine

## 2023-08-01 NOTE — Progress Notes (Signed)
Continuous Positive Airway Pressure (CPAP) Titration    Study Date: 07/22/2023    Referring Clinician:  Terri Skains, MD    History:  This is an overnight polysomnogram with titration of positive airway pressure (PAP) on this 37 year old patient diagnosed with mild OSA. She has history of intolerance to APAP. CPAP titration performed to identify optimal treatment pressure.    A previous home sleep study done on 06/13/2023 revealed mild obstructive sleep apnea (OSA) with an overall apnea-hypopnea index (AHI) of 14.3/hr.    Scoring criteria: Hypopneas scored by 3% or arousal rule    Report:  During this titration study, CPAP pressures between 4 cwp and 8 cwp were used. The total recording time (TRT) was 545.5 minutes, and the total sleep time (TST) was 502 minutes, resulting in a sleep efficiency of 92%. The sleep latency was 3.5 minutes, wake after sleep onset (WASO) was 40 minutes, and the REM latency was 73 minutes. The overall AHI was 12.2/hr, REM AHI was 17/hr, NREM AHI was 9.3/hr, supine AHI was 7.1/hr, and non-supine AHI was 18.1/hr.     For the overall study, the apnea/hypopnea breakdown was as follows: 0 obstructive apneas, 0 mixed apneas, 11 central apneas, and 91 hypopneas. During sleep, the mean SaO2 was 95.5% with an SaO2 nadir of 80%. The percentage and minutes of TST with an SaO2 <= 88% were 4.2% and 22.4 minute(s), respectively. There was some oximeter dysfunction contributing to this time spent less than 88%. The desaturation index was 10.9/hr. The arousal index was 9.6/hr. Supplemental oxygen was not utilized during the study. The overall periodic limb movement index was 1.1/hr with a periodic limb movement arousal index of 0/hr. The single-lead EKG showed an average pulse rate of 70.3/min. A regular, narrow complex rhythm was observed. A few PVC's were seen.    Interpretation:  This PAP titration polysomnogram demonstrates that:  1) This was an inadequate CPAP titration. At CPAP pressures up to 8 cwp,  snoring and moderate obstructive respiratory events persisted, especially in REM sleep.  2) Despite this failure to control obstructive events (all were hypopneas), sleep appeared consolidated, with high sleep efficiency (92%). The patient rated her sleep on post-PSG survey as "longer than usual" and "better than usual."    Clinical Correlation:  1) CPAP titration demonstrated improvement in the patient's sleep quality but did not control the patient's respiratory events. Higher pressures appear to be needed.  2) Recommend treatment with CPAP pressure of 10 cwp OR autotitrating PAP 5 cwp - 12 cwp with close clinical follow-up.  3) During this study, the patient utilized a ResMed F30i full face mask (size unspecified).  4) The patient will be seen in sleep clinic to discuss further management.    Terri Skains, MD  Board Certified, Neurology  Board Certified, Sleep Medicine  [facility]

## 2023-08-07 ENCOUNTER — Ambulatory Visit: Payer: Commercial Managed Care - POS | Attending: Family Nurse Practitioner | Admitting: Family Nurse Practitioner

## 2023-08-07 ENCOUNTER — Encounter: Payer: Self-pay | Admitting: Family Nurse Practitioner

## 2023-08-07 VITALS — Ht 67.0 in | Wt 204.0 lb

## 2023-08-07 DIAGNOSIS — G478 Other sleep disorders: Secondary | ICD-10-CM

## 2023-08-07 DIAGNOSIS — G4733 Obstructive sleep apnea (adult) (pediatric): Secondary | ICD-10-CM

## 2023-08-07 DIAGNOSIS — G4719 Other hypersomnia: Secondary | ICD-10-CM

## 2023-08-07 NOTE — Progress Notes (Signed)
autoCPAP 5-12 order submitted to [facility] via [facility].

## 2023-08-07 NOTE — Progress Notes (Signed)
 08/07/2023    Verbal consent has been obtained from the patient to conduct a telemedicine visit encounter to minimize exposure to COVID-19: yes.    A two-way, synchronous, real- time, audio and visual interactive communication system between the patient and

## 2024-09-08 ENCOUNTER — Ambulatory Visit (FREE_STANDING_LABORATORY_FACILITY)

## 2024-09-08 ENCOUNTER — Encounter (INDEPENDENT_AMBULATORY_CARE_PROVIDER_SITE_OTHER): Payer: Self-pay

## 2024-09-08 VITALS — BP 131/85 | HR 80 | Temp 98.6°F | Resp 16 | Ht 67.0 in | Wt 203.0 lb

## 2024-09-08 DIAGNOSIS — B3731 Acute candidiasis of vulva and vagina: Secondary | ICD-10-CM

## 2024-09-08 DIAGNOSIS — J029 Acute pharyngitis, unspecified: Secondary | ICD-10-CM

## 2024-09-08 DIAGNOSIS — H66002 Acute suppurative otitis media without spontaneous rupture of ear drum, left ear: Secondary | ICD-10-CM

## 2024-09-08 LAB — STREP A POCT GOHEALTH: Rapid Strep A Screen POCT: NEGATIVE

## 2024-09-08 LAB — POCT INFLUENZA A/B
POCT Rapid Influenza A AG: NEGATIVE
POCT Rapid Influenza B AG: NEGATIVE

## 2024-09-08 LAB — POC COVID QUICKVUE ANTIGEN: QuickVue SARS COV2 Antigen POCT: NEGATIVE

## 2024-09-08 NOTE — Progress Notes (Signed)
 [facility]  OFFICE NOTE         Subjective   Historian: Patient      Chief Complaint   Patient presents with    Sore Throat     Since yesterday sore throat, cough, nasal congestion, mucus while cough. Rx OTC medx        History of Present Illness  Linda [name] is a 38 year old female with hypertension who presents with sore throat and fatigue.    She has had sore throat and fatigue since yesterday. Throat pain was 8/10 and improved to 5-6/10 after drinking tea, though it remains irritated and potted.    Since onset she has had nasal drainage, yellow phlegm, and cough. She denies nausea, vomiting, and shortness of breath.    She has left ear pain rated 4-5/10 and mild difficulty swallowing.    She is taking over-the-counter Coricidin. She has hypertension.    History:  Medications and Allergies reviewed.   Pertinent Past Medical, Surgical, Family and Social History were reviewed.        Objective     Vitals:    09/08/24 0926   BP: 131/85   Pulse: 80   Resp: 16   Temp: 98.6 F (37 C)   TempSrc: Tympanic   SpO2: 98%   Weight: 92.1 kg (203 lb)   Height: 1.702 m (5' 7)      Body mass index is 31.79 kg/m.          Physical Exam   Physical Exam  HEENT: Left ear infection  Urgent Care Course   There were no labs reviewed with this patient during the visit.    There were no x-rays reviewed with this patient during the visit.      Procedures   Procedures     Assessment / Plan     Differential Diagnoses including but not limited to: AOM, AOE, FB, TM PERF, VIRAL, EUSTACHIAN TUBE DYSFUNCTION      Assessment & Plan  A 38 year old woman with a history of hypertension presented with one day of sore throat, fatigue, nasal drainage with yellow phlegm, cough, and left ear pain. Examination revealed left-sided ear pain and pharyngeal irritation, with moderate throat and ear pain. She has a remote history of ear and sinus infections, and reports developing vaginal yeast infections after antibiotics. Rapid  COVID, influenza, and strep tests were negative.    Differential diagnosis includes, but is not limited to:  - Acute suppurative otitis media: Left ear pain with exam findings consistent with ear infection and a history of prior ear infections support this diagnosis.  - Acute pharyngitis: Sore throat, pharyngeal irritation, and negative rapid strep, COVID, and flu tests suggest a non-streptococcal pharyngitis, though bacterial etiology remains possible.    Acute suppurative otitis media and acute pharyngitis  - Prescribed amoxicillin  500 mg twice a day for 7 days  - Advised to take amoxicillin  with food and plenty of water  - Recommended probiotics to prevent gastrointestinal upset  - Patient education provided.  - Instructed to follow up with primary care if symptoms persist  - Advised to return to the emergency department if symptoms worsen    Prevention of antibiotic-associated vulvovaginal candidiasis  - Prescribed fluconazole  to prevent yeast infection       [name] was seen today for sore throat.    Diagnoses and all orders for this visit:    Acute suppurative otitis media of left ear without spontaneous rupture of tympanic  membrane, recurrence not specified  -     amoxicillin  (AMOXIL ) 500 MG capsule; Take 1 capsule (500 mg) by mouth 2 (two) times daily for 7 days    Sore throat  -     Culture, Throat; Future  -     Culture, Throat    Vaginal yeast infection  -     fluconazole  (DIFLUCAN ) 150 MG tablet; Take 1 tablet (150 mg) by mouth once for 1 dose         The indications for early follow-up with PCP and return to UC were discussed. Patient/family received education on the working diagnosis, diagnostic uncertainties, and proposed treatment plan. Indications for emergency evaluation in the ED were reviewed. Written and verbal discharge instructions were provided and discussed and all questions from the patient/family were addressed, with no apparent barriers.      Verbal consent obtained to record this visit.

## 2024-09-08 NOTE — Addendum Note (Signed)
 Addended by: GRADY HAMMANS, Valora Norell on: 09/08/2024 12:50 PM     Modules accepted: Orders

## 2024-09-10 ENCOUNTER — Ambulatory Visit (INDEPENDENT_AMBULATORY_CARE_PROVIDER_SITE_OTHER): Payer: Self-pay

## 2024-09-10 LAB — CULTURE, THROAT

## 2024-11-05 ENCOUNTER — Encounter: Payer: Self-pay | Admitting: Family Nurse Practitioner
